# Patient Record
Sex: Female | Born: 1960 | Race: Black or African American | Hispanic: No | Marital: Single | Smoking: Current every day smoker
Health system: Southern US, Community
[De-identification: ages and names within clinical notes are randomized; demographics above are authoritative.]

## PROBLEM LIST (undated history)

## (undated) DIAGNOSIS — E119 Type 2 diabetes mellitus without complications: Secondary | ICD-10-CM

## (undated) DIAGNOSIS — F1410 Cocaine abuse, uncomplicated: Secondary | ICD-10-CM

## (undated) DIAGNOSIS — M1990 Unspecified osteoarthritis, unspecified site: Secondary | ICD-10-CM

## (undated) DIAGNOSIS — F39 Unspecified mood [affective] disorder: Secondary | ICD-10-CM

## (undated) DIAGNOSIS — E039 Hypothyroidism, unspecified: Secondary | ICD-10-CM

## (undated) DIAGNOSIS — F1010 Alcohol abuse, uncomplicated: Secondary | ICD-10-CM

## (undated) DIAGNOSIS — F32A Depression, unspecified: Secondary | ICD-10-CM

## (undated) HISTORY — DX: Hypothyroidism, unspecified: E03.9

## (undated) HISTORY — DX: Unspecified mood (affective) disorder: F39

## (undated) HISTORY — DX: Cocaine abuse, uncomplicated: F14.10

## (undated) HISTORY — PX: OTHER SURGICAL HISTORY: SHX169

## (undated) HISTORY — DX: Alcohol abuse, uncomplicated: F10.10

---

## 1999-07-25 ENCOUNTER — Encounter: Admission: RE | Admit: 1999-07-25 | Discharge: 1999-07-25 | Payer: Self-pay | Admitting: Hematology and Oncology

## 2000-09-26 ENCOUNTER — Encounter: Admission: RE | Admit: 2000-09-26 | Discharge: 2000-09-26 | Payer: Self-pay | Admitting: Internal Medicine

## 2000-12-13 ENCOUNTER — Encounter: Admission: RE | Admit: 2000-12-13 | Discharge: 2000-12-13 | Payer: Self-pay | Admitting: Internal Medicine

## 2000-12-20 ENCOUNTER — Encounter: Admission: RE | Admit: 2000-12-20 | Discharge: 2000-12-20 | Payer: Self-pay | Admitting: Internal Medicine

## 2002-02-24 ENCOUNTER — Encounter: Admission: RE | Admit: 2002-02-24 | Discharge: 2002-02-24 | Payer: Self-pay | Admitting: Internal Medicine

## 2004-01-05 ENCOUNTER — Emergency Department (HOSPITAL_COMMUNITY): Admission: EM | Admit: 2004-01-05 | Discharge: 2004-01-05 | Payer: Self-pay | Admitting: Emergency Medicine

## 2004-06-28 ENCOUNTER — Emergency Department (HOSPITAL_COMMUNITY): Admission: EM | Admit: 2004-06-28 | Discharge: 2004-06-28 | Payer: Self-pay | Admitting: Emergency Medicine

## 2005-03-30 ENCOUNTER — Ambulatory Visit: Payer: Self-pay | Admitting: Internal Medicine

## 2005-07-13 ENCOUNTER — Inpatient Hospital Stay (HOSPITAL_COMMUNITY): Admission: EM | Admit: 2005-07-13 | Discharge: 2005-07-15 | Payer: Self-pay | Admitting: *Deleted

## 2005-08-15 ENCOUNTER — Ambulatory Visit: Payer: Self-pay | Admitting: Internal Medicine

## 2005-11-30 ENCOUNTER — Ambulatory Visit: Payer: Self-pay | Admitting: Internal Medicine

## 2006-03-28 ENCOUNTER — Ambulatory Visit: Payer: Self-pay | Admitting: Internal Medicine

## 2006-04-10 ENCOUNTER — Ambulatory Visit: Payer: Self-pay | Admitting: Internal Medicine

## 2006-04-12 DIAGNOSIS — R7611 Nonspecific reaction to tuberculin skin test without active tuberculosis: Secondary | ICD-10-CM

## 2006-04-12 DIAGNOSIS — F17200 Nicotine dependence, unspecified, uncomplicated: Secondary | ICD-10-CM

## 2006-04-12 DIAGNOSIS — R634 Abnormal weight loss: Secondary | ICD-10-CM

## 2006-04-12 DIAGNOSIS — E890 Postprocedural hypothyroidism: Secondary | ICD-10-CM | POA: Insufficient documentation

## 2006-06-03 ENCOUNTER — Ambulatory Visit: Payer: Self-pay | Admitting: Internal Medicine

## 2006-09-03 ENCOUNTER — Encounter (INDEPENDENT_AMBULATORY_CARE_PROVIDER_SITE_OTHER): Payer: Self-pay | Admitting: Internal Medicine

## 2006-09-03 ENCOUNTER — Ambulatory Visit: Payer: Self-pay | Admitting: Hospitalist

## 2006-09-03 LAB — CONVERTED CEMR LAB: Beta hcg, urine, semiquantitative: NEGATIVE

## 2006-09-04 ENCOUNTER — Telehealth (INDEPENDENT_AMBULATORY_CARE_PROVIDER_SITE_OTHER): Payer: Self-pay | Admitting: *Deleted

## 2006-09-04 LAB — CONVERTED CEMR LAB
ALT: 37 units/L — ABNORMAL HIGH (ref 0–35)
AST: 44 units/L — ABNORMAL HIGH (ref 0–37)
Albumin: 3.9 g/dL (ref 3.5–5.2)
Alkaline Phosphatase: 66 units/L (ref 39–117)
BUN: 8 mg/dL (ref 6–23)
CO2: 30 meq/L (ref 19–32)
Calcium: 9.3 mg/dL (ref 8.4–10.5)
Chloride: 102 meq/L (ref 96–112)
Creatinine, Ser: 0.59 mg/dL (ref 0.40–1.20)
Glucose, Bld: 75 mg/dL (ref 70–99)
HCT: 37.8 % (ref 36.0–46.0)
Hemoglobin: 12.6 g/dL (ref 12.0–15.0)
INR: 1.1 (ref 0.0–1.5)
MCHC: 33.3 g/dL (ref 30.0–36.0)
MCV: 86.6 fL (ref 78.0–100.0)
Platelets: 291 10*3/uL (ref 150–400)
Potassium: 4 meq/L (ref 3.5–5.3)
Prothrombin Time: 13.9 s (ref 11.6–15.2)
RBC: 4.36 M/uL (ref 3.87–5.11)
RDW: 12.3 % (ref 11.5–14.0)
Sodium: 141 meq/L (ref 135–145)
TSH: 0.017 microintl units/mL — ABNORMAL LOW (ref 0.350–5.50)
Total Bilirubin: 0.8 mg/dL (ref 0.3–1.2)
Total Protein: 6.8 g/dL (ref 6.0–8.3)
WBC: 12.2 10*3/uL — ABNORMAL HIGH (ref 4.0–10.5)
aPTT: 36 s (ref 24–37)

## 2006-12-10 ENCOUNTER — Telehealth: Payer: Self-pay | Admitting: *Deleted

## 2007-01-09 ENCOUNTER — Encounter: Payer: Self-pay | Admitting: Internal Medicine

## 2007-01-09 ENCOUNTER — Ambulatory Visit: Payer: Self-pay | Admitting: Internal Medicine

## 2007-01-09 LAB — CONVERTED CEMR LAB: TSH: 0.777 microintl units/mL (ref 0.350–5.50)

## 2007-01-21 ENCOUNTER — Telehealth: Payer: Self-pay | Admitting: *Deleted

## 2007-01-21 ENCOUNTER — Emergency Department (HOSPITAL_COMMUNITY): Admission: EM | Admit: 2007-01-21 | Discharge: 2007-01-21 | Payer: Self-pay | Admitting: Emergency Medicine

## 2007-01-29 ENCOUNTER — Ambulatory Visit: Payer: Self-pay | Admitting: *Deleted

## 2007-01-29 ENCOUNTER — Encounter (INDEPENDENT_AMBULATORY_CARE_PROVIDER_SITE_OTHER): Payer: Self-pay | Admitting: Internal Medicine

## 2007-05-21 ENCOUNTER — Encounter (INDEPENDENT_AMBULATORY_CARE_PROVIDER_SITE_OTHER): Payer: Self-pay | Admitting: Internal Medicine

## 2007-05-21 ENCOUNTER — Ambulatory Visit: Payer: Self-pay | Admitting: *Deleted

## 2007-05-22 LAB — CONVERTED CEMR LAB
ALT: 29 units/L (ref 0–35)
AST: 18 units/L (ref 0–37)
Albumin: 4.7 g/dL (ref 3.5–5.2)
Alkaline Phosphatase: 57 units/L (ref 39–117)
BUN: 13 mg/dL (ref 6–23)
CO2: 26 meq/L (ref 19–32)
Calcium: 9.6 mg/dL (ref 8.4–10.5)
Chloride: 105 meq/L (ref 96–112)
Cholesterol: 155 mg/dL (ref 0–200)
Creatinine, Ser: 0.68 mg/dL (ref 0.40–1.20)
Glucose, Bld: 56 mg/dL — ABNORMAL LOW (ref 70–99)
HDL: 69 mg/dL (ref >39)
LDL Cholesterol: 73 mg/dL (ref 0–99)
Potassium: 4.5 meq/L (ref 3.5–5.3)
Sodium: 144 meq/L (ref 135–145)
TSH: 0.379 microintl units/mL (ref 0.350–5.50)
Total Bilirubin: 0.5 mg/dL (ref 0.3–1.2)
Total CHOL/HDL Ratio: 2.2
Total Protein: 7.4 g/dL (ref 6.0–8.3)
Triglycerides: 63 mg/dL (ref <150)
VLDL: 13 mg/dL (ref 0–40)

## 2007-06-10 ENCOUNTER — Telehealth (INDEPENDENT_AMBULATORY_CARE_PROVIDER_SITE_OTHER): Payer: Self-pay | Admitting: Internal Medicine

## 2007-09-18 ENCOUNTER — Ambulatory Visit: Payer: Self-pay | Admitting: Internal Medicine

## 2007-09-18 ENCOUNTER — Encounter (INDEPENDENT_AMBULATORY_CARE_PROVIDER_SITE_OTHER): Payer: Self-pay | Admitting: Internal Medicine

## 2007-09-18 LAB — CONVERTED CEMR LAB: TSH: 0.724 microintl units/mL (ref 0.350–5.50)

## 2007-12-22 ENCOUNTER — Encounter (INDEPENDENT_AMBULATORY_CARE_PROVIDER_SITE_OTHER): Payer: Self-pay | Admitting: Internal Medicine

## 2008-07-07 ENCOUNTER — Encounter (INDEPENDENT_AMBULATORY_CARE_PROVIDER_SITE_OTHER): Payer: Self-pay | Admitting: Internal Medicine

## 2008-09-24 ENCOUNTER — Encounter (INDEPENDENT_AMBULATORY_CARE_PROVIDER_SITE_OTHER): Payer: Self-pay | Admitting: Internal Medicine

## 2008-09-24 ENCOUNTER — Ambulatory Visit: Payer: Self-pay | Admitting: *Deleted

## 2008-09-27 LAB — CONVERTED CEMR LAB
ALT: 26 units/L (ref 0–35)
AST: 37 units/L (ref 0–37)
Albumin: 4.6 g/dL (ref 3.5–5.2)
Alkaline Phosphatase: 77 units/L (ref 39–117)
BUN: 12 mg/dL (ref 6–23)
CO2: 22 meq/L (ref 19–32)
Calcium: 9.1 mg/dL (ref 8.4–10.5)
Chloride: 109 meq/L (ref 96–112)
Cholesterol: 161 mg/dL (ref 0–200)
Creatinine, Ser: 0.87 mg/dL (ref 0.40–1.20)
GFR calc Af Amer: >60 mL/min (ref >60)
GFR calc non Af Amer: >60 mL/min (ref >60)
Glucose, Bld: 91 mg/dL (ref 70–99)
HDL: 59 mg/dL (ref >39)
LDL Cholesterol: 85 mg/dL (ref 0–99)
Potassium: 4 meq/L (ref 3.5–5.3)
Sodium: 147 meq/L — ABNORMAL HIGH (ref 135–145)
TSH: 1.434 microintl units/mL (ref 0.350–4.500)
Total Bilirubin: 0.4 mg/dL (ref 0.3–1.2)
Total CHOL/HDL Ratio: 2.7
Total Protein: 7.6 g/dL (ref 6.0–8.3)
Triglycerides: 84 mg/dL (ref <150)
VLDL: 17 mg/dL (ref 0–40)

## 2008-11-30 ENCOUNTER — Encounter (INDEPENDENT_AMBULATORY_CARE_PROVIDER_SITE_OTHER): Payer: Self-pay | Admitting: Internal Medicine

## 2008-12-04 ENCOUNTER — Emergency Department (HOSPITAL_COMMUNITY): Admission: EM | Admit: 2008-12-04 | Discharge: 2008-12-04 | Payer: Self-pay | Admitting: Emergency Medicine

## 2008-12-06 ENCOUNTER — Encounter (INDEPENDENT_AMBULATORY_CARE_PROVIDER_SITE_OTHER): Payer: Self-pay | Admitting: Internal Medicine

## 2008-12-16 ENCOUNTER — Emergency Department (HOSPITAL_COMMUNITY): Admission: EM | Admit: 2008-12-16 | Discharge: 2008-12-16 | Payer: Self-pay | Admitting: Emergency Medicine

## 2008-12-16 ENCOUNTER — Encounter: Payer: Self-pay | Admitting: Licensed Clinical Social Worker

## 2008-12-25 ENCOUNTER — Encounter (INDEPENDENT_AMBULATORY_CARE_PROVIDER_SITE_OTHER): Payer: Self-pay | Admitting: Internal Medicine

## 2008-12-25 ENCOUNTER — Ambulatory Visit: Payer: Self-pay | Admitting: *Deleted

## 2008-12-25 ENCOUNTER — Inpatient Hospital Stay (HOSPITAL_COMMUNITY): Admission: EM | Admit: 2008-12-25 | Discharge: 2008-12-27 | Payer: Self-pay | Admitting: Emergency Medicine

## 2008-12-27 ENCOUNTER — Encounter: Payer: Self-pay | Admitting: Internal Medicine

## 2009-02-21 ENCOUNTER — Emergency Department (HOSPITAL_COMMUNITY): Admission: EM | Admit: 2009-02-21 | Discharge: 2009-02-21 | Payer: Self-pay | Admitting: Emergency Medicine

## 2009-10-06 ENCOUNTER — Telehealth: Payer: Self-pay | Admitting: Internal Medicine

## 2010-02-13 ENCOUNTER — Ambulatory Visit: Payer: Self-pay | Admitting: Internal Medicine

## 2010-02-13 DIAGNOSIS — N898 Other specified noninflammatory disorders of vagina: Secondary | ICD-10-CM | POA: Insufficient documentation

## 2010-02-13 DIAGNOSIS — C519 Malignant neoplasm of vulva, unspecified: Secondary | ICD-10-CM | POA: Insufficient documentation

## 2010-02-13 LAB — CONVERTED CEMR LAB
Candida species: NEGATIVE
Gardnerella vaginalis: POSITIVE — AB
Pap Smear: NEGATIVE
TSH: 1.013 microintl units/mL (ref 0.350–4.5)

## 2010-02-15 ENCOUNTER — Telehealth: Payer: Self-pay | Admitting: *Deleted

## 2010-02-15 ENCOUNTER — Encounter: Payer: Self-pay | Admitting: Internal Medicine

## 2010-02-15 ENCOUNTER — Telehealth: Payer: Self-pay | Admitting: Licensed Clinical Social Worker

## 2010-02-15 LAB — CONVERTED CEMR LAB
Chlamydia, DNA Probe: NEGATIVE
GC Probe Amp, Genital: NEGATIVE

## 2010-02-21 ENCOUNTER — Ambulatory Visit: Admission: RE | Admit: 2010-02-21 | Discharge: 2010-02-21 | Payer: Self-pay | Admitting: Gynecologic Oncology

## 2010-03-14 ENCOUNTER — Ambulatory Visit (HOSPITAL_COMMUNITY): Admission: RE | Admit: 2010-03-14 | Discharge: 2010-03-14 | Payer: Self-pay | Admitting: Gynecologic Oncology

## 2010-07-05 ENCOUNTER — Ambulatory Visit
Admission: RE | Admit: 2010-07-05 | Discharge: 2010-07-05 | Payer: Self-pay | Source: Home / Self Care | Attending: Gynecologic Oncology | Admitting: Gynecologic Oncology

## 2010-07-12 ENCOUNTER — Ambulatory Visit (HOSPITAL_COMMUNITY)
Admission: RE | Admit: 2010-07-12 | Discharge: 2010-07-12 | Payer: Self-pay | Source: Home / Self Care | Attending: Gynecologic Oncology | Admitting: Gynecologic Oncology

## 2010-07-17 LAB — GLUCOSE, CAPILLARY: Glucose-Capillary: 83 mg/dL (ref 70–99)

## 2010-08-03 NOTE — Miscellaneous (Signed)
Summary: Orders Update  Clinical Lists Changes  Orders: Added new Referral order of Social Work Referral (Social ) - Signed 

## 2010-08-03 NOTE — Progress Notes (Signed)
Summary: Soc. 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  I gave her the phone number for [facility] to set up an appointment and advised her that I would call next week to make sure she got appmt.

## 2010-08-03 NOTE — Progress Notes (Signed)
Summary: RTC/ hla  Phone Note Call from Patient   Summary of Call: pt left message to call her, i called back and she stated someone had already called her, ask if she had any questions, she said no, very odd conversation??? Initial call taken by: Marin Roberts RN,  February 15, 2010 5:05 PM
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
## 2010-08-03 NOTE — Progress Notes (Signed)
Summary: refill/gg  Phone Note Refill Request  on October 06, 2009 11:23 AM  Refills Requested: Medication #1:  LEVOTHROID 50 MCG TABS take one tab by mouth once daily.   Last Refilled: 08/27/2009  Method Requested: Electronic Initial call taken by: Merrie Roof RN,  October 06, 2009 11:24 AM  Follow-up for Phone Call        Refill approved-nurse to complete    Prescriptions: LEVOTHROID 50 MCG TABS (LEVOTHYROXINE SODIUM) take one tab by mouth once daily  #31 x 6   Entered and Authorized by:   Vassie Loll MD   Signed by:   Vassie Loll MD on 10/06/2009   Method used:   Electronically to        [facility] [phone]* (retail)       [address]       [place], [place]  [address]       Ph: [phone]       Fax: [phone]   RxID:   [id number]

## 2010-08-18 ENCOUNTER — Other Ambulatory Visit: Payer: Self-pay | Admitting: *Deleted

## 2010-08-21 MED ORDER — LEVOTHYROXINE SODIUM 50 MCG PO TABS: 50.0000 ug | ORAL_TABLET | Freq: Every day | ORAL | Status: DC

## 2010-09-14 LAB — DIFFERENTIAL
Basophils Absolute: 0 10*3/uL (ref 0.0–0.1)
Basophils Relative: 0 % (ref 0–1)
Eosinophils Absolute: 0.1 10*3/uL (ref 0.0–0.7)
Eosinophils Relative: 2 % (ref 0–5)
Lymphocytes Relative: 37 % (ref 12–46)
Lymphs Abs: 2.3 10*3/uL (ref 0.7–4.0)
Monocytes Absolute: 0.3 10*3/uL (ref 0.1–1.0)
Monocytes Relative: 4 % (ref 3–12)
Neutro Abs: 3.5 10*3/uL (ref 1.7–7.7)
Neutrophils Relative %: 57 % (ref 43–77)

## 2010-09-14 LAB — RAPID URINE DRUG SCREEN, HOSP PERFORMED
Amphetamines: NOT DETECTED
Barbiturates: NOT DETECTED
Benzodiazepines: NOT DETECTED
Cocaine: NOT DETECTED
Opiates: NOT DETECTED
Tetrahydrocannabinol: NOT DETECTED

## 2010-09-14 LAB — CBC
HCT: 39.1 % (ref 36.0–46.0)
Hemoglobin: 13.2 g/dL (ref 12.0–15.0)
MCH: 29.5 pg (ref 26.0–34.0)
MCHC: 33.8 g/dL (ref 30.0–36.0)
MCV: 87.3 fL (ref 78.0–100.0)
Platelets: 261 10*3/uL (ref 150–400)
RBC: 4.48 MIL/uL (ref 3.87–5.11)
RDW: 12.8 % (ref 11.5–15.5)
WBC: 6.2 10*3/uL (ref 4.0–10.5)

## 2010-09-14 LAB — BASIC METABOLIC PANEL
BUN: 5 mg/dL — ABNORMAL LOW (ref 6–23)
CO2: 29 mEq/L (ref 19–32)
Calcium: 9.3 mg/dL (ref 8.4–10.5)
Chloride: 109 mEq/L (ref 96–112)
Creatinine, Ser: 0.66 mg/dL (ref 0.4–1.2)
GFR calc Af Amer: >60 mL/min (ref >60)
GFR calc non Af Amer: >60 mL/min (ref >60)
Glucose, Bld: 85 mg/dL (ref 70–99)
Potassium: 3.3 mEq/L — ABNORMAL LOW (ref 3.5–5.1)
Sodium: 145 mEq/L (ref 135–145)

## 2010-09-14 LAB — SURGICAL PCR SCREEN
MRSA, PCR: POSITIVE — AB
Staphylococcus aureus: POSITIVE — AB

## 2010-10-09 LAB — CBC
HCT: 31.7 % — ABNORMAL LOW (ref 36.0–46.0)
HCT: 39.3 % (ref 36.0–46.0)
Hemoglobin: 10.5 g/dL — ABNORMAL LOW (ref 12.0–15.0)
Hemoglobin: 10.8 g/dL — ABNORMAL LOW (ref 12.0–15.0)
Hemoglobin: 13.1 g/dL (ref 12.0–15.0)
MCHC: 33.3 g/dL (ref 30.0–36.0)
MCHC: 33.3 g/dL (ref 30.0–36.0)
MCV: 87.8 fL (ref 78.0–100.0)
MCV: 88.2 fL (ref 78.0–100.0)
MCV: 88.8 fL (ref 78.0–100.0)
Platelets: 309 10*3/uL (ref 150–400)
RBC: 3.57 MIL/uL — ABNORMAL LOW (ref 3.87–5.11)
RBC: 3.69 MIL/uL — ABNORMAL LOW (ref 3.87–5.11)
RBC: 4.48 MIL/uL (ref 3.87–5.11)
RDW: 13.3 % (ref 11.5–15.5)
WBC: 10.1 10*3/uL (ref 4.0–10.5)
WBC: 5.4 10*3/uL (ref 4.0–10.5)
WBC: 6.7 10*3/uL (ref 4.0–10.5)

## 2010-10-09 LAB — URINALYSIS, ROUTINE W REFLEX MICROSCOPIC
Bilirubin Urine: NEGATIVE
Nitrite: NEGATIVE
Nitrite: NEGATIVE
Protein, ur: NEGATIVE mg/dL
Specific Gravity, Urine: 1.01 (ref 1.005–1.030)
Urobilinogen, UA: 0.2 mg/dL (ref 0.0–1.0)
Urobilinogen, UA: 0.2 mg/dL (ref 0.0–1.0)
pH: 6.5 (ref 5.0–8.0)

## 2010-10-09 LAB — COMPREHENSIVE METABOLIC PANEL
ALT: 26 U/L (ref 0–35)
AST: 23 U/L (ref 0–37)
CO2: 25 mEq/L (ref 19–32)
Chloride: 104 mEq/L (ref 96–112)
GFR calc Af Amer: >60 mL/min (ref >60)
GFR calc non Af Amer: >60 mL/min (ref >60)
Glucose, Bld: 103 mg/dL — ABNORMAL HIGH (ref 70–99)
Sodium: 136 mEq/L (ref 135–145)
Total Bilirubin: 0.5 mg/dL (ref 0.3–1.2)

## 2010-10-09 LAB — SYNOVIAL CELL COUNT + DIFF, W/ CRYSTALS
Crystals, Fluid: NONE SEEN
WBC, Synovial: UNDETERMINED /mm3 (ref 0–200)

## 2010-10-09 LAB — POCT I-STAT, CHEM 8
BUN: 7 mg/dL (ref 6–23)
Calcium, Ion: 1.18 mmol/L (ref 1.12–1.32)
HCT: 41 % (ref 36.0–46.0)
Hemoglobin: 13.9 g/dL (ref 12.0–15.0)
Sodium: 140 mEq/L (ref 135–145)
TCO2: 26 mmol/L (ref 0–100)

## 2010-10-09 LAB — ETHANOL
Alcohol, Ethyl (B): <5 mg/dL (ref 0–10)
Alcohol, Ethyl (B): 225 mg/dL — ABNORMAL HIGH (ref 0–10)

## 2010-10-09 LAB — DIFFERENTIAL
Basophils Absolute: 0 10*3/uL (ref 0.0–0.1)
Basophils Relative: 0 % (ref 0–1)
Eosinophils Absolute: 0 10*3/uL (ref 0.0–0.7)
Eosinophils Relative: 0 % (ref 0–5)
Lymphocytes Relative: 19 % (ref 12–46)
Lymphs Abs: 1.9 10*3/uL (ref 0.7–4.0)
Monocytes Absolute: 0.5 10*3/uL (ref 0.1–1.0)
Monocytes Relative: 5 % (ref 3–12)
Neutro Abs: 7.6 10*3/uL (ref 1.7–7.7)
Neutrophils Relative %: 75 % (ref 43–77)

## 2010-10-09 LAB — SEDIMENTATION RATE: Sed Rate: 44 mm/hr — ABNORMAL HIGH (ref 0–22)

## 2010-10-09 LAB — BASIC METABOLIC PANEL
BUN: 7 mg/dL (ref 6–23)
Chloride: 108 mEq/L (ref 96–112)
GFR calc non Af Amer: >60 mL/min (ref >60)
Potassium: 2.8 mEq/L — ABNORMAL LOW (ref 3.5–5.1)
Sodium: 144 mEq/L (ref 135–145)

## 2010-10-09 LAB — LIPID PANEL: VLDL: 9 mg/dL (ref 0–40)

## 2010-10-09 LAB — IRON AND TIBC
Iron: 26 ug/dL — ABNORMAL LOW (ref 42–135)
Saturation Ratios: 9 % — ABNORMAL LOW (ref 20–55)
TIBC: 283 ug/dL (ref 250–470)
UIBC: 257 ug/dL

## 2010-10-09 LAB — RAPID URINE DRUG SCREEN, HOSP PERFORMED
Amphetamines: NOT DETECTED
Barbiturates: NOT DETECTED
Benzodiazepines: NOT DETECTED
Cocaine: POSITIVE — AB
Opiates: POSITIVE — AB
Tetrahydrocannabinol: NOT DETECTED
Tetrahydrocannabinol: NOT DETECTED

## 2010-10-09 LAB — WOUND CULTURE

## 2010-10-09 LAB — CULTURE, BLOOD (ROUTINE X 2)

## 2010-10-09 LAB — T4, FREE: Free T4: 1.07 ng/dL (ref 0.80–1.80)

## 2010-10-09 LAB — BODY FLUID CULTURE: Gram Stain: NONE SEEN

## 2010-10-09 LAB — URINE MICROSCOPIC-ADD ON

## 2010-10-09 LAB — TSH: TSH: 0.331 u[IU]/mL — ABNORMAL LOW (ref 0.350–4.500)

## 2010-10-09 LAB — VITAMIN B12: Vitamin B-12: 488 pg/mL (ref 211–911)

## 2010-10-18 ENCOUNTER — Ambulatory Visit: Payer: Self-pay | Admitting: Gynecologic Oncology
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
## 2010-11-21 ENCOUNTER — Encounter: Payer: Self-pay | Admitting: Internal Medicine

## 2010-11-23 ENCOUNTER — Encounter: Payer: Self-pay | Admitting: Internal Medicine

## 2010-11-23 DIAGNOSIS — F1020 Alcohol dependence, uncomplicated: Secondary | ICD-10-CM | POA: Insufficient documentation

## 2010-11-23 DIAGNOSIS — E039 Hypothyroidism, unspecified: Secondary | ICD-10-CM | POA: Insufficient documentation

## 2010-11-23 DIAGNOSIS — F1010 Alcohol abuse, uncomplicated: Secondary | ICD-10-CM | POA: Insufficient documentation

## 2010-12-20 ENCOUNTER — Other Ambulatory Visit: Payer: Self-pay | Admitting: Gynecologic Oncology

## 2010-12-20 ENCOUNTER — Other Ambulatory Visit (HOSPITAL_COMMUNITY)
Admission: RE | Admit: 2010-12-20 | Discharge: 2010-12-20 | Disposition: A | Discharge status: Home / Self Care | Payer: Self-pay | Source: Ambulatory Visit | Attending: Gynecologic Oncology | Admitting: Gynecologic Oncology

## 2010-12-20 ENCOUNTER — Ambulatory Visit: Payer: Self-pay | Attending: Gynecologic Oncology | Admitting: Gynecologic Oncology

## 2010-12-20 DIAGNOSIS — Z8540 Personal history of malignant neoplasm of unspecified female genital organ: Secondary | ICD-10-CM | POA: Insufficient documentation

## 2010-12-20 DIAGNOSIS — C519 Malignant neoplasm of vulva, unspecified: Secondary | ICD-10-CM | POA: Insufficient documentation

## 2010-12-21 NOTE — Consult Note (Signed)
NAMEMEMORY, HEINRICHS              ACCOUNT NO.:  [id number]  MEDICAL RECORD NO.:  [id number]  LOCATION:  GYN                          FACILITY:  [facility]  PHYSICIAN:  Mylen Mangan A. Duard Brady, MD    DATE OF BIRTH:  [birth year]  DATE OF CONSULTATION:  12/20/2010 DATE OF DISCHARGE:                                CONSULTATION   [name] is a 50 year old with a stage IB vulvar carcinoma.  She initially presented to [place] in August 2011 with a 2 x 3 cm fungating mass on the labia minora.  The patient refused biopsy, underwent radical vulvectomy in September 2011.  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  Depth of invasion was 8 mm.  There was no LVSI.  The patient was scheduled to undergo a PET scan and bilateral groin node dissection, however, she was incarcerated.  We did make contact with the correctional facility to see if we could proceed with surgery, but it was not approved.  She subsequently got out of the correctional facility approximately 6 months later to re-establish care.  We got a PET scan at that time in January 2012 that revealed no evidence of metastatic disease and the discussion was to proceed with interval followup as the benefit of bilateral groin dissection 6 months after her initial surgery is somewhat limited.  She comes in today for followup.  She is overall doing quite well.  She continues on disability.  Denies any bleeding or soreness or pain.  She continues to occasionally drink some alcohol.  She has used crack.  She otherwise has no complaints.  REVIEW OF SYSTEMS:  She denies any chest pain, short of breath, nausea, vomiting, fever, chills, headaches, visual changes, unintentional weight loss or weight gain.  MEDICATIONS:  Synthroid.  PHYSICAL EXAMINATION:  VITAL SIGNS:  Weight 141 pounds, blood pressure 110/60, respirations 14, temperature 97.6. GENERAL:  A well-nourished, well-developed female,  in no acute distress. NECK:  Supple.  There is no lymphadenopathy, no thyromegaly. LUNGS:  Clear to auscultation bilaterally. CARDIOVASCULAR:  Regular rate and rhythm. ABDOMEN:  Soft, nontender, nondistended.  There are no palpable masses or hepatosplenomegaly.  Groins are negative for adenopathy. EXTREMITIES:  There is no edema. PELVIC:  External genitalia is notable for surgical excision of the labia minora and clitoris.  The area is otherwise well healed.  There are no visible lesions.  The vagina is somewhat atrophic.  Cervix is visualized, it is nulliparous.  There are no visible lesions.  ThinPrep Pap was submitted without difficulty.  Bimanual examination of the corpus is of normal size, shape, consistency.  There were no adnexal masses.  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  PLAN: 1. We will follow up the results for Pap smear from today. 2. She will return to see me in 4 months and will follow up with her     other physicians as scheduled.     Tykisha Areola A. Duard Brady, MD     PAG/MEDQ  D:  12/20/2010  T:  12/21/2010  Job:  [id number]  cc:  Telford Nab, R.N. [address] [place], [place] [address]  Tilford Pillar, MD  Primary physician [facility]  Electronically Signed by Cleda Mccreedy MD on 12/21/2010 02:53:27 PM

## 2011-04-16 LAB — URINALYSIS, ROUTINE W REFLEX MICROSCOPIC
Hgb urine dipstick: NEGATIVE
Nitrite: NEGATIVE
Protein, ur: NEGATIVE
Specific Gravity, Urine: 1.022
Urobilinogen, UA: 0.2

## 2011-04-16 LAB — POCT PREGNANCY, URINE: Preg Test, Ur: NEGATIVE

## 2011-04-16 LAB — RAPID URINE DRUG SCREEN, HOSP PERFORMED
Barbiturates: NOT DETECTED
Opiates: NOT DETECTED

## 2011-04-18 ENCOUNTER — Ambulatory Visit: Payer: Self-pay | Admitting: Gynecologic Oncology

## 2011-06-13 ENCOUNTER — Ambulatory Visit: Payer: Self-pay | Admitting: Gynecologic Oncology

## 2011-09-03 ENCOUNTER — Other Ambulatory Visit: Payer: Self-pay | Admitting: Internal Medicine

## 2011-09-04 ENCOUNTER — Other Ambulatory Visit: Payer: Self-pay | Admitting: *Deleted

## 2011-09-12 ENCOUNTER — Ambulatory Visit (INDEPENDENT_AMBULATORY_CARE_PROVIDER_SITE_OTHER): Payer: Self-pay | Admitting: Internal Medicine

## 2011-09-12 ENCOUNTER — Encounter: Payer: Self-pay | Admitting: Internal Medicine

## 2011-09-12 VITALS — BP 149/86 | HR 113 | Temp 97.8°F | Ht 62.0 in | Wt 150.0 lb

## 2011-09-12 DIAGNOSIS — E039 Hypothyroidism, unspecified: Secondary | ICD-10-CM

## 2011-09-12 LAB — COMPLETE METABOLIC PANEL WITH GFR
ALT: 39 U/L — ABNORMAL HIGH (ref 0–35)
Albumin: 4.7 g/dL (ref 3.5–5.2)
Alkaline Phosphatase: 80 U/L (ref 39–117)
CO2: 25 mEq/L (ref 19–32)
Calcium: 9.7 mg/dL (ref 8.4–10.5)
Chloride: 106 mEq/L (ref 96–112)
GFR, Est African American: >89 mL/min
GFR, Est Non African American: >89 mL/min
Glucose, Bld: 96 mg/dL (ref 70–99)
Potassium: 4.3 mEq/L (ref 3.5–5.3)
Sodium: 141 mEq/L (ref 135–145)
Total Bilirubin: 0.3 mg/dL (ref 0.3–1.2)
Total Protein: 7.2 g/dL (ref 6.0–8.3)

## 2011-09-12 LAB — CBC
HCT: 39.2 % (ref 36.0–46.0)
MCV: 87.7 fL (ref 78.0–100.0)
Platelets: 331 10*3/uL (ref 150–400)
RBC: 4.47 MIL/uL (ref 3.87–5.11)
RDW: 14.4 % (ref 11.5–15.5)
WBC: 15.7 10*3/uL — ABNORMAL HIGH (ref 4.0–10.5)

## 2011-09-12 LAB — TSH: TSH: 5.664 u[IU]/mL — ABNORMAL HIGH (ref 0.350–4.500)

## 2011-09-12 LAB — LIPID PANEL
HDL: 66 mg/dL (ref >39)
LDL Cholesterol: 112 mg/dL — ABNORMAL HIGH (ref 0–99)
VLDL: 13 mg/dL (ref 0–40)

## 2011-09-12 MED ORDER — LEVOTHYROXINE SODIUM 50 MCG PO TABS: 50.0000 ug | ORAL_TABLET | Freq: Every day | ORAL | Status: DC

## 2011-09-12 NOTE — Assessment & Plan Note (Signed)
Last TSH was 1.01 in August of 2011.  She was tachycardic in 113 on arrival but repeat was 101.   -Will refill Levothyroxine 50 mcg for now until lab results come back -Get TSH level today

## 2011-09-12 NOTE — Patient Instructions (Signed)
Resume taking Levothyroxine 50 mcg one tablet every morning. Get labs today and I will call you with any abnormal lab results and medication adjustment if neeeded Follow up in 6 months

## 2011-09-12 NOTE — Progress Notes (Signed)
HPI: [name] is a 51 yo W with PMH of hypothyroidism presents today for follow up.  She ran out of her meds for about 1 weeks.  Gaining weight from 113 to 150 pounds since 2011. Denies any dry skin, brittle, depression.  + good appetite.  She has been taking Levothyroxine for many years after radiation.  No other complaints. Denies illicits/alcohol.  ROS: as per HPI  PE: General: alert, well-developed, and cooperative to examination.  Neck: supple, full ROM, no thyromegaly, no JVD Lungs: normal respiratory effort, no accessory muscle use, normal breath sounds, no crackles, and no wheezes. Heart: normal rate, regular rhythm, no murmur, no gallop, and no rub.  Abdomen: soft, non-tender, normal bowel sounds, no distention, no guarding, no rebound tenderness Msk: no joint swelling, no joint warmth, and no redness over joints.  Pulses: 2+ DP/PT pulses bilaterally Extremities: No cyanosis, clubbing, edema Neurologic: alert & oriented X3, cranial nerves II-XII intact, strength normal in all extremities, sensation intact to light touch, and gait normal.

## 2012-01-07 ENCOUNTER — Other Ambulatory Visit: Payer: Self-pay | Admitting: *Deleted

## 2012-01-07 NOTE — Telephone Encounter (Signed)
Pt states dosage was increased to .

## 2012-01-08 MED ORDER — LEVOTHYROXINE SODIUM 50 MCG PO TABS: 75.0000 ug | ORAL_TABLET | Freq: Every day | ORAL | Status: DC

## 2012-01-08 NOTE — Telephone Encounter (Signed)
Patient will need to come back so we can repeat her TSH and free T4 before I refill her meds.  Will refill 1 month supply until she can come for office visit/labwork. Last labs were abnormal so we need to make sure that the of Levothyroxine is adequate for her hypothyroidism.

## 2012-01-08 NOTE — Telephone Encounter (Signed)
Patient will need to come in for TSH/free T4

## 2012-01-09 NOTE — Telephone Encounter (Signed)
Appt scheduled 7/15 for labs.

## 2012-01-10 ENCOUNTER — Other Ambulatory Visit: Payer: Self-pay | Admitting: Internal Medicine

## 2012-01-10 DIAGNOSIS — E039 Hypothyroidism, unspecified: Secondary | ICD-10-CM

## 2012-01-21 ENCOUNTER — Other Ambulatory Visit (INDEPENDENT_AMBULATORY_CARE_PROVIDER_SITE_OTHER): Payer: Self-pay

## 2012-01-21 DIAGNOSIS — E039 Hypothyroidism, unspecified: Secondary | ICD-10-CM

## 2012-01-21 LAB — T4, FREE: Free T4: 1.38 ng/dL (ref 0.80–1.80)

## 2012-02-04 ENCOUNTER — Other Ambulatory Visit: Payer: Self-pay | Admitting: *Deleted

## 2012-02-04 MED ORDER — LEVOTHYROXINE SODIUM 50 MCG PO TABS: 75.0000 ug | ORAL_TABLET | Freq: Every day | ORAL | Status: DC

## 2012-03-04 ENCOUNTER — Other Ambulatory Visit: Payer: Self-pay | Admitting: *Deleted

## 2012-03-04 MED ORDER — LEVOTHYROXINE SODIUM 50 MCG PO TABS: 75.0000 ug | ORAL_TABLET | Freq: Every day | ORAL | Status: DC

## 2012-04-08 ENCOUNTER — Other Ambulatory Visit: Payer: Self-pay | Admitting: *Deleted

## 2012-04-08 MED ORDER — LEVOTHYROXINE SODIUM 50 MCG PO TABS: 75.0000 ug | ORAL_TABLET | Freq: Every day | ORAL | Status: DC

## 2012-05-13 ENCOUNTER — Other Ambulatory Visit: Payer: Self-pay | Admitting: *Deleted

## 2012-05-13 MED ORDER — LEVOTHYROXINE SODIUM 50 MCG PO TABS: 75.0000 ug | ORAL_TABLET | Freq: Every day | ORAL | Status: DC

## 2012-09-02 ENCOUNTER — Other Ambulatory Visit: Payer: Self-pay | Admitting: *Deleted

## 2012-09-02 MED ORDER — LEVOTHYROXINE SODIUM 50 MCG PO TABS: 75.0000 ug | ORAL_TABLET | Freq: Every day | ORAL | Status: DC

## 2012-11-03 ENCOUNTER — Other Ambulatory Visit: Payer: Self-pay | Admitting: *Deleted

## 2012-11-04 MED ORDER — LEVOTHYROXINE SODIUM 50 MCG PO TABS: 75.0000 ug | ORAL_TABLET | Freq: Every day | ORAL | Status: DC

## 2012-11-04 NOTE — Telephone Encounter (Signed)
She will need blood work for her TSH soon in the next 1-3 months, her last TSH was in 12/2011.

## 2012-11-06 ENCOUNTER — Ambulatory Visit (INDEPENDENT_AMBULATORY_CARE_PROVIDER_SITE_OTHER): Payer: Self-pay | Admitting: Internal Medicine

## 2012-11-06 ENCOUNTER — Encounter: Payer: Self-pay | Admitting: Internal Medicine

## 2012-11-06 VITALS — BP 106/65 | HR 65 | Temp 97.4°F | Ht 62.25 in | Wt 146.6 lb

## 2012-11-06 DIAGNOSIS — E039 Hypothyroidism, unspecified: Secondary | ICD-10-CM

## 2012-11-06 LAB — TSH: TSH: 5.603 u[IU]/mL — ABNORMAL HIGH (ref 0.350–4.500)

## 2012-11-06 MED ORDER — LEVOTHYROXINE SODIUM 50 MCG PO TABS: 75.0000 ug | ORAL_TABLET | Freq: Every day | ORAL | Status: DC

## 2012-11-06 NOTE — Progress Notes (Signed)
I discussed this case with Dr. Patel soon after the patient visit. I have read the documentation and I agree with the plan of care. Please see the resident note for details of management.  

## 2012-11-06 NOTE — Progress Notes (Signed)
  Subjective:    Patient ID: [name], female    DOB: Aug 31, 1960, 52 y.o.   MRN: [id number]  HPI patient is a pleasant 52 year woman with hypothyroidism, and other problems as problem list who comes to the clinic for medication refill.  She was seen in our clinic last year for hypothyroidism. Her last TSH was 0.72 in July 2013. She was supposed to be on 75 mcg daily of Synthroid per her chart but she is taking 50 mcg daily. She feels perfectly all right. She denies any fever, chills, nausea vomiting abdominal pain, chest pain, diarrhea, headache, palpitations.    Review of Systems    as per history of present illness. Objective:   Physical Exam  General: NAD HEENT: PERRL, EOMI, no scleral icterus Cardiac: S1, S2, RRR, no rubs, murmurs or gallops Pulm: clear to auscultation bilaterally, moving normal volumes of air Abd: soft, nontender, nondistended, BS present Ext: warm and well perfused, no pedal edema Neuro: alert and oriented X3, cranial nerves II-XII grossly intact       Assessment & Plan:

## 2012-11-06 NOTE — Patient Instructions (Signed)
Please make a followup appointment as needed.  Get the lab test for thyroid done today. If anything needs to be changed, I will call you after that result.  Meanwhile start taking Synthroid 50 mcg one and half tablets daily. You will have refill with 45 pills every month.

## 2012-11-06 NOTE — Assessment & Plan Note (Signed)
Check TSH today. Last TSH 0.72 in July 2013.  Unclear if patient is taking prescribed dose of Synthroid.  - Prescription for 75 mcg daily of Synthroid sent to pharmacy.

## 2012-11-27 ENCOUNTER — Ambulatory Visit: Payer: Self-pay | Admitting: Internal Medicine

## 2013-11-30 ENCOUNTER — Other Ambulatory Visit: Payer: Self-pay | Admitting: *Deleted

## 2013-11-30 DIAGNOSIS — E039 Hypothyroidism, unspecified: Secondary | ICD-10-CM

## 2013-11-30 MED ORDER — LEVOTHYROXINE SODIUM 50 MCG PO TABS: 75.0000 ug | ORAL_TABLET | Freq: Every day | ORAL | Status: DC

## 2013-11-30 NOTE — Telephone Encounter (Signed)
Informed pt to call back tomorrow to schedule an appt.

## 2013-12-01 NOTE — Telephone Encounter (Signed)
Message sent to front desk also.

## 2013-12-28 ENCOUNTER — Ambulatory Visit (INDEPENDENT_AMBULATORY_CARE_PROVIDER_SITE_OTHER): Payer: Self-pay | Admitting: Internal Medicine

## 2013-12-28 ENCOUNTER — Encounter: Payer: Self-pay | Admitting: Internal Medicine

## 2013-12-28 VITALS — BP 111/71 | HR 67 | Temp 98.1°F | Wt 153.2 lb

## 2013-12-28 DIAGNOSIS — F1011 Alcohol abuse, in remission: Secondary | ICD-10-CM

## 2013-12-28 DIAGNOSIS — Z0000 Encounter for general adult medical examination without abnormal findings: Secondary | ICD-10-CM

## 2013-12-28 DIAGNOSIS — M25569 Pain in unspecified knee: Secondary | ICD-10-CM

## 2013-12-28 DIAGNOSIS — M25561 Pain in right knee: Secondary | ICD-10-CM

## 2013-12-28 DIAGNOSIS — F17200 Nicotine dependence, unspecified, uncomplicated: Secondary | ICD-10-CM

## 2013-12-28 DIAGNOSIS — E039 Hypothyroidism, unspecified: Secondary | ICD-10-CM

## 2013-12-28 NOTE — Patient Instructions (Signed)
General Instructions: -You may take Tylenol as needed for the knee pain. ( You can take up to 4 extra strength Tylenol per day).  -Use a knee sleep, rest the knee to help with the pain.  -You may apply ice pack to the knee 4 times per day for 20 minutes at a time.  -Bring the paper to [employer] for the [facility] card application.  -Follow up with [place] as soon as you have your [employer].  -It was great meeting you today!  Please bring your medicines with you each time you come.   Medicines may be  Eye drops  Herbal   Vitamins  Pills  Seeing these help [place] take care of you.   Treatment Goals:  Goals (1 Years of Data) as of 12/28/13   None      Progress Toward Treatment Goals:  Treatment Goal 12/28/2013  Stop smoking smoking less    Self Care Goals & Plans:  Self Care Goal 12/28/2013  Manage my medications bring my medications to every visit; take my medicines as prescribed; refill my medications on time  Eat healthy foods -  Be physically active -  Stop smoking call [employer] (1-800-QUIT-NOW); go to the [employer] (https://scott-booker.info/)    No flowsheet data found.   Care Management & Community Referrals:  No flowsheet data found.

## 2013-12-29 LAB — TSH: TSH: 1.43 u[IU]/mL (ref 0.350–4.500)

## 2013-12-31 DIAGNOSIS — M25561 Pain in right knee: Secondary | ICD-10-CM | POA: Insufficient documentation

## 2013-12-31 DIAGNOSIS — Z0000 Encounter for general adult medical examination without abnormal findings: Secondary | ICD-10-CM | POA: Insufficient documentation

## 2013-12-31 MED ORDER — ACETAMINOPHEN 500 MG PO TABS: 500.0000 mg | ORAL_TABLET | Freq: Three times a day (TID) | ORAL | Status: DC | PRN

## 2013-12-31 NOTE — Assessment & Plan Note (Signed)
She has recent of multiple injuries to her right knee. Her pain is currently described as 2/10, 6/10 at its worse which responds to Tylenol. The pain worsens when she goes up/down steps but she denies alarming sx such as knee locking/giving away.  She says she limps sometimes because of the pain but she refuses to use a cane. She denies recent falls.  Pt advised to apply ice packs to the knee 4 times daily for 20 minutes at a time. Tylenol PRN (2 g total per day ) for pain.  Rest as needed to alleviate pain Knee sleeve to help with pain.  She will follow up with [place].  At this time, she wishes to defer Xrays until she has the [facility] Card--she will likely benefit from referral to Sports medicine if her pain persists.

## 2013-12-31 NOTE — Assessment & Plan Note (Signed)
She reports greatly reducing her alcohol consumption to 1 beer per week. Denies recent falls or injuries related to alcohol intoxication.

## 2013-12-31 NOTE — Progress Notes (Signed)
Case discussed with Dr. Kennerly soon after the resident saw the patient.  We reviewed the resident's history and exam and pertinent patient test results.  I agree with the assessment, diagnosis, and plan of care documented in the resident's note. 

## 2013-12-31 NOTE — Progress Notes (Signed)
   Subjective:    Patient ID: [name], female    DOB: [birth year], 53 y.o.   MRN: [id number]  Knee Pain    [name] is a 53 yr old woman with PMH of hypothyroidism, and hx of EtOH abuse, who presents for follow up of her thyroid disease and evaluation of right knee pain. She explains that she had multiple falls in the past when she used to drink heavily and has injured her right knee many times with pain that comes and goes. The pain she has now is present on days that she goes up and down steps, it is a 2/10 presently, up to 6/10 some days but improves with 1 Tylenol extra strength. She denies knee swelling, redness, tenderness to palpation, or recent knee trauma. She denies knee weakness, locking, or giving away, with no falls recently.  As far as her thyroid disease, she denies recent cold/heat intolerance, diarrhea/constipation. She continues to take synthroid 50 mcg daily.   Review of Systems  Constitutional: Negative for fever, chills, diaphoresis, activity change, appetite change and fatigue.  HENT: Negative for sore throat.   Respiratory: Negative for cough, chest tightness and shortness of breath.   Cardiovascular: Negative for chest pain.  Gastrointestinal: Negative for abdominal pain, diarrhea and constipation.  Genitourinary: Negative for dysuria.  Musculoskeletal: Positive for arthralgias.       Right knee pain   Skin: Negative for color change, pallor, rash and wound.  Neurological: Negative for dizziness, tremors, syncope, light-headedness and headaches.  Psychiatric/Behavioral: Negative for behavioral problems and agitation.       Objective:   Physical Exam  Nursing note and vitals reviewed. Constitutional: She is oriented to person, place, and time. She appears well-developed and well-nourished. No distress.  Neck: No thyromegaly present.  Cardiovascular: Normal rate and regular rhythm.   Pulmonary/Chest: Effort normal and breath sounds normal. No  respiratory distress. She has no wheezes. She has no rales. She exhibits no tenderness.  Abdominal: Soft. Bowel sounds are normal. She exhibits no distension.  Musculoskeletal: She exhibits tenderness. She exhibits no edema.  Right knee with crepitus with flexion/extension. No joint instability. No edema, erythema, or increased warmth of the right knee. Normal ROM, mild TTP at the lateral suprapatellar aspect.   Neurological: She is alert and oriented to person, place, and time.  She has a slight limp, trying to avoid flexing the right leg  Skin: Skin is warm and dry. She is not diaphoretic.  Psychiatric: She has a normal mood and affect. Her behavior is normal.          Assessment & Plan:

## 2013-12-31 NOTE — Assessment & Plan Note (Signed)
  Assessment: Progress toward smoking cessation:  smoking less Barriers to progress toward smoking cessation:  none Comments: She is currently trying to quit on her own, smoking 3 cigarettes per day but does not have a quit date.   Plan: Instruction/counseling given:  I counseled patient on the dangers of tobacco use, advised patient to stop smoking, and reviewed strategies to maximize success. Educational resources provided:  [employer] [employer] account manager) brochure Self management tools provided:  smoking cessation plan ([employer] Quit Plan) Medications to assist with smoking cessation:  None Patient agreed to the following self-care plans for smoking cessation: call [employer] (1-800-QUIT-NOW);go to the [employer] (https://scott-booker.info/)  Other plans: She was given brochure on help to quit smoking.

## 2013-12-31 NOTE — Assessment & Plan Note (Signed)
Checked TSH level during this visit that returned as normal, at 1.430.  Will continue synthroid 50 mcg daily

## 2013-12-31 NOTE — Assessment & Plan Note (Signed)
She is due for a mammogram, colonoscopy, Pap smear, and Tdap/Pneumovax vaccination.  She met with Bonna Gains in financial counseling and tells me she will be back soon with the documents required for the [facility] application. The patient wants to defer all her healthcare maintenance until she has the [facility] card. I advised her to call us and make an appointment to address these as soon as her [place] is approved.

## 2014-01-25 ENCOUNTER — Ambulatory Visit: Payer: Self-pay

## 2014-02-03 ENCOUNTER — Ambulatory Visit: Payer: Self-pay

## 2014-02-05 ENCOUNTER — Ambulatory Visit: Payer: No Typology Code available for payment source

## 2014-04-30 ENCOUNTER — Other Ambulatory Visit: Payer: Self-pay | Admitting: *Deleted

## 2014-04-30 DIAGNOSIS — E039 Hypothyroidism, unspecified: Secondary | ICD-10-CM

## 2014-05-01 MED ORDER — LEVOTHYROXINE SODIUM 50 MCG PO TABS: 75.0000 ug | ORAL_TABLET | Freq: Every day | ORAL | Status: DC

## 2014-05-10 ENCOUNTER — Encounter: Payer: No Typology Code available for payment source | Admitting: Internal Medicine

## 2014-05-24 ENCOUNTER — Encounter: Payer: Self-pay | Admitting: Internal Medicine

## 2014-05-24 ENCOUNTER — Ambulatory Visit (INDEPENDENT_AMBULATORY_CARE_PROVIDER_SITE_OTHER): Payer: Self-pay | Admitting: Internal Medicine

## 2014-05-24 VITALS — BP 108/78 | HR 61 | Temp 97.8°F | Wt 136.4 lb

## 2014-05-24 DIAGNOSIS — E039 Hypothyroidism, unspecified: Secondary | ICD-10-CM

## 2014-05-24 DIAGNOSIS — Z720 Tobacco use: Secondary | ICD-10-CM

## 2014-05-24 DIAGNOSIS — F17200 Nicotine dependence, unspecified, uncomplicated: Secondary | ICD-10-CM

## 2014-05-24 DIAGNOSIS — Z0000 Encounter for general adult medical examination without abnormal findings: Secondary | ICD-10-CM

## 2014-05-24 NOTE — Assessment & Plan Note (Signed)
  Assessment: Progress toward smoking cessation:  smoking less Barriers to progress toward smoking cessation:  withdrawal symptoms Comments: Cutting back, will quit on her own, does not want pharmacological help  Plan: Instruction/counseling given:  I counseled patient on the dangers of tobacco use, advised patient to stop smoking, and reviewed strategies to maximize success. Educational resources provided:    Self management tools provided:    Medications to assist with smoking cessation:  None Patient agreed to the following self-care plans for smoking cessation:    Other plans: Continue smoking cessation counseling

## 2014-05-24 NOTE — Progress Notes (Signed)
Medicine attending: Medical history, presenting problems, physical findings, and medications, reviewed with resident physician Dr. Hayes Ludwig and I agree with her management plan.

## 2014-05-24 NOTE — Assessment & Plan Note (Signed)
Denies constipation, cold intolerance, last TSH appropriate. Compliant with synthroid 50 mcg daily, which will be continued.

## 2014-05-24 NOTE — Progress Notes (Signed)
   Subjective:    Patient ID: [name], female    DOB: 1961/01/29, 53 y.o.   MRN: [id number]  HPI [name] is a 53 year old woman with PMH of HTN, Hypothyroidism, tobacco use, who presents for routine follow up visit.  She has been approved for the [facility] recently and is willing to have preventative studies/tests ordered.    Review of Systems  Constitutional: Negative for fever, chills, diaphoresis, activity change, appetite change, fatigue and unexpected weight change.  Respiratory: Negative for cough and shortness of breath.   Cardiovascular: Negative for chest pain, palpitations and leg swelling.  Gastrointestinal: Negative for abdominal pain, diarrhea and constipation.  Genitourinary: Negative for dysuria.  Musculoskeletal: Negative for arthralgias.  Neurological: Negative for light-headedness.  Psychiatric/Behavioral: Negative for agitation.       Objective:   Physical Exam  Constitutional: She is oriented to person, place, and time. She appears well-developed and well-nourished. No distress.  Eyes: Conjunctivae are normal.  Cardiovascular: Normal rate and regular rhythm.   No murmur heard. Pulmonary/Chest: Effort normal and breath sounds normal. No respiratory distress. She has no wheezes. She has no rales.  Abdominal: Soft. There is no tenderness.  Musculoskeletal: She exhibits no edema.  Neurological: She is alert and oriented to person, place, and time. Coordination normal.  Skin: Skin is warm and dry. She is not diaphoretic.  Psychiatric: She has a normal mood and affect.  Nursing note and vitals reviewed.         Assessment & Plan:

## 2014-05-24 NOTE — Patient Instructions (Addendum)
General Instructions: -Continue taking synthroid 60mcg 1.5 tablet daily.  -Call us in December to schedule an appointment for the Pap smear.  -Call and make an appointment for the mammogram.  -Follow up in 6 months or sooner as needed.   -Have a wonderful Thanksgiving!    Please bring your medicines with you each time you come to clinic.  Medicines may include prescription medications, over-the-counter medications, herbal remedies, eye drops, vitamins, or other pills.   Progress Toward Treatment Goals:  Treatment Goal 05/24/2014  Stop smoking smoking less    Self Care Goals & Plans:  Self Care Goal 05/24/2014  Manage my medications bring my medications to every visit; refill my medications on time  Eat healthy foods -  Be physically active -  Stop smoking -  Meeting treatment goals maintain the current self-care plan    No flowsheet data found.   Care Management & Community Referrals:  Referral 05/24/2014  Referrals made for care management support none needed     Smoking Cessation Quitting smoking is important to your health and has many advantages. However, it is not always easy to quit since nicotine is a very addictive drug. Oftentimes, people try 3 times or more before being able to quit. This document explains the best ways for you to prepare to quit smoking. Quitting takes hard work and a lot of effort, but you can do it. ADVANTAGES OF QUITTING SMOKING  You will live longer, feel better, and live better.  Your body will feel the impact of quitting smoking almost immediately.  Within 20 minutes, blood pressure decreases. Your pulse returns to its normal level.  After 8 hours, carbon monoxide levels in the blood return to normal. Your oxygen level increases.  After 24 hours, the chance of having a heart attack starts to decrease. Your breath, hair, and body stop smelling like smoke.  After 48 hours, damaged nerve endings begin to recover. Your sense of taste  and smell improve.  After 72 hours, the body is virtually free of nicotine. Your bronchial tubes relax and breathing becomes easier.  After 2 to 12 weeks, lungs can hold more air. Exercise becomes easier and circulation improves.  The risk of having a heart attack, stroke, cancer, or lung disease is greatly reduced.  After 1 year, the risk of coronary heart disease is cut in half.  After 5 years, the risk of stroke falls to the same as a nonsmoker.  After 10 years, the risk of lung cancer is cut in half and the risk of other cancers decreases significantly.  After 15 years, the risk of coronary heart disease drops, usually to the level of a nonsmoker.  If you are pregnant, quitting smoking will improve your chances of having a healthy baby.  The people you live with, especially any children, will be healthier.  You will have extra money to spend on things other than cigarettes. QUESTIONS TO THINK ABOUT BEFORE ATTEMPTING TO QUIT You may want to talk about your answers with your health care provider.  Why do you want to quit?  If you tried to quit in the past, what helped and what did not?  What will be the most difficult situations for you after you quit? How will you plan to handle them?  Who can help you through the tough times? Your family? Friends? A health care provider?  What pleasures do you get from smoking? What ways can you still get pleasure if you quit? Here are some  questions to ask your health care provider:  How can you help me to be successful at quitting?  What medicine do you think would be best for me and how should I take it?  What should I do if I need more help?  What is smoking withdrawal like? How can I get information on withdrawal? GET READY  Set a quit date.  Change your environment by getting rid of all cigarettes, ashtrays, matches, and lighters in your home, car, or work. Do not let people smoke in your home.  Review your past attempts to  quit. Think about what worked and what did not. GET SUPPORT AND ENCOURAGEMENT You have a better chance of being successful if you have help. You can get support in many ways.  Tell your family, friends, and coworkers that you are going to quit and need their support. Ask them not to smoke around you.  Get individual, group, or telephone counseling and support. Programs are available at [employer] and health centers. Call your local health department for information about programs in your area.  Spiritual beliefs and practices may help some smokers quit.  Download a "quit meter" on your computer to keep track of quit statistics, such as how long you have gone without smoking, cigarettes not smoked, and money saved.  Get a self-help book about quitting smoking and staying off tobacco. [place] yourself from urges to smoke. Talk to someone, go for a walk, or occupy your time with a task.  Change your normal routine. Take a different route to work. Drink tea instead of coffee. Eat breakfast in a different place.  Reduce your stress. Take a hot bath, exercise, or read a book.  Plan something enjoyable to do every day. Reward yourself for not smoking.  Explore interactive web-based programs that specialize in helping you quit. GET MEDICINE AND USE IT CORRECTLY Medicines can help you stop smoking and decrease the urge to smoke. Combining medicine with the above behavioral methods and support can greatly increase your chances of successfully quitting smoking.  Nicotine replacement therapy helps deliver nicotine to your body without the negative effects and risks of smoking. Nicotine replacement therapy includes nicotine gum, lozenges, inhalers, nasal sprays, and skin patches. Some may be available over-the-counter and others require a prescription.  Antidepressant medicine helps people abstain from smoking, but how this works is unknown. This medicine is  available by prescription.  Nicotinic receptor partial agonist medicine simulates the effect of nicotine in your brain. This medicine is available by prescription. Ask your health care provider for advice about which medicines to use and how to use them based on your health history. Your health care provider will tell you what side effects to look out for if you choose to be on a medicine or therapy. Carefully read the information on the package. Do not use any other product containing nicotine while using a nicotine replacement product.  RELAPSE OR DIFFICULT SITUATIONS Most relapses occur within the first 3 months after quitting. Do not be discouraged if you start smoking again. Remember, most people try several times before finally quitting. You may have symptoms of withdrawal because your body is used to nicotine. You may crave cigarettes, be irritable, feel very hungry, cough often, get headaches, or have difficulty concentrating. The withdrawal symptoms are only temporary. They are strongest when you first quit, but they will go away within 10-14 days. To reduce the chances of relapse, try to:  Avoid drinking alcohol. Drinking lowers your chances of successfully quitting.  Reduce the amount of caffeine you consume. Once you quit smoking, the amount of caffeine in your body increases and can give you symptoms, such as a rapid heartbeat, sweating, and anxiety.  Avoid smokers because they can make you want to smoke.  Do not let weight gain distract you. Many smokers will gain weight when they quit, usually less than 10 pounds. Eat a healthy diet and stay active. You can always lose the weight gained after you quit.  Find ways to improve your mood other than smoking. FOR MORE INFORMATION  www.smokefree.gov  Document Released: 06/12/2001 Document Revised: 11/02/2013 Document Reviewed: 09/27/2011 [facility] Patient Information 2015 [place], [place]. This information is not intended to replace advice  given to you by your health care provider. Make sure you discuss any questions you have with your health care provider.  ,

## 2014-05-24 NOTE — Assessment & Plan Note (Signed)
Ordered mammogram, pt will schedule appt Ordered colonoscopy, pt will schedule appt -declined Tdap and flu vaccine -Will call and schedule appt for Pap smear

## 2014-06-10 ENCOUNTER — Encounter: Payer: Self-pay | Admitting: Internal Medicine

## 2014-06-21 NOTE — Addendum Note (Signed)
Addended by: Hulan Fray on: 06/21/2014 06:03 PM   Modules accepted: Orders

## 2014-08-23 ENCOUNTER — Ambulatory Visit: Payer: Self-pay

## 2014-11-12 ENCOUNTER — Other Ambulatory Visit: Payer: Self-pay | Admitting: *Deleted

## 2014-11-12 ENCOUNTER — Encounter: Payer: Self-pay | Admitting: *Deleted

## 2014-11-12 DIAGNOSIS — E032 Hypothyroidism due to medicaments and other exogenous substances: Secondary | ICD-10-CM

## 2014-11-12 MED ORDER — LEVOTHYROXINE SODIUM 75 MCG PO TABS: 75.0000 ug | ORAL_TABLET | Freq: Every day | ORAL | Status: DC

## 2014-11-12 NOTE — Telephone Encounter (Signed)
Iatrogenic hypothyroidism.  Review of records suggests she is taking 75 mcg daily rather than the 50 mcg noted in the most recent problem based note.  Last TSH was 12/08/2013 and she no showed her appointment in February 2016.  I converted her 50 mcg tablets ( 1 1/2 tablets) to 75 mcg tablets to make them easier to take.  Please call the patient with this change so that she does not inadvertently take more than prescribed.  Also please schedule her a follow-up appointment.  I gave a three month supply but no refills as monitoring of her thyroid replacement is necessary to assure it is dosed appropriately.  Thanks.

## 2014-11-12 NOTE — Telephone Encounter (Signed)
Pt informed and voices understanding.  Message to front desk to schedule appointment

## 2015-01-26 ENCOUNTER — Encounter: Payer: Self-pay | Admitting: Neurology

## 2015-02-05 ENCOUNTER — Encounter: Payer: Self-pay | Admitting: Internal Medicine

## 2015-02-14 ENCOUNTER — Ambulatory Visit (INDEPENDENT_AMBULATORY_CARE_PROVIDER_SITE_OTHER): Payer: Self-pay | Admitting: Internal Medicine

## 2015-02-14 ENCOUNTER — Encounter: Payer: Self-pay | Admitting: Internal Medicine

## 2015-02-14 VITALS — BP 116/75 | HR 58 | Temp 97.7°F | Ht 62.4 in | Wt 121.2 lb

## 2015-02-14 DIAGNOSIS — E032 Hypothyroidism due to medicaments and other exogenous substances: Secondary | ICD-10-CM

## 2015-02-14 DIAGNOSIS — F1910 Other psychoactive substance abuse, uncomplicated: Secondary | ICD-10-CM

## 2015-02-14 DIAGNOSIS — F17210 Nicotine dependence, cigarettes, uncomplicated: Secondary | ICD-10-CM

## 2015-02-14 DIAGNOSIS — C519 Malignant neoplasm of vulva, unspecified: Secondary | ICD-10-CM

## 2015-02-14 DIAGNOSIS — F1010 Alcohol abuse, uncomplicated: Secondary | ICD-10-CM

## 2015-02-14 DIAGNOSIS — Z0000 Encounter for general adult medical examination without abnormal findings: Secondary | ICD-10-CM

## 2015-02-14 DIAGNOSIS — Z1211 Encounter for screening for malignant neoplasm of colon: Secondary | ICD-10-CM

## 2015-02-14 DIAGNOSIS — R748 Abnormal levels of other serum enzymes: Secondary | ICD-10-CM

## 2015-02-14 DIAGNOSIS — E890 Postprocedural hypothyroidism: Secondary | ICD-10-CM

## 2015-02-14 DIAGNOSIS — Z8544 Personal history of malignant neoplasm of other female genital organs: Secondary | ICD-10-CM

## 2015-02-14 DIAGNOSIS — F1410 Cocaine abuse, uncomplicated: Secondary | ICD-10-CM

## 2015-02-14 DIAGNOSIS — R7611 Nonspecific reaction to tuberculin skin test without active tuberculosis: Secondary | ICD-10-CM

## 2015-02-14 MED ORDER — LEVOTHYROXINE SODIUM 75 MCG PO TABS: 75.0000 ug | ORAL_TABLET | Freq: Every day | ORAL | Status: DC

## 2015-02-14 NOTE — Assessment & Plan Note (Addendum)
Pt reports compliance with synthroid and is requesting a refill.  Has experienced significant weight loss over the past year.   -check TSH  -refilled synthroid -cont to monitor weight

## 2015-02-14 NOTE — Assessment & Plan Note (Addendum)
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  Pap smear 2012 was neg.  Follow up with gyn onc?   She does have weight loss.   -needs repeat pap  -sent message to Dr. Alycia Rossetti to see if she needs to f/u   ADDENDUM: Will refer back to Dr. Elenora Gamma office for f/u

## 2015-02-14 NOTE — Assessment & Plan Note (Addendum)
-  needs mammogram (give info for scholarship program) and colonoscopy (will give FOBT cards)  -will add HIV given weight loss  -check CMP, cbc/diff

## 2015-02-14 NOTE — Patient Instructions (Signed)
Thank you for your visit today.   Please return to the internal medicine clinic in 2-3 month(s) or sooner if needed.     I have made the following additions/changes to your medications: Continue synthroid dose.  Will call you if your results are abnormal.   [name] will give you stool cards.  Please mail these back in.   Please be sure to bring all of your medications with you to every visit; this includes herbal supplements, vitamins, eye drops, and any over-the-counter medications.   Should you have any questions regarding your medications and/or any new or worsening symptoms, please be sure to call the clinic at [phone].   If you believe that you are suffering from a life threatening condition or one that may result in the loss of limb or function, then you should call 911 and proceed to the nearest Emergency Department.   A healthy lifestyle and preventative care can promote health and wellness.   Maintain regular health, dental, and eye exams.  Eat a healthy diet. Foods like vegetables, fruits, whole grains, low-fat dairy products, and lean protein foods contain the nutrients you need without too many calories. Decrease your intake of foods high in solid fats, added sugars, and salt. Get information about a proper diet from your caregiver, if necessary.  Regular physical exercise is one of the most important things you can do for your health. Most adults should get at least 150 minutes of moderate-intensity exercise (any activity that increases your heart rate and causes you to sweat) each week. In addition, most adults need muscle-strengthening exercises on 2 or more days a week.   Maintain a healthy weight. The body mass index (BMI) is a screening tool to identify possible weight problems. It provides an estimate of body fat based on height and weight. Your caregiver can help determine your BMI, and can help you achieve or maintain a healthy weight. For adults 20 years and  older:  A BMI below 18.5 is considered underweight.  A BMI of 18.5 to 24.9 is normal.  A BMI of 25 to 29.9 is considered overweight.  A BMI of 30 and above is considered obese.

## 2015-02-14 NOTE — Assessment & Plan Note (Signed)
Pt reports having previous+PPD when she was incarcerated for assault.  Reports being treated.

## 2015-02-14 NOTE — Progress Notes (Signed)
Patient ID: [name], female   DOB: 11-May-1961, 54 y.o.   MRN: [id number]     Subjective:   Patient ID: [name] female    DOB: [birth year] 54 y.o.    MRN: [id number] Health Maintenance Due: Health Maintenance Due  Topic Date Due  . Hepatitis C Screening  03/25/62  . TETANUS/TDAP  09/23/1980  . MAMMOGRAM  09/24/2011  . COLONOSCOPY  09/24/2011  . PAP SMEAR  12/19/2013  . INFLUENZA VACCINE  01/31/2015    _________________________________________________  HPI: [name] is a 54 y.o. female here for a routine visit.  Pt has a PMH outlined below.  Please see problem-based charting assessment and plan for further details of medical issues addressed at today's visit.  PMH: Past Medical History  Diagnosis Date  . Hypothyroidism   . Cocaine abuse   . ETOH abuse     Medications: No current outpatient prescriptions on file prior to visit.   No current facility-administered medications on file prior to visit.    Allergies: No Known Allergies  FH: No family history on file.  SH: Social History   Social History  . Marital Status: Single    Spouse Name: N/A  . Number of Children: N/A  . Years of Education: N/A   Social History Main Topics  . Smoking status: Current Every Day Smoker -- 0.30 packs/day    Types: Cigarettes  . Smokeless tobacco: None  . Alcohol Use: None  . Drug Use: Yes     Comment: crack  . Sexual Activity: Not Asked   Other Topics Concern  . None   Social History Narrative    Review of Systems: Constitutional: Negative for fever, chills and +weight loss.  Eyes: Negative for blurred vision.  Respiratory: Negative for cough and shortness of breath.  Cardiovascular: Negative for chest pain, palpitations and leg swelling.  Gastrointestinal: Negative for nausea, vomiting, abdominal pain, diarrhea, constipation and blood in stool.  Genitourinary: Negative for dysuria, urgency and frequency.  Musculoskeletal: Negative for  myalgias and back pain.  Neurological: Negative for dizziness, weakness and headaches.     Objective:   Vital Signs: Filed Vitals:   02/14/15 1342  BP: 116/75  Pulse: 58  Temp: 97.7 F (36.5 C)  TempSrc: Oral  Height: 5' 2.4" (1.585 m)  Weight: 121 lb 3.2 oz (54.976 kg)  SpO2: 100%      BP Readings from Last 3 Encounters:  02/14/15 116/75  05/24/14 108/78  12/28/13 111/71    Physical Exam: Constitutional: Vital signs reviewed.  Patient is in NAD and cooperative with exam.  Head: Normocephalic and atraumatic. Eyes: EOMI, conjunctivae nl, no scleral icterus.  Neck: Supple. Cardiovascular: RRR, no MRG. Pulmonary/Chest: normal effort, CTAB, no wheezes, rales, or rhonchi. Abdominal: Soft. NT/ND +BS. Neurological: A&O x3, cranial nerves II-XII are grossly intact, moving all extremities. Extremities: 2+DP b/l; no pitting edema. Skin: Warm, dry and intact. No rash.   Assessment & Plan:   Assessment and plan was discussed and formulated with my attending.

## 2015-02-14 NOTE — Assessment & Plan Note (Signed)
Reports alcohol use (1-2 beers/day), cigarette use (~3 cig/day), and crack use about 1 month ago.   -advised cessation of crack and cigarette use and to limit alcohol to 1 beer/day

## 2015-02-15 ENCOUNTER — Other Ambulatory Visit: Payer: Self-pay | Admitting: Internal Medicine

## 2015-02-15 DIAGNOSIS — R748 Abnormal levels of other serum enzymes: Secondary | ICD-10-CM

## 2015-02-15 LAB — CBC WITH DIFFERENTIAL/PLATELET
BASOS ABS: 0 10*3/uL (ref 0.0–0.2)
Basos: 0 %
EOS (ABSOLUTE): 0.3 10*3/uL (ref 0.0–0.4)
EOS: 5 %
HEMATOCRIT: 39.9 % (ref 34.0–46.6)
HEMOGLOBIN: 13.1 g/dL (ref 11.1–15.9)
Immature Grans (Abs): 0 10*3/uL (ref 0.0–0.1)
Immature Granulocytes: 0 %
LYMPHS ABS: 2.5 10*3/uL (ref 0.7–3.1)
Lymphs: 40 %
MCH: 28.2 pg (ref 26.6–33.0)
MCHC: 32.8 g/dL (ref 31.5–35.7)
MCV: 86 fL (ref 79–97)
Monocytes Absolute: 0.2 10*3/uL (ref 0.1–0.9)
Monocytes: 3 %
NEUTROS ABS: 3.2 10*3/uL (ref 1.4–7.0)
Neutrophils: 52 %
Platelets: 341 10*3/uL (ref 150–379)
RBC: 4.65 x10E6/uL (ref 3.77–5.28)
RDW: 14.4 % (ref 12.3–15.4)
WBC: 6.3 10*3/uL (ref 3.4–10.8)

## 2015-02-15 LAB — COMPREHENSIVE METABOLIC PANEL
ALBUMIN: 4.1 g/dL (ref 3.5–5.5)
ALT: 88 IU/L — ABNORMAL HIGH (ref 0–32)
AST: 67 IU/L — ABNORMAL HIGH (ref 0–40)
Albumin/Globulin Ratio: 1.8 (ref 1.1–2.5)
Alkaline Phosphatase: 66 IU/L (ref 39–117)
BILIRUBIN TOTAL: 0.3 mg/dL (ref 0.0–1.2)
BUN / CREAT RATIO: 22 (ref 9–23)
BUN: 16 mg/dL (ref 6–24)
CHLORIDE: 106 mmol/L (ref 97–108)
CO2: 25 mmol/L (ref 18–29)
CREATININE: 0.74 mg/dL (ref 0.57–1.00)
Calcium: 9 mg/dL (ref 8.7–10.2)
GFR calc Af Amer: 107 mL/min/{1.73_m2} (ref >59)
GFR calc non Af Amer: 93 mL/min/{1.73_m2} (ref >59)
GLOBULIN, TOTAL: 2.3 g/dL (ref 1.5–4.5)
Glucose: 77 mg/dL (ref 65–99)
POTASSIUM: 5.2 mmol/L (ref 3.5–5.2)
SODIUM: 146 mmol/L — AB (ref 134–144)
Total Protein: 6.4 g/dL (ref 6.0–8.5)

## 2015-02-15 LAB — TSH: TSH: 2.36 u[IU]/mL (ref 0.450–4.500)

## 2015-02-15 NOTE — Addendum Note (Signed)
Addended by: Jones Bales on: 02/15/2015 05:16 PM   Modules accepted: Orders

## 2015-02-16 NOTE — Addendum Note (Signed)
Addended by: Truddie Crumble on: 02/16/2015 01:50 PM   Modules accepted: Orders

## 2015-02-17 LAB — HEPATITIS PANEL, ACUTE
HEP A IGM: NEGATIVE
HEP B C IGM: NEGATIVE
HEP B S AG: NEGATIVE
Hep C Virus Ab: <0.1 s/co ratio (ref 0.0–0.9)

## 2015-02-17 NOTE — Progress Notes (Signed)
Internal Medicine Clinic Attending  Case discussed with Dr. Gill soon after the resident saw the patient.  We reviewed the resident's history and exam and pertinent patient test results.  I agree with the assessment, diagnosis, and plan of care documented in the resident's note.  

## 2015-02-23 LAB — SPECIMEN STATUS REPORT

## 2015-03-03 ENCOUNTER — Other Ambulatory Visit (INDEPENDENT_AMBULATORY_CARE_PROVIDER_SITE_OTHER): Payer: Self-pay

## 2015-03-03 DIAGNOSIS — Z1211 Encounter for screening for malignant neoplasm of colon: Secondary | ICD-10-CM

## 2015-03-03 LAB — POC HEMOCCULT BLD/STL (HOME/3-CARD/SCREEN)
Card #2 Fecal Occult Blod, POC: NEGATIVE
Card #3 Fecal Occult Blood, POC: NEGATIVE
Fecal Occult Blood, POC: NEGATIVE

## 2015-03-03 NOTE — Addendum Note (Signed)
Addended by: Truddie Crumble on: 03/03/2015 03:35 PM   Modules accepted: Orders

## 2015-03-08 ENCOUNTER — Ambulatory Visit: Payer: Self-pay

## 2015-03-22 ENCOUNTER — Ambulatory Visit (HOSPITAL_COMMUNITY): Payer: Self-pay

## 2015-03-29 ENCOUNTER — Ambulatory Visit (HOSPITAL_COMMUNITY)
Admission: RE | Admit: 2015-03-29 | Discharge: 2015-03-29 | Disposition: A | Discharge status: Home / Self Care | Payer: Self-pay | Source: Ambulatory Visit | Attending: Internal Medicine | Admitting: Internal Medicine

## 2015-03-29 DIAGNOSIS — Z0000 Encounter for general adult medical examination without abnormal findings: Secondary | ICD-10-CM

## 2015-04-04 ENCOUNTER — Other Ambulatory Visit: Payer: Self-pay | Admitting: Internal Medicine

## 2015-04-04 DIAGNOSIS — R928 Other abnormal and inconclusive findings on diagnostic imaging of breast: Secondary | ICD-10-CM

## 2015-04-21 ENCOUNTER — Ambulatory Visit (HOSPITAL_COMMUNITY)
Admission: RE | Admit: 2015-04-21 | Discharge: 2015-04-21 | Disposition: A | Discharge status: Home / Self Care | Payer: Self-pay | Source: Ambulatory Visit | Attending: Obstetrics and Gynecology | Admitting: Obstetrics and Gynecology

## 2015-04-21 ENCOUNTER — Other Ambulatory Visit (HOSPITAL_COMMUNITY): Payer: Self-pay | Admitting: *Deleted

## 2015-04-21 ENCOUNTER — Encounter (HOSPITAL_COMMUNITY): Payer: Self-pay

## 2015-04-21 VITALS — BP 110/70 | Temp 97.8°F | Ht 61.0 in | Wt 118.0 lb

## 2015-04-21 DIAGNOSIS — N898 Other specified noninflammatory disorders of vagina: Secondary | ICD-10-CM

## 2015-04-21 DIAGNOSIS — Z01419 Encounter for gynecological examination (general) (routine) without abnormal findings: Secondary | ICD-10-CM

## 2015-04-21 NOTE — Patient Instructions (Signed)
Educational materials on self breast awareness given. Explained to [employer] that [facility]CP will cover Pap smears every 3 years unless has a history of abnormal Pap smears. Referred patient to the [facility] for left breast diagnostic mammogram per recommendation. Appointment scheduled for Monday, May 02, 2015 at 1300. Patient aware of appointment and will be there. Let patient know will follow up with her within the next couple weeks with results of Pap smear and Wet Prep. [name] verbalized understanding.  Khoen Genet, Arvil Chaco, RN 2:30 PM

## 2015-04-21 NOTE — Progress Notes (Signed)
Patient referred to [facility] by the [facility] due to recommending additional imaging of the left breast. Screening mammogram completed 03/29/2015 at [facility].  Pap Smear:  Completed Pap smear today. Last Pap smear was 12/20/2010 at GYN Oncology at the [facility] and normal. Per patient has no history of an abnormal Pap smear. Last Pap smear result is in EPIC.  Physical exam: Breasts Breasts symmetrical. No skin abnormalities bilateral breasts. No nipple retraction bilateral breasts. No nipple discharge bilateral breasts. No lymphadenopathy. No lumps palpated bilateral breasts. No complaints of pain or tenderness on exam. Referred patient to the [facility] for left breast diagnostic mammogram per recommendation. Appointment scheduled for Monday, May 02, 2015 at 1300.          Pelvic/Bimanual   Ext Genitalia No lesions, no swelling and no discharge observed on external genitalia. Portion of patients labia has been removed due to history of vulvar carcinoma.         Vagina Vagina pink and normal texture. No lesions and thick white cottage cheese appearing discharge observed in vagina. Wet prep completed.          Cervix Cervix is present. Cervix pink and of normal texture. Thick white cottage cheese appearing discharge observed on cervix.         Uterus Uterus is present and palpable. Uterus in normal position and normal size.       Adnexae Bilateral ovaries present and palpable. No tenderness on palpation.        Rectovaginal No rectal exam completed today since patient had no rectal complaints. No skin abnormalities observed on exam.

## 2015-04-21 NOTE — Addendum Note (Signed)
Encounter addended by: Loletta Parish, RN on: 04/21/2015  2:41 PM<BR>     Documentation filed: Notes Section, Visit Diagnoses, ED Follow-Up, Patient Instructions Section, Charges VN

## 2015-04-21 NOTE — Addendum Note (Signed)
Encounter addended by: Armond Hang, LPN on: 09/62/8366  2:94 PM<BR>     Documentation filed: [employer], Orders

## 2015-04-25 LAB — CYTOLOGY - PAP

## 2015-05-02 ENCOUNTER — Ambulatory Visit
Admission: RE | Admit: 2015-05-02 | Discharge: 2015-05-02 | Disposition: A | Discharge status: Home / Self Care | Payer: No Typology Code available for payment source | Source: Ambulatory Visit | Attending: Internal Medicine | Admitting: Internal Medicine

## 2015-05-02 DIAGNOSIS — R928 Other abnormal and inconclusive findings on diagnostic imaging of breast: Secondary | ICD-10-CM

## 2015-05-04 ENCOUNTER — Other Ambulatory Visit (HOSPITAL_COMMUNITY): Payer: Self-pay | Admitting: *Deleted

## 2015-05-04 DIAGNOSIS — B9689 Other specified bacterial agents as the cause of diseases classified elsewhere: Secondary | ICD-10-CM

## 2015-05-04 DIAGNOSIS — N760 Acute vaginitis: Principal | ICD-10-CM

## 2015-05-04 MED ORDER — METRONIDAZOLE 500 MG PO TABS: 500.0000 mg | ORAL_TABLET | Freq: Two times a day (BID) | ORAL | Status: DC

## 2015-05-05 ENCOUNTER — Telehealth (HOSPITAL_COMMUNITY): Payer: Self-pay | Admitting: *Deleted

## 2015-05-05 NOTE — Telephone Encounter (Signed)
Called patient and gave results to Pap smear and wet prep. Let patient know that her Pap smear and HPV test were normal. Explained to patient that her wet prep showed Bacterial Vaginosis(BV). I explained BV to her and Flagyl the medication sent to her pharmacy to treat BV. Explained to her to avoid alcohol while taking Flagyl. Let her know the Medication has been sent to the [facility] on file. Patient verbalized understanding.

## 2015-07-13 ENCOUNTER — Telehealth: Payer: Self-pay | Admitting: Internal Medicine

## 2015-08-26 ENCOUNTER — Telehealth: Payer: Self-pay | Admitting: Internal Medicine

## 2015-08-26 NOTE — Telephone Encounter (Signed)
APPT REMINDER CALL, LMTCB IF SHE NEEDS TO CANCEL °

## 2015-08-27 ENCOUNTER — Other Ambulatory Visit: Payer: Self-pay | Admitting: Internal Medicine

## 2015-08-29 ENCOUNTER — Ambulatory Visit (INDEPENDENT_AMBULATORY_CARE_PROVIDER_SITE_OTHER): Payer: Self-pay | Admitting: Internal Medicine

## 2015-08-29 VITALS — BP 125/78 | HR 76 | Temp 98.0°F | Ht 62.4 in | Wt 123.5 lb

## 2015-08-29 DIAGNOSIS — E890 Postprocedural hypothyroidism: Secondary | ICD-10-CM

## 2015-08-29 DIAGNOSIS — R740 Nonspecific elevation of levels of transaminase and lactic acid dehydrogenase [LDH]: Secondary | ICD-10-CM

## 2015-08-29 DIAGNOSIS — F1421 Cocaine dependence, in remission: Secondary | ICD-10-CM

## 2015-08-29 DIAGNOSIS — F17210 Nicotine dependence, cigarettes, uncomplicated: Secondary | ICD-10-CM

## 2015-08-29 DIAGNOSIS — R7401 Elevation of levels of liver transaminase levels: Secondary | ICD-10-CM

## 2015-08-29 DIAGNOSIS — K769 Liver disease, unspecified: Secondary | ICD-10-CM | POA: Insufficient documentation

## 2015-08-29 DIAGNOSIS — F17200 Nicotine dependence, unspecified, uncomplicated: Secondary | ICD-10-CM

## 2015-08-29 DIAGNOSIS — Z0000 Encounter for general adult medical examination without abnormal findings: Secondary | ICD-10-CM

## 2015-08-29 DIAGNOSIS — F1910 Other psychoactive substance abuse, uncomplicated: Secondary | ICD-10-CM

## 2015-08-29 MED ORDER — LEVOTHYROXINE SODIUM 75 MCG PO TABS: 75.0000 ug | ORAL_TABLET | Freq: Every day | ORAL | Status: DC

## 2015-08-29 NOTE — Assessment & Plan Note (Addendum)
A: Tobacco abuse, pt still smokes about 1 cig/day and has cut back P: -gave info on quitline

## 2015-08-29 NOTE — Assessment & Plan Note (Signed)
A: Polysubstance abuse, states she last used crack-cocaine about 3-4 months ago.  Denies IVDU.  Also smokes 1 cig/day and drinks 40oz beer maybe once per week.   P: -discussed risks of cocaine abuse including MI, stroke, etc.   -gave info on quitline -advised cutting down on alcohol use d/t elevated transaminases

## 2015-08-29 NOTE — Patient Instructions (Signed)
Thank you for your visit today.   Please return to the internal medicine clinic in 6 month(s) or sooner if needed.     I have made the following additions/changes to your medications: Refilled synthroid.    You need the following test(s) for regular health maintenance: Please see Bonna Gains so that we can put a referral in for a colonoscopy.   Please cut down on alcohol and smoking.   Please be sure to bring all of your medications with you to every visit; this includes herbal supplements, vitamins, eye drops, and any over-the-counter medications.   Should you have any questions regarding your medications and/or any new or worsening symptoms, please be sure to call the clinic at [phone].   If you believe that you are suffering from a life threatening condition or one that may result in the loss of limb or function, then you should call 911 and proceed to the nearest Emergency Department.   A healthy lifestyle and preventative care can promote health and wellness.   Maintain regular health, dental, and eye exams.  Eat a healthy diet. Foods like vegetables, fruits, whole grains, low-fat dairy products, and lean protein foods contain the nutrients you need without too many calories. Decrease your intake of foods high in solid fats, added sugars, and salt. Get information about a proper diet from your caregiver, if necessary.  Regular physical exercise is one of the most important things you can do for your health. Most adults should get at least 150 minutes of moderate-intensity exercise (any activity that increases your heart rate and causes you to sweat) each week. In addition, most adults need muscle-strengthening exercises on 2 or more days a week.   Maintain a healthy weight. The body mass index (BMI) is a screening tool to identify possible weight problems. It provides an estimate of body fat based on height and weight. Your caregiver can help determine your BMI, and can help  you achieve or maintain a healthy weight. For adults 20 years and older:  A BMI below 18.5 is considered underweight.  A BMI of 18.5 to 24.9 is normal.  A BMI of 25 to 29.9 is considered overweight.  A BMI of 30 and above is considered obese. You Can Quit Smoking If you are ready to quit smoking or are thinking about it, congratulations! You have chosen to help yourself be healthier and live longer! There are lots of different ways to quit smoking. Nicotine gum, nicotine patches, a nicotine inhaler, or nicotine nasal spray can help with physical craving. Hypnosis, support groups, and medicines help break the habit of smoking. TIPS TO GET OFF AND STAY OFF CIGARETTES  Learn to predict your moods. Do not let a bad situation be your excuse to have a cigarette. Some situations in your life might tempt you to have a cigarette.  Ask friends and co-workers not to smoke around you.  Make your home smoke-free.  Never have "just one" cigarette. It leads to wanting another and another. Remind yourself of your decision to quit.  On a card, make a list of your reasons for not smoking. Read it at least the same number of times a day as you have a cigarette. Tell yourself everyday, "I do not want to smoke. I choose not to smoke."  Ask someone at home or work to help you with your plan to quit smoking.  Have something planned after you eat or have a cup of coffee. Take a walk or get  other exercise to perk you up. This will help to keep you from overeating.  Try a relaxation exercise to calm you down and decrease your stress. Remember, you may be tense and nervous the first two weeks after you quit. This will pass.  Find new activities to keep your hands busy. Play with a pen, coin, or rubber band. Doodle or draw things on paper.  Brush your teeth right after eating. This will help cut down the craving for the taste of tobacco after meals. You can try mouthwash too.  Try gum, breath mints, or diet  candy to keep something in your mouth. IF YOU SMOKE AND WANT TO QUIT:  Do not stock up on cigarettes. Never buy a carton. Wait until one pack is finished before you buy another.  Never carry cigarettes with you at work or at home.  Keep cigarettes as far away from you as possible. Leave them with someone else.  Never carry matches or a lighter with you.  Ask yourself, "Do I need this cigarette or is this just a reflex?"  Bet with someone that you can quit. Put cigarette money in a piggy bank every morning. If you smoke, you give up the money. If you do not smoke, by the end of the week, you keep the money.  Keep trying. It takes 21 days to change a habit!  Talk to your doctor about using medicines to help you quit. These include nicotine replacement gum, lozenges, or skin patches.   This information is not intended to replace advice given to you by your health care provider. Make sure you discuss any questions you have with your health care provider.   Document Released: 04/14/2009 Document Revised: 09/10/2011 Document Reviewed: 04/14/2009 Elsevier Interactive Patient Education [employer].

## 2015-08-29 NOTE — Progress Notes (Signed)
Patient ID: [name], female   DOB: [birth year], 55 y.o.   MRN: [id number]     Subjective:   Patient ID: [name] female    DOB: [birth year] 55 y.o.    MRN: [id number] Health Maintenance Due: There are no preventive care reminders to display for this patient.  _________________________________________________  HPI: [name] is a 55 y.o. female here for a routine visit.  Pt has a PMH outlined below.  Please see problem-based charting assessment and plan for further status of patient's chronic medical problems addressed at today's visit.  PMH: Past Medical History  Diagnosis Date  . Hypothyroidism   . Cocaine abuse   . ETOH abuse     Medications: No current outpatient prescriptions on file prior to visit.   No current facility-administered medications on file prior to visit.    Allergies: No Known Allergies  FH: No family history on file.  SH: Social History   Social History  . Marital Status: Single    Spouse Name: N/A  . Number of Children: N/A  . Years of Education: N/A   Social History Main Topics  . Smoking status: Current Every Day Smoker -- 0.30 packs/day    Types: Cigarettes  . Smokeless tobacco: Not on file  . Alcohol Use: No  . Drug Use: No     Comment: crack  . Sexual Activity: Yes    Birth Control/ Protection: None   Other Topics Concern  . Not on file   Social History Narrative    Review of Systems: Constitutional: Negative for fever, chills and weight loss.  Eyes: Negative for blurred vision.  Respiratory: Negative for cough and shortness of breath.  Cardiovascular: Negative for chest pain, palpitations and leg swelling.  Gastrointestinal: Negative for nausea, vomiting, abdominal pain, diarrhea, constipation and blood in stool.  Genitourinary: Negative for dysuria, urgency and frequency.  Musculoskeletal: Negative for myalgias and back pain.  Neurological: Negative for dizziness, weakness and headaches.       Objective:   Vital Signs: Filed Vitals:   08/29/15 1343  BP: 125/78  Pulse: 76  Temp: 98 F (36.7 C)  TempSrc: Oral  Height: 5' 2.4" (1.585 m)  Weight: 123 lb 8 oz (56.019 kg)  SpO2: 98%      BP Readings from Last 3 Encounters:  08/29/15 125/78  04/21/15 110/70  02/14/15 116/75    Physical Exam: Constitutional: Vital signs reviewed.  Patient is in NAD and cooperative with exam.  Head: Normocephalic and atraumatic. Eyes: EOMI, conjunctivae nl, no scleral icterus.  Neck: Supple. Cardiovascular: RRR, no MRG. Pulmonary/Chest: normal effort, CTAB, no wheezes, rales, or rhonchi. Abdominal: Soft. NT/ND +BS. Neurological: A&O x3, cranial nerves II-XII are grossly intact, moving all extremities. Extremities: 2+DP b/l; no LE edema. Skin: Warm, dry and intact. No rash.   Assessment & Plan:   Assessment and plan was discussed and formulated with my attending.

## 2015-08-29 NOTE — Assessment & Plan Note (Signed)
A: Hypothyroidism P: -cont synthroid 38mcg qd

## 2015-08-29 NOTE — Assessment & Plan Note (Addendum)
A: Elevated transaminases, unclear etiology.  Hepatitis panel OK.  No tylenol use.  Does use cocaine occasionally.  Last use was several months ago.  Reports drinking about 40oz beer/wk.   P: -repeat liver profile  -may need RUQ [place]

## 2015-08-30 ENCOUNTER — Encounter: Payer: Self-pay | Admitting: Internal Medicine

## 2015-08-30 LAB — HEPATIC FUNCTION PANEL
ALT: 60 IU/L — AB (ref 0–32)
AST: 56 IU/L — AB (ref 0–40)
Albumin: 4.7 g/dL (ref 3.5–5.5)
Alkaline Phosphatase: 80 IU/L (ref 39–117)
BILIRUBIN TOTAL: 0.4 mg/dL (ref 0.0–1.2)
BILIRUBIN, DIRECT: 0.11 mg/dL (ref 0.00–0.40)
Total Protein: 7.7 g/dL (ref 6.0–8.5)

## 2015-08-30 NOTE — Assessment & Plan Note (Signed)
-  needs colonoscopy but has no insurance -will need to get the orange card

## 2015-09-05 ENCOUNTER — Ambulatory Visit: Payer: Self-pay

## 2015-09-08 NOTE — Progress Notes (Signed)
Internal Medicine Clinic Attending  Case discussed with Dr. Gordy Levan soon after the resident saw the patient.  We reviewed the resident's history and exam and pertinent patient test results.  I agree with the assessment, diagnosis, and plan of care documented in the resident's note.  Patient with persistently elevated transaminases. Would consider RUQ sono

## 2015-12-02 ENCOUNTER — Telehealth: Payer: Self-pay | Admitting: Internal Medicine

## 2015-12-02 NOTE — Telephone Encounter (Signed)
CALLED PT, LMTCB, SHE NEEDS TO DO APP FOR NEW [facility] CARD FOR REFERRAL TO GYNECOLOGIC ONCOLOGY

## 2015-12-12 ENCOUNTER — Encounter: Payer: Self-pay | Admitting: *Deleted

## 2016-03-20 ENCOUNTER — Telehealth: Payer: Self-pay | Admitting: Internal Medicine

## 2016-03-20 NOTE — Telephone Encounter (Signed)
PT. REMINDER CALL, NO ANSWER, NO VOICEMAIL

## 2016-03-21 ENCOUNTER — Ambulatory Visit: Payer: Self-pay

## 2016-06-21 ENCOUNTER — Telehealth: Payer: Self-pay | Admitting: Internal Medicine

## 2016-06-21 NOTE — Telephone Encounter (Signed)
APT. REMINDER CALL, NO ANSWER, NO VOICEMAIL °

## 2016-06-22 ENCOUNTER — Encounter (INDEPENDENT_AMBULATORY_CARE_PROVIDER_SITE_OTHER): Payer: Self-pay

## 2016-06-22 ENCOUNTER — Encounter: Payer: Self-pay | Admitting: Internal Medicine

## 2016-06-22 ENCOUNTER — Ambulatory Visit (INDEPENDENT_AMBULATORY_CARE_PROVIDER_SITE_OTHER): Payer: Self-pay | Admitting: Internal Medicine

## 2016-06-22 VITALS — BP 140/59 | HR 102 | Temp 98.0°F | Ht 62.4 in | Wt 142.7 lb

## 2016-06-22 DIAGNOSIS — E785 Hyperlipidemia, unspecified: Secondary | ICD-10-CM

## 2016-06-22 DIAGNOSIS — F1011 Alcohol abuse, in remission: Secondary | ICD-10-CM

## 2016-06-22 DIAGNOSIS — F17200 Nicotine dependence, unspecified, uncomplicated: Secondary | ICD-10-CM

## 2016-06-22 DIAGNOSIS — Z0000 Encounter for general adult medical examination without abnormal findings: Secondary | ICD-10-CM

## 2016-06-22 DIAGNOSIS — Z79899 Other long term (current) drug therapy: Secondary | ICD-10-CM

## 2016-06-22 DIAGNOSIS — F1411 Cocaine abuse, in remission: Secondary | ICD-10-CM

## 2016-06-22 DIAGNOSIS — K0889 Other specified disorders of teeth and supporting structures: Secondary | ICD-10-CM

## 2016-06-22 DIAGNOSIS — K089 Disorder of teeth and supporting structures, unspecified: Secondary | ICD-10-CM

## 2016-06-22 DIAGNOSIS — F17210 Nicotine dependence, cigarettes, uncomplicated: Secondary | ICD-10-CM

## 2016-06-22 DIAGNOSIS — I10 Essential (primary) hypertension: Secondary | ICD-10-CM

## 2016-06-22 DIAGNOSIS — F1910 Other psychoactive substance abuse, uncomplicated: Secondary | ICD-10-CM

## 2016-06-22 DIAGNOSIS — E890 Postprocedural hypothyroidism: Secondary | ICD-10-CM

## 2016-06-22 MED ORDER — LEVOTHYROXINE SODIUM 75 MCG PO TABS: 75.0000 ug | ORAL_TABLET | Freq: Every day | ORAL | 1 refills | Status: DC

## 2016-06-22 NOTE — Progress Notes (Signed)
   CC: Medication refill, hypothyroidism  HPI:  [name] is a 55 y.o. female with PMHx detailed below presenting for medication refills, specifically her levothyroxine for her hypothyroidism. She reports that she has made some positive life changes and is now living in a shelter in [place]. She has not drank alcohol or used crack cocaine in 2 months since making this move. She reports that she is seeing providers there who are managing her "bipolar disorder" and she has been taking Citalopram.   See problem based assessment and plan below for additional details.  Past Medical History:  Diagnosis Date  . Cocaine abuse   . ETOH abuse   . Hypothyroidism   . Unspecified mood (affective) disorder (HCC)     Review of Systems: Review of Systems  Constitutional: Negative for chills, fever, malaise/fatigue and weight loss.  HENT:       Tooth pain  Respiratory: Negative for cough, sputum production and shortness of breath.   Cardiovascular: Negative for chest pain and palpitations.  Gastrointestinal: Negative for abdominal pain, constipation, diarrhea, nausea and vomiting.  Psychiatric/Behavioral: The patient is nervous/anxious.      Physical Exam: Vitals:   06/22/16 1436 06/22/16 1454  BP: (!) 171/94 (!) 140/59  Pulse: (!) 115 (!) 102  Temp: 98 F (36.7 C)   TempSrc: Oral   SpO2: 100%   Weight: 142 lb 11.2 oz (64.7 kg)   Height: 5' 2.4" (1.585 m)    Body mass index is 25.77 kg/m. GENERAL- Disheveled woman sitting comfortably in exam room chair, alert, in no distress, pleasant and conversational HEENT- Atraumatic, PERRL, moist mucous membranes, poor dentition, no carotid bruit CARDIAC- Regular rate and rhythm, no murmurs, rubs or gallops. RESP- Clear to ascultation bilaterally, no wheezing or crackles, normal work of breathing ABDOMEN- Soft, nontender, nondistended EXTREMITIES- Normal bulk and range of motion, no edema, 2+ peripheral pulses SKIN- Warm, dry,  intact, without visible rash PSYCH- Broad affect, clear speech, thoughts linear and goal-directed  Assessment & Plan:   See encounters tab for problem based medical decision making.  Patient seen with Dr. Daryll Drown

## 2016-06-22 NOTE — Patient Instructions (Addendum)
Please continue to stay active and avoid eating greasy and salty food.  Try to maintain your weight, getting overweight can put you at risk for diabetes and other medical problems.  We have provided your prescription for Levothyroxine.  Keep up the good work, avoid drinking and drugs.  Keep trying to cut down on smoking and quit.  Take care and happy holidays!

## 2016-06-26 ENCOUNTER — Encounter: Payer: Self-pay | Admitting: Internal Medicine

## 2016-06-26 DIAGNOSIS — K089 Disorder of teeth and supporting structures, unspecified: Secondary | ICD-10-CM | POA: Insufficient documentation

## 2016-06-26 DIAGNOSIS — E785 Hyperlipidemia, unspecified: Secondary | ICD-10-CM | POA: Insufficient documentation

## 2016-06-26 DIAGNOSIS — I10 Essential (primary) hypertension: Secondary | ICD-10-CM | POA: Insufficient documentation

## 2016-06-26 NOTE — Assessment & Plan Note (Signed)
Refused flu vaccine. Needs lipid panel at some point, she declined lab draw today but may be amenable to this at future visit. Also without insurance or orange card so minimizing cost is necessary.  Plan: - Check lipid panel at future visit - TDap at future visit

## 2016-06-26 NOTE — Assessment & Plan Note (Addendum)
Apparent on exam, she reports some difficulties with her teeth in the past but has been unable to see a dentist.  Plan: - Provide information on affordable option - [employer] [facility]

## 2016-06-26 NOTE — Assessment & Plan Note (Signed)
Provided printed refill for Levothyroxine 75 mcg daily. She reports that she plans to bring it to [facility] in [place] that will fill this prescription for her at no cost. She refused lab draw today - last TSH 2.376 in 01/2015. No overt symptoms of thyroid dysfunction.  Plan: - Continue levothyroxine 75 mcg QD - Obtain TSH at next visit if possible

## 2016-06-26 NOTE — Assessment & Plan Note (Signed)
Elevated LDL to 112 in 2013, total cholesterol 191. With hypertension and current everyday smoker. Declining lab draw today. ASCVD risk 6.6% based on previous results.  Plan: - Encouraged dietary modification, exercise - Lipid panel at future visit if possible

## 2016-06-26 NOTE — Assessment & Plan Note (Signed)
Reports has not used crack cocaine or drank alcohol since she moved to [place] two months ago - although she did drink one beer when she arrived in [place] earlier today Encouraged her to be very careful about relapse and to be on her guard when she returns to [place] - bad environment, bad influences and old acquaintances here for her.

## 2016-06-26 NOTE — Assessment & Plan Note (Signed)
Reports that she continues to smoke 1-2 cigarettes daily. Discussed and encouraged cessation.

## 2016-06-26 NOTE — Assessment & Plan Note (Addendum)
Initial BP 171/94, on recheck had improved to 140/59. No urgent need for antihypertensive medication at this time.

## 2016-06-27 NOTE — Progress Notes (Signed)
Internal Medicine Clinic Attending  I saw and evaluated the patient.  I personally confirmed the key portions of the history and exam documented by Dr. Johnson and I reviewed pertinent patient test results.  The assessment, diagnosis, and plan were formulated together and I agree with the documentation in the resident's note.  

## 2016-10-22 ENCOUNTER — Other Ambulatory Visit: Payer: Self-pay | Admitting: Internal Medicine

## 2016-10-22 ENCOUNTER — Other Ambulatory Visit: Payer: Self-pay | Admitting: *Deleted

## 2016-10-22 DIAGNOSIS — E890 Postprocedural hypothyroidism: Secondary | ICD-10-CM

## 2016-10-22 NOTE — Telephone Encounter (Signed)
levothyroxine (SYNTHROID, LEVOTHROID) 75 MCG tablet  Pt lost med needs refill Crisis [facility] in [place] [phone]

## 2016-10-23 MED ORDER — LEVOTHYROXINE SODIUM 75 MCG PO TABS: 75.0000 ug | ORAL_TABLET | Freq: Every day | ORAL | 1 refills | Status: DC

## 2016-10-24 ENCOUNTER — Encounter (HOSPITAL_COMMUNITY): Payer: Self-pay | Admitting: *Deleted

## 2016-10-24 ENCOUNTER — Encounter (HOSPITAL_COMMUNITY): Payer: Self-pay | Admitting: Emergency Medicine

## 2016-10-24 ENCOUNTER — Emergency Department (HOSPITAL_COMMUNITY)
Admission: EM | Admit: 2016-10-24 | Discharge: 2016-10-24 | Disposition: A | Discharge status: Home / Self Care | Payer: Medicaid Other | Attending: Emergency Medicine | Admitting: Emergency Medicine

## 2016-10-24 ENCOUNTER — Inpatient Hospital Stay (HOSPITAL_COMMUNITY)
Admission: RE | Admit: 2016-10-24 | Discharge: 2016-11-07 | DRG: 897 | Disposition: A | Discharge status: Other Acute Inpatient Hospital | Payer: No Typology Code available for payment source | Attending: Psychiatry | Admitting: Psychiatry

## 2016-10-24 DIAGNOSIS — F1910 Other psychoactive substance abuse, uncomplicated: Secondary | ICD-10-CM

## 2016-10-24 DIAGNOSIS — Z79899 Other long term (current) drug therapy: Secondary | ICD-10-CM | POA: Diagnosis not present

## 2016-10-24 DIAGNOSIS — F14120 Cocaine abuse with intoxication, uncomplicated: Secondary | ICD-10-CM | POA: Insufficient documentation

## 2016-10-24 DIAGNOSIS — F10920 Alcohol use, unspecified with intoxication, uncomplicated: Secondary | ICD-10-CM

## 2016-10-24 DIAGNOSIS — F329 Major depressive disorder, single episode, unspecified: Secondary | ICD-10-CM | POA: Diagnosis present

## 2016-10-24 DIAGNOSIS — Z046 Encounter for general psychiatric examination, requested by authority: Secondary | ICD-10-CM | POA: Diagnosis present

## 2016-10-24 DIAGNOSIS — R45851 Suicidal ideations: Secondary | ICD-10-CM | POA: Diagnosis present

## 2016-10-24 DIAGNOSIS — Z560 Unemployment, unspecified: Secondary | ICD-10-CM

## 2016-10-24 DIAGNOSIS — F32A Depression, unspecified: Secondary | ICD-10-CM | POA: Diagnosis present

## 2016-10-24 DIAGNOSIS — F1024 Alcohol dependence with alcohol-induced mood disorder: Principal | ICD-10-CM | POA: Diagnosis present

## 2016-10-24 DIAGNOSIS — E039 Hypothyroidism, unspecified: Secondary | ICD-10-CM | POA: Diagnosis not present

## 2016-10-24 DIAGNOSIS — F17210 Nicotine dependence, cigarettes, uncomplicated: Secondary | ICD-10-CM | POA: Diagnosis present

## 2016-10-24 DIAGNOSIS — F332 Major depressive disorder, recurrent severe without psychotic features: Secondary | ICD-10-CM | POA: Diagnosis present

## 2016-10-24 DIAGNOSIS — E890 Postprocedural hypothyroidism: Secondary | ICD-10-CM | POA: Diagnosis present

## 2016-10-24 DIAGNOSIS — Z8679 Personal history of other diseases of the circulatory system: Secondary | ICD-10-CM

## 2016-10-24 DIAGNOSIS — I10 Essential (primary) hypertension: Secondary | ICD-10-CM | POA: Insufficient documentation

## 2016-10-24 DIAGNOSIS — F10120 Alcohol abuse with intoxication, uncomplicated: Secondary | ICD-10-CM | POA: Insufficient documentation

## 2016-10-24 DIAGNOSIS — F1490 Cocaine use, unspecified, uncomplicated: Secondary | ICD-10-CM | POA: Diagnosis present

## 2016-10-24 DIAGNOSIS — F411 Generalized anxiety disorder: Secondary | ICD-10-CM | POA: Diagnosis present

## 2016-10-24 DIAGNOSIS — Z8659 Personal history of other mental and behavioral disorders: Secondary | ICD-10-CM

## 2016-10-24 DIAGNOSIS — Z590 Homelessness: Secondary | ICD-10-CM

## 2016-10-24 DIAGNOSIS — Y908 Blood alcohol level of 240 mg/100 ml or more: Secondary | ICD-10-CM | POA: Diagnosis present

## 2016-10-24 DIAGNOSIS — G4700 Insomnia, unspecified: Secondary | ICD-10-CM | POA: Diagnosis present

## 2016-10-24 DIAGNOSIS — F1094 Alcohol use, unspecified with alcohol-induced mood disorder: Secondary | ICD-10-CM | POA: Diagnosis present

## 2016-10-24 LAB — COMPREHENSIVE METABOLIC PANEL
ALK PHOS: 102 U/L (ref 38–126)
ALT: 68 U/L — ABNORMAL HIGH (ref 14–54)
ANION GAP: 13 (ref 5–15)
AST: 60 U/L — ABNORMAL HIGH (ref 15–41)
Albumin: 4.6 g/dL (ref 3.5–5.0)
BILIRUBIN TOTAL: 0.3 mg/dL (ref 0.3–1.2)
BUN: 7 mg/dL (ref 6–20)
CALCIUM: 9.4 mg/dL (ref 8.9–10.3)
CO2: 24 mmol/L (ref 22–32)
Chloride: 104 mmol/L (ref 101–111)
Creatinine, Ser: 0.72 mg/dL (ref 0.44–1.00)
GFR calc Af Amer: >60 mL/min (ref >60)
GFR calc non Af Amer: >60 mL/min (ref >60)
Glucose, Bld: 118 mg/dL — ABNORMAL HIGH (ref 65–99)
POTASSIUM: 3.5 mmol/L (ref 3.5–5.1)
Sodium: 141 mmol/L (ref 135–145)
TOTAL PROTEIN: 8.1 g/dL (ref 6.5–8.1)

## 2016-10-24 LAB — CBC
HCT: 41.5 % (ref 36.0–46.0)
Hemoglobin: 13.8 g/dL (ref 12.0–15.0)
MCH: 28 pg (ref 26.0–34.0)
MCHC: 33.3 g/dL (ref 30.0–36.0)
MCV: 84.2 fL (ref 78.0–100.0)
PLATELETS: 389 10*3/uL (ref 150–400)
RBC: 4.93 MIL/uL (ref 3.87–5.11)
RDW: 13.5 % (ref 11.5–15.5)
WBC: 8.5 10*3/uL (ref 4.0–10.5)

## 2016-10-24 LAB — RAPID URINE DRUG SCREEN, HOSP PERFORMED
Amphetamines: NOT DETECTED
BARBITURATES: NOT DETECTED
Benzodiazepines: NOT DETECTED
COCAINE: NOT DETECTED
OPIATES: NOT DETECTED
TETRAHYDROCANNABINOL: NOT DETECTED

## 2016-10-24 LAB — ETHANOL: Alcohol, Ethyl (B): 208 mg/dL — ABNORMAL HIGH (ref <5)

## 2016-10-24 MED ORDER — ACETAMINOPHEN 325 MG PO TABS: 650.0000 mg | ORAL_TABLET | Freq: Four times a day (QID) | ORAL | Status: DC | PRN

## 2016-10-24 MED ORDER — LORAZEPAM 1 MG PO TABS
1.0000 mg | ORAL_TABLET | Freq: Three times a day (TID) | ORAL | Status: AC
  Administered 2016-10-26 – 2016-10-27 (×3): 1 mg via ORAL
  Filled 2016-10-24 (×3): qty 1

## 2016-10-24 MED ORDER — ONDANSETRON 4 MG PO TBDP: 4.0000 mg | ORAL_TABLET | Freq: Four times a day (QID) | ORAL | Status: AC | PRN

## 2016-10-24 MED ORDER — LEVOTHYROXINE SODIUM 75 MCG PO TABS
75.0000 ug | ORAL_TABLET | Freq: Every day | ORAL | Status: DC
  Administered 2016-10-25 – 2016-10-31 (×7): 75 ug via ORAL
  Filled 2016-10-24: qty 3
  Filled 2016-10-24: qty 1
  Filled 2016-10-24: qty 3
  Filled 2016-10-24 (×5): qty 1

## 2016-10-24 MED ORDER — LOPERAMIDE HCL 2 MG PO CAPS: 2.0000 mg | ORAL_CAPSULE | ORAL | Status: AC | PRN

## 2016-10-24 MED ORDER — HYDROXYZINE HCL 25 MG PO TABS: 25.0000 mg | ORAL_TABLET | Freq: Four times a day (QID) | ORAL | Status: DC | PRN

## 2016-10-24 MED ORDER — TRAZODONE HCL 50 MG PO TABS
50.0000 mg | ORAL_TABLET | Freq: Every evening | ORAL | Status: DC | PRN
  Administered 2016-10-24 – 2016-10-26 (×3): 50 mg via ORAL
  Filled 2016-10-24 (×7): qty 1

## 2016-10-24 MED ORDER — ALUM & MAG HYDROXIDE-SIMETH 200-200-20 MG/5ML PO SUSP: 30.0000 mL | ORAL | Status: DC | PRN

## 2016-10-24 MED ORDER — LORAZEPAM 1 MG PO TABS
1.0000 mg | ORAL_TABLET | Freq: Four times a day (QID) | ORAL | Status: AC
  Administered 2016-10-24 – 2016-10-26 (×6): 1 mg via ORAL
  Filled 2016-10-24 (×6): qty 1

## 2016-10-24 MED ORDER — LORAZEPAM 1 MG PO TABS: 1.0000 mg | ORAL_TABLET | Freq: Four times a day (QID) | ORAL | Status: AC | PRN

## 2016-10-24 MED ORDER — THIAMINE HCL 100 MG/ML IJ SOLN
100.0000 mg | Freq: Once | INTRAMUSCULAR | Status: AC
  Administered 2016-10-24: 100 mg via INTRAMUSCULAR
  Filled 2016-10-24: qty 2

## 2016-10-24 MED ORDER — ADULT MULTIVITAMIN W/MINERALS CH
1.0000 | ORAL_TABLET | Freq: Every day | ORAL | Status: DC
  Administered 2016-10-25 – 2016-11-07 (×14): 1 via ORAL
  Filled 2016-10-24 (×16): qty 1

## 2016-10-24 MED ORDER — VITAMIN B-1 100 MG PO TABS
100.0000 mg | ORAL_TABLET | Freq: Every day | ORAL | Status: DC
  Administered 2016-10-25 – 2016-11-07 (×14): 100 mg via ORAL
  Filled 2016-10-24 (×16): qty 1

## 2016-10-24 MED ORDER — LORAZEPAM 1 MG PO TABS
1.0000 mg | ORAL_TABLET | Freq: Two times a day (BID) | ORAL | Status: AC
  Administered 2016-10-27 – 2016-10-28 (×2): 1 mg via ORAL
  Filled 2016-10-24 (×2): qty 1

## 2016-10-24 MED ORDER — MAGNESIUM HYDROXIDE 400 MG/5ML PO SUSP: 30.0000 mL | Freq: Every day | ORAL | Status: DC | PRN

## 2016-10-24 MED ORDER — LORAZEPAM 1 MG PO TABS
1.0000 mg | ORAL_TABLET | Freq: Every day | ORAL | Status: AC
  Administered 2016-10-29: 1 mg via ORAL
  Filled 2016-10-24: qty 1

## 2016-10-24 NOTE — ED Notes (Signed)
Pt refuses to leave . Given DC instructions and list of  resources

## 2016-10-24 NOTE — ED Triage Notes (Signed)
Pt sts was brought by [employer] . [employer] left before pt been transferred to [place]. Pt reports was at [facility] , sent to Ed for alcohol intoxication and cocaine detox. obvious ETOH on board. Ambulatory to TCU . Cooperative, loud and euphoric.

## 2016-10-24 NOTE — Telephone Encounter (Signed)
Dr. Wynetta Emery does not have any more openings before he leaves in June.  Does the patient need to be seen in [facility] next month or wait for her new pcp assignment?

## 2016-10-24 NOTE — Discharge Instructions (Signed)
Substance Abuse Treatment Programs ° °Intensive Outpatient Programs °[facility]     °[address]      °[place], [place]                   °[phone]      ° °[facility] °[address] °[place], [place] °[phone] ° °[facility]     °(Inpatient and outpatient)     °[address]           °[phone]   ° °[facility] °[phone] (Suboxone and Methadone) ° °[address]      °[place], [place] [address]      °[phone]      ° °[address] °[place], [place] °[phone] ° °[facility] (Outpatient/Inpatient, [facility])    °(insurance only) [phone]      °       °[facility] (Groups & Residential) °[place], [place] °[phone] ° °   °[facility]     °[address]     °[place], [place]      °[phone]      ° °[facility] (for caregivers and family) °[address] °[place], [place] °[phone] ° ° ° ° ° °Residential Treatment Programs °[facility]      °[address], [place], [place] [address]  °([phone]      ° °[facility] °[address] [place], [place] [address] °[phone] ° °[facility]        °[phone]      ° °[facility] °[phone] ° °[facility]             °[address]                                         °[place], [place]                                                °[phone] or [phone]                              ° °[facility] [place] °[address] °[place], [address] °[phone] ° °[facility]    °[address]      °[place], [place]     °[phone]      ° °[facility] °[address] °[place], [place] °[phone] ° °[facility]   °[address]     °[place], [place] [address]     °[phone]      °Admissions: 8am-3pm M-F ° °[facility] °[address] °[place],  [place] °[phone] ° °[facility]: Residential Program (90 Days)   °[place], [place]      °336-725-8389 or [phone]    ° °[facility] °[place], [place] °(Walk in Hours over the weekend or by referral) ° °[facility] °[address], [place], [place] [address] °([phone] ° °Crisis Mobile: [facility]:  1-877-626-1772 (for crisis response 24 hours a day) °[facility] Hotline:      1-800-256-2452 °Outpatient Psychiatry and Counseling ° °[facility]: Mobile Crisis   Management 24 hours:  1-877-626-1772 ° °[facility] sliding scale fee and walk in schedule: M-F 8am-12pm/1pm-3pm °[address]  °[place], [place] [address] °[phone] ° °[facility] °[address] °[place], [place] [address] °[phone] ° °[facility] ([facility])- new patient walk-in appointments available Monday - Friday 8am -3pm.          °[address] °[place], [place] [address] °[phone] or crisis line- 336-676-6905 ° °[facility] Intensive Outpatient Therapy Program °[address] °[place], [place] [address] °336-832-9804 ° °[facility]                  °Crisis Services      °[phone]      °[address]     °[place], [place] [address]                ° °[facility]   °[facility] °[phone] °[address] °[place], [place] [address] ° ° °[facility]          °[address]  °[place], [place] [address]       °[phone] ° °[facility] °[address] °[place], [place] [address] °336-292-1510 ° °[facility]    °[address]    °[place], [place] [address]     °[phone]      ° °Parish McKinney, MD     °[address]     °[place] [address]     °[phone]     °  °[facility] °[address] °[place] [address] ° °[facility]     °[address]     °[place], [place]      °[phone]      ° °[facility] °Shamsher Ahluwalia, MD °[address] °[place], [place] [address] °[phone] ° °[facility]     °[address]     °[place], [place] [address]     °336-274-1237      ° °[facility] °[address] °[place], [place] [address] °336-854-4450 °Resources for Temporary Residential Assistance/Crisis Centers ° °DAY CENTERS °[facility] °M-F 8am-3pm   °[address] [place], [place] [address]   336-332-0824 °Services include: laundry, barbering, support groups, case management, phone  & computer access, showers, AA/NA mtgs, mental health/substance abuse nurse, job skills class, disability information, VA assistance, spiritual classes, etc.  ° °HOMELESS SHELTERS ° °[facility]     °[facility]   °[address], [place]     °[phone]       °       °[facility] (women and children)       °[address] °[place], [place] [address] °336-275-0820 °[email or web address] for application and process °Application Required ° °[facility]   °[address]    °[place] [address]     °[phone]       °             °[facility] °[address] °[place],  [address] °[phone] °336-235-0363(schedule application appt.) °Application Required ° °[facility] (women only)    °[address]     °[place],  [address]     °336-884-1039      °  Intake starts 6pm daily °Need valid ID, SSC, & Police report °[facility] [place] °[address] °[place], [place] °336-881-5420 °Application Required ° °[facility] (men only)     °[address]      °[place], [place]     °[phone]      ° °[facility] °(Pregnant women only) °[address] °[place], [place] °336-275-0206 ° °[facility]      °[address]      °[place], [place] [address]     °[phone]      °       °[facility] °[address] °[place], [place] °336-723-1848 °90 day commitment/SA/Application process ° °[facility](men only)     °[address]     °[place], [place]     °[phone]       °Check-in at 7pm     °       °[facility] °[address] °[place], [place] [address] °[phone] °Men/Women/Women and Children must be there by 7 pm ° °[facility] °[place], [place] °336-722-8721                ° °

## 2016-10-24 NOTE — ED Notes (Signed)
Bed: WA30 Expected date:  Expected time:  Means of arrival:  Comments: 
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
## 2016-10-24 NOTE — Progress Notes (Signed)
Admission Note:  D: Patient is a 56 year old female present as a walk-in to [facility]. Patient states she is here for the treatment of alcohol and "crack" abuse. Patient states she has a drinking problem and needs help to stop. Patient appear somehow agitated and animated. Presents with body odor and slurred speech (three front teeth missing). Patient denies pain, SI/HI, [facility] at this time. Patient reports being homeless as her biggest stressor. Patient denies hx of physical, sexual and emotional abuse and reports no support system.  A: Skin/body search done. No contraband found. Tattoos noted at left breast and right back shoulder. No wound/bruises noted. POC and unit policies explained and understanding verbalized. Consents obtained. Refused food and fluids offered. Stated all she wants is to sleep.  R: Patient had no additional questions or concerns.

## 2016-10-24 NOTE — H&P (Signed)
Behavioral Health Medical Screening Exam  [name] is an 56 y.o. female, presents unaccompanied after a medical evaluation at the [facility] ED, requesting alcohol detox and vague non descript depression with passive SI. She is denying any HI, AVH, paranoia, delusional thoughts. Sha cannot recall when her last drink was, but consumes 4 40 oz drinks daily. She denies any illicit drug use. She has a hx of Schizophrenia and bipolar depression.  Total Time spent with patient: 20 minutes  Psychiatric Specialty Exam: Physical Exam  Constitutional: She is oriented to person, place, and time. She appears well-developed and well-nourished. No distress.  HENT:  Head: Normocephalic and atraumatic.  Eyes: Pupils are equal, round, and reactive to light.  Respiratory: Effort normal and breath sounds normal. No respiratory distress.  Neurological: She is alert and oriented to person, place, and time. No cranial nerve deficit.  Skin: Skin is warm and dry. She is not diaphoretic.  Psychiatric: Her speech is slurred. She is agitated. Cognition and memory are impaired. She expresses impulsivity. She exhibits a depressed mood. She expresses suicidal ideation.    Review of Systems  Constitutional: Negative.   Endo/Heme/Allergies:       Positive hypothyroidism  Psychiatric/Behavioral: Positive for depression, substance abuse and suicidal ideas.    There were no vitals taken for this visit.There is no height or weight on file to calculate BMI.  General Appearance: Disheveled  Eye Contact:  Poor  Speech:  Garbled  Volume:  Decreased  Mood:  Depressed and Hopeless  Affect:  Congruent  Thought Process:  Goal Directed  Orientation:  Full (Time, Place, and Person)  Thought Content:  Negative  Suicidal Thoughts:  Yes.  without intent/plan  Homicidal Thoughts:  No  Memory:  Immediate;   Poor  Judgement:  Poor  Insight:  Lacking  Psychomotor Activity:  Negative  Concentration: Concentration: Poor  Recall:   Poor  Fund of Knowledge:Poor  Language: Poor  Akathisia:  Negative  Handed:  Right  AIMS (if indicated):     Assets:  Desire for Improvement  Sleep:       Musculoskeletal: Strength & Muscle Tone: within normal limits Gait & Station: normal Patient leans: N/A  There were no vitals taken for this visit.  Recommendations:  Based on my evaluation the patient does not appear to have an emergency medical condition.  Laverle Hobby, PA-C 10/24/2016, 10:23 PM

## 2016-10-25 DIAGNOSIS — F1094 Alcohol use, unspecified with alcohol-induced mood disorder: Secondary | ICD-10-CM

## 2016-10-25 MED ORDER — CITALOPRAM HYDROBROMIDE 20 MG PO TABS
20.0000 mg | ORAL_TABLET | Freq: Every day | ORAL | Status: DC
  Administered 2016-10-25 – 2016-10-26 (×2): 20 mg via ORAL
  Filled 2016-10-25 (×4): qty 1

## 2016-10-25 NOTE — ED Provider Notes (Signed)
[facility] DEPT Provider Note   CSN: [id number] Arrival date & time: 10/24/16  1329     History   Chief Complaint Chief Complaint  Patient presents with  . Medical Clearance    HPI [name] is a 56 y.o. female.  HPI Pt comes in via [employer] and left at triage. [employer] didn't provide any history prior to leaving the patient per triage team. Pt allegedly was at [facility] and sent here due to her intoxication. Pt reports that she was at the party last night and did the usual drinking and drug use (cocaine). She has no desire to kill herself and has no hallucinations. She does want some info on rehab,but she is not sure if she wants to quit drinking. Pt denies nausea, emesis, fevers, chills, chest pains, shortness of breath, headaches, abdominal pain, uti like symptoms.   Past Medical History:  Diagnosis Date  . Cocaine abuse   . ETOH abuse   . Hypothyroidism   . Unspecified mood (affective) disorder [facility])     Patient Active Problem List   Diagnosis Date Noted  . 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 Poor dentition 06/26/2016  . Essential hypertension 06/26/2016  . Hyperlipidemia 06/26/2016  . Elevated transaminase level 08/29/2015  . Health care maintenance 12/31/2013  . Polysubstance abuse (h/o cocaine, ETOH, and tobacco abuse)    . Primary vulvar squamous cell carcinoma s/p resection 2011 02/13/2010  . 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 TOBACCO ABUSE 04/12/2006    Past Surgical History:  Procedure Laterality Date  . radioactive iodine ablation     graves disease s/p    OB History    Gravida Para Term Preterm AB Living   1 1 1     1    SAB TAB Ectopic Multiple Live Births                   Home Medications    Prior to Admission medications   Medication Sig Start Date End Date Taking? Authorizing Provider  escitalopram (LEXAPRO) 20 MG tablet Take 20 mg by mouth daily.    Historical Provider, MD  hydrOXYzine (VISTARIL) 50 MG capsule  Take 50 mg by mouth 3 (three) times daily as needed for anxiety.    Historical Provider, MD  levothyroxine (SYNTHROID, LEVOTHROID) 75 MCG tablet Take 1 tablet (75 mcg total) by mouth daily. 10/23/16   Asencion Partridge, MD    Family History History reviewed. No pertinent family history.  Social History Social History  Substance Use Topics  . Smoking status: Current Every Day Smoker    Packs/day: 0.30    Types: Cigarettes  . Smokeless tobacco: Never Used  . Alcohol use No     Allergies   Patient has no known allergies.   Review of Systems Review of Systems  All other systems reviewed and are negative.    Physical Exam Updated Vital Signs BP (!) 145/81   Pulse 91   Temp 98.1 F (36.7 C)   Resp 16   Ht 5\' 1"  (1.549 m)   Wt 137 lb (62.1 kg)   SpO2 99%   BMI 25.89 kg/m   Physical Exam  Constitutional: She is oriented to person, place, and time. She appears well-developed and well-nourished.  HENT:  Head: Normocephalic and atraumatic.  Eyes: EOM are normal. Pupils are equal, round, and reactive to light.  Neck: Neck supple.  Cardiovascular: Normal rate, regular rhythm and normal heart sounds.   No murmur heard. Pulmonary/Chest: Effort normal. No  respiratory distress.  Abdominal: Soft. She exhibits no distension. There is no tenderness. There is no rebound and no guarding.  Neurological: She is alert and oriented to person, place, and time.  Skin: Skin is warm and dry.  Psychiatric: She has a normal mood and affect. Her behavior is normal.  Nursing note and vitals reviewed.    ED Treatments / Results  Labs (all labs ordered are listed, but only abnormal results are displayed) Labs Reviewed  COMPREHENSIVE METABOLIC PANEL - Abnormal; Notable for the following:       Result Value   Glucose, Bld 118 (*)    AST 60 (*)    ALT 68 (*)    All other components within normal limits  ETHANOL - Abnormal; Notable for the following:    Alcohol, Ethyl (B) 208 (*)    All other  components within normal limits  CBC  RAPID URINE DRUG SCREEN, HOSP PERFORMED    EKG  EKG Interpretation None       Radiology No results found.  Procedures Procedures (including critical care time)  Medications Ordered in ED Medications - No data to display   Initial Impression / Assessment and Plan / ED Course  I have reviewed the triage vital signs and the nursing notes.  Pertinent labs & imaging results that were available during my care of the patient were reviewed by me and considered in my medical decision making (see chart for details).     Pt is intoxicated. WE will d/c when clinically sober. She has no SI/Hi/psychoses. No medical complains either.  Final Clinical Impressions(s) / ED Diagnoses   Final diagnoses:  Polysubstance abuse  Alcoholic intoxication without complication [facility])    New Prescriptions Discharge Medication List as of 10/24/2016  5:47 PM       Varney Biles, MD 10/25/16 1819

## 2016-10-25 NOTE — Progress Notes (Addendum)
D: Pt presents with flat affect and depressed mood. Pt rates depression 9/10. Anxiety 9/10. Pt denies active suicidal thoughts. Pt last endorsed suicidal thoughts yesterday. Pt reports that she's trying to stop using drugs and would like to received detox tx. Pt given Ativan per detox protocol. Pt requesting to be started back on "mental health meds, Celexa 50 mg/daily"  Will notify Margarita Grizzle, NP. A: Medications reviewed with pt. Medications administered as ordered per MD. Verbal support provided. Continuous monitoring for safety. R: Pt requesting detox on the adult unit.

## 2016-10-25 NOTE — Progress Notes (Signed)
A representative from [facility] stated they had a female detox bed available. Pt referral was sent to [facility] for review.

## 2016-10-25 NOTE — H&P (Signed)
[facility] Provider Admission PAA/H&P  Patient Identification: [name] MRN:  [id number] Date of Evaluation:  10/25/2016 Chief Complaint:  mdd  alcohol use disorder Principal Diagnosis: Alcohol-induced mood disorder ([place])  Diagnosis:   Patient Active Problem List   Diagnosis Date Noted  . Alcohol-induced mood disorder ([place]) [F10.94] 10/24/2016  . Poor dentition [K08.9] 06/26/2016  . Essential hypertension [I10] 06/26/2016  . Hyperlipidemia [E78.5] 06/26/2016  . Elevated transaminase level [R74.0] 08/29/2015  . Health care maintenance [Z00.00] 12/31/2013  . Polysubstance abuse (h/o cocaine, ETOH, and tobacco abuse)  [F19.10]   . Primary vulvar squamous cell carcinoma s/p resection 2011 [C51.9] 02/13/2010  . 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 TOBACCO ABUSE [F17.200] 04/12/2006   History of Present Illness: [name] is an 56 y.o. female, presents unaccompanied after a medical evaluation at the [facility] ED, requesting alcohol detox and vague non descript depression with passive SI. She is denying any HI, AVH, paranoia, delusional thoughts. Sha cannot recall when her last drink was, but consumes 4 40 oz drinks daily. She denies any illicit drug use. She has a hx of Schizophrenia and bipolar depression.  Associated Signs/Symptoms: Depression Symptoms:  depressed mood, fatigue, feelings of worthlessness/guilt, hopelessness, loss of energy/fatigue, (Hypo) Manic Symptoms:  Impulsivity, Anxiety Symptoms:  Excessive Worry, Psychotic Symptoms:  none present PTSD Symptoms: NA Total Time spent with patient: 20 minutes  Past Psychiatric History: Alcohol induced mood disorder, Polysubstance abuse  Is the patient at risk to self? No.  Has the patient been a risk to self in the past 6 months? No.  Has the patient been a risk to self within the distant past? No.  Is the patient a risk to others? No.  Has the patient been a risk to others in the past 6 months?  No.  Has the patient been a risk to others within the distant past? No.   Prior Inpatient Therapy: Prior Inpatient Therapy: No Prior Therapy Dates:  (None reported) Prior Therapy Facilty/Provider(s): None reported Reason for Treatment: None reported Prior Outpatient Therapy: Prior Outpatient Therapy: Yes Prior Therapy Dates: Once a month Prior Therapy Facilty/Provider(s): [facility] Reason for Treatment: Depression Does patient have an ACCT team?: No Does patient have Intensive In-House Services?  : No Does patient have [facility] services? : Yes Does patient have P4[facility] services?: No  Alcohol Screening:   Substance Abuse History in the last 12 months:  Yes.   Consequences of Substance Abuse: Medical Consequences:  Por[facility] health and dentition Legal Consequences:  Upcoming court date on 5/3 for littering and open container on city property Family Consequences:  homelessness Previous Psychotropic Medications: No Psychological Evaluations: No Past Medical History:  Past Medical History:  Diagnosis Date  . Cocaine abuse   . ETOH abuse   . Hypothyroidism   . Unspecified mood (affective) disorder [facility])     Past Surgical History:  Procedure Laterality Date  . radioactive iodine ablation     graves disease s/p   Family History: History reviewed. No pertinent family history. Family Psychiatric History: Unknown Tobacco Screening:   Social History:  History  Alcohol Use No     History  Drug Use No    Comment: crack    Additional Social History: Marital status: Single    Pain Medications: Pt denies Prescriptions: Hydroxyine per spelling on the intake sheet, Pamoate, Olanzapine, and Levothyroxine Over the Counter: Pt denies History of alcohol / drug use?: Yes Longest period of sobriety (when/how long): Pt sts she has never  been without either Negative Consequences of Use: Financial Name of Substance 1: Alcohol 1 - Age of First Use: 17 1 - Amount (size/oz): 3-4 40 oz per day 1 -  Frequency: daily 1 - Duration: 39 years 1 - Last Use / Amount: 10/23/16 Name of Substance 2: Crack 2 - Age of First Use: 41 pt could not say for sure 2 - Amount (size/oz): $40 per day 2 - Frequency: daily 2 - Duration: 10 + years 2 - Last Use / Amount: 10/27/16                Allergies:  No Known Allergies Lab Results:  Results for orders placed or performed during the hospital encounter of 10/24/16 (from the past 48 hour(s))  Comprehensive metabolic panel     Status: Abnormal   Collection Time: 10/24/16  2:22 PM  Result Value Ref Range   Sodium 141 135 - 145 mmol/L   Potassium 3.5 3.5 - 5.1 mmol/L   Chloride 104 101 - 111 mmol/L   CO2 24 22 - 32 mmol/L   Glucose, Bld 118 (H) 65 - 99 mg/dL   BUN 7 6 - 20 mg/dL   Creatinine, Ser 0.72 0.44 - 1.00 mg/dL   Calcium 9.4 8.9 - 10.3 mg/dL   Total Protein 8.1 6.5 - 8.1 g/dL   Albumin 4.6 3.5 - 5.0 g/dL   AST 60 (H) 15 - 41 U/L   ALT 68 (H) 14 - 54 U/L   Alkaline Phosphatase 102 38 - 126 U/L   Total Bilirubin 0.3 0.3 - 1.2 mg/dL   GFR calc non Af Amer >60 >60 mL/min   GFR calc Af Amer >60 >60 mL/min    Comment: (NOTE) The eGFR has been calculated using the CKD EPI equation. This calculation has not been validated in all clinical situations. eGFR's persistently <60 mL/min signify possible Chronic Kidney Disease.    Anion gap 13 5 - 15  Ethanol     Status: Abnormal   Collection Time: 10/24/16  2:22 PM  Result Value Ref Range   Alcohol, Ethyl (B) 208 (H) <5 mg/dL    Comment:        LOWEST DETECTABLE LIMIT FOR SERUM ALCOHOL IS 5 mg/dL FOR MEDICAL PURPOSES ONLY   cbc     Status: None   Collection Time: 10/24/16  2:22 PM  Result Value Ref Range   WBC 8.5 4.0 - 10.5 K/uL   RBC 4.93 3.87 - 5.11 MIL/uL   Hemoglobin 13.8 12.0 - 15.0 g/dL   HCT 41.5 36.0 - 46.0 %   MCV 84.2 78.0 - 100.0 fL   MCH 28.0 26.0 - 34.0 pg   MCHC 33.3 30.0 - 36.0 g/dL   RDW 13.5 11.5 - 15.5 %   Platelets 389 150 - 400 K/uL  Rapid urine drug screen  (hospital performed)     Status: None   Collection Time: 10/24/16  2:22 PM  Result Value Ref Range   Opiates NONE DETECTED NONE DETECTED   Cocaine NONE DETECTED NONE DETECTED   Benzodiazepines NONE DETECTED NONE DETECTED   Amphetamines NONE DETECTED NONE DETECTED   Tetrahydrocannabinol NONE DETECTED NONE DETECTED   Barbiturates NONE DETECTED NONE DETECTED    Comment:        DRUG SCREEN FOR MEDICAL PURPOSES ONLY.  IF CONFIRMATION IS NEEDED FOR ANY PURPOSE, NOTIFY LAB WITHIN 5 DAYS.        LOWEST DETECTABLE LIMITS FOR URINE DRUG SCREEN Drug Class  Cutoff (ng/mL) Amphetamine      1000 Barbiturate      200 Benzodiazepine   378 Tricyclics       588 Opiates          300 Cocaine          300 THC              50     Blood Alcohol level:  Lab Results  Component Value Date   ETH 208 (H) 10/24/2016   ETH  12/25/2008    <5        LOWEST DETECTABLE LIMIT FOR SERUM ALCOHOL IS 5 mg/dL FOR MEDICAL PURPOSES ONLY    Metabolic Disorder Labs:  No results found for: HGBA1C, MPG No results found for: PROLACTIN Lab Results  Component Value Date   CHOL 191 09/12/2011   TRIG 64 09/12/2011   HDL 66 09/12/2011   CHOLHDL 2.9 09/12/2011   VLDL 13 09/12/2011   LDLCALC 112 (H) 09/12/2011   LDLCALC  12/26/2008    71        Total Cholesterol/HDL:CHD Risk Coronary Heart Disease Risk Table                     Men   Women  1/2 Average Risk   3.4   3.3  Average Risk       5.0   4.4  2 X Average Risk   9.6   7.1  3 X Average Risk  23.4   11.0        Use the calculated Patient Ratio above and the CHD Risk Table to determine the patient's CHD Risk.        ATP III CLASSIFICATION (LDL):  <100     mg/dL   Optimal  100-129  mg/dL   Near or Above                    Optimal  130-159  mg/dL   Borderline  160-189  mg/dL   High  >190     mg/dL   Very High    Current Medications: Current Facility-Administered Medications  Medication Dose Route Frequency Provider Last Rate Last Dose  .  acetaminophen (TYLENOL) tablet 650 mg  650 mg Oral Q6H PRN Laverle Hobby, PA-C      . alum & mag hydroxide-simeth (MAALOX/MYLANTA) 200-200-20 MG/5ML suspension 30 mL  30 mL Oral Q4H PRN Laverle Hobby, PA-C      . citalopram (CELEXA) tablet 20 mg  20 mg Oral Daily Ethelene Hal, NP   20 mg at 10/25/16 1224  . hydrOXYzine (ATARAX/VISTARIL) tablet 25 mg  25 mg Oral Q6H PRN Laverle Hobby, PA-C      . levothyroxine (SYNTHROID, LEVOTHROID) tablet 75 mcg  75 mcg Oral Daily Laverle Hobby, PA-C   75 mcg at 10/25/16 5027  . loperamide (IMODIUM) capsule 2-4 mg  2-4 mg Oral PRN Laverle Hobby, PA-C      . LORazepam (ATIVAN) tablet 1 mg  1 mg Oral Q6H PRN Laverle Hobby, PA-C      . LORazepam (ATIVAN) tablet 1 mg  1 mg Oral QID Laverle Hobby, PA-C   1 mg at 10/25/16 1143   Followed by  . [START ON 10/26/2016] LORazepam (ATIVAN) tablet 1 mg  1 mg Oral TID Laverle Hobby, PA-C       Followed by  . [START ON 10/27/2016] LORazepam (ATIVAN) tablet 1 mg  1 mg Oral BID Laverle Hobby, PA-C       Followed by  . [START ON 10/29/2016] LORazepam (ATIVAN) tablet 1 mg  1 mg Oral Daily Spencer E Simon, PA-C      . magnesium hydroxide (MILK OF MAGNESIA) suspension 30 mL  30 mL Oral Daily PRN Laverle Hobby, PA-C      . multivitamin with minerals tablet 1 tablet  1 tablet Oral Daily Laverle Hobby, PA-C   1 tablet at 10/25/16 [phone]  . ondansetron (ZOFRAN-ODT) disintegrating tablet 4 mg  4 mg Oral Q6H PRN Laverle Hobby, PA-C      . thiamine (VITAMIN B-1) tablet 100 mg  100 mg Oral Daily Laverle Hobby, PA-C   100 mg at 10/25/16 0932  . traZODone (DESYREL) tablet 50 mg  50 mg Oral QHS,MR X 1 Laverle Hobby, PA-C   50 mg at 10/24/16 2233   PTA Medications: Prescriptions Prior to Admission  Medication Sig Dispense Refill Last Dose  . escitalopram (LEXAPRO) 20 MG tablet Take 20 mg by mouth daily.   10/22/2016  . hydrOXYzine (VISTARIL) 50 MG capsule Take 50 mg by mouth 3 (three) times daily as needed for  anxiety.   10/22/2016  . levothyroxine (SYNTHROID, LEVOTHROID) 75 MCG tablet Take 1 tablet (75 mcg total) by mouth daily. 90 tablet 1 10/22/2016    Musculoskeletal: Strength & Muscle Tone: within normal limits Gait & Station: normal Patient leans: N/A  Psychiatric Specialty Exam: Physical Exam  Constitutional: She is oriented to person, place, and time. She appears well-developed and well-nourished.  Neurological: She is alert and oriented to person, place, and time.    Review of Systems  Psychiatric/Behavioral: Positive for depression and substance abuse. Negative for hallucinations, memory loss and suicidal ideas. The patient is not nervous/anxious and does not have insomnia.   All other systems reviewed and are negative.   Blood pressure (!) 111/49, pulse 76, temperature 98 F (36.7 C), temperature source Oral, resp. rate 16, height 5' 1" (1.549 m), weight 66.2 kg (146 lb), SpO2 97 %.Body mass index is 27.59 kg/m.  General Appearance: Disheveled  Eye Contact:  Fair  Speech:  Blocked and Normal Rate  Volume:  Decreased  Mood:  Depressed  Affect:  Congruent and Depressed  Thought Process:  Coherent and Linear  Orientation:  Full (Time, Place, and Person)  Thought Content:  Logical  Suicidal Thoughts:  No  Homicidal Thoughts:  No  Memory:  Immediate;   Good Recent;   Fair Remote;   Fair  Judgement:  Fair  Insight:  Present  Psychomotor Activity:  Normal  Concentration:  Concentration: Good and Attention Span: Good  Recall:  [employer] of Knowledge:  Fair  Language:  Good  Akathisia:  No  Handed:  Right  AIMS (if indicated):     Assets:  Communication Skills Desire for Improvement Resilience  ADL's:  Intact  Cognition:  WNL  Sleep:   Fair      Treatment Plan Summary: Daily contact with patient to assess and evaluate symptoms and progress in treatment and Medication management  Pt will be referred out to area detox centers for help with substance abuse.    Observation Level/Precautions:  Continuous Observation Laboratory:  CBC Chemistry Profile UDS UA Medications:  See [facility] Discharge Concerns:  Access to rehab for detox, financial concerns, homelessness Estimated LOS:     Ethelene Hal, NP 4/26/20181:45 PM

## 2016-10-25 NOTE — Progress Notes (Signed)
D: Patient calm and cooperative on unit. Reports mild depression. Denies pain, SI/HI, AH/VH at this time. Requesting transfer to Adult unit for detox. In bed sleeping at this time.  A: Staff offered support and encouragement as needed. Due med given as ordered. Constant observation maintained for safety except when patient is in the bathroom. Will continue to monitor patient.  R: Patient remains safe on unit.

## 2016-10-26 DIAGNOSIS — G4700 Insomnia, unspecified: Secondary | ICD-10-CM | POA: Diagnosis present

## 2016-10-26 DIAGNOSIS — Z79899 Other long term (current) drug therapy: Secondary | ICD-10-CM | POA: Diagnosis not present

## 2016-10-26 DIAGNOSIS — F411 Generalized anxiety disorder: Secondary | ICD-10-CM | POA: Diagnosis present

## 2016-10-26 DIAGNOSIS — F332 Major depressive disorder, recurrent severe without psychotic features: Secondary | ICD-10-CM | POA: Diagnosis present

## 2016-10-26 DIAGNOSIS — Z8679 Personal history of other diseases of the circulatory system: Secondary | ICD-10-CM | POA: Diagnosis not present

## 2016-10-26 DIAGNOSIS — E890 Postprocedural hypothyroidism: Secondary | ICD-10-CM | POA: Diagnosis present

## 2016-10-26 DIAGNOSIS — R45851 Suicidal ideations: Secondary | ICD-10-CM | POA: Diagnosis present

## 2016-10-26 DIAGNOSIS — Z8659 Personal history of other mental and behavioral disorders: Secondary | ICD-10-CM | POA: Diagnosis not present

## 2016-10-26 DIAGNOSIS — F1094 Alcohol use, unspecified with alcohol-induced mood disorder: Secondary | ICD-10-CM | POA: Diagnosis present

## 2016-10-26 DIAGNOSIS — F17210 Nicotine dependence, cigarettes, uncomplicated: Secondary | ICD-10-CM | POA: Diagnosis present

## 2016-10-26 DIAGNOSIS — F329 Major depressive disorder, single episode, unspecified: Secondary | ICD-10-CM | POA: Diagnosis present

## 2016-10-26 DIAGNOSIS — Z560 Unemployment, unspecified: Secondary | ICD-10-CM | POA: Diagnosis not present

## 2016-10-26 DIAGNOSIS — F1024 Alcohol dependence with alcohol-induced mood disorder: Secondary | ICD-10-CM | POA: Diagnosis present

## 2016-10-26 DIAGNOSIS — Y908 Blood alcohol level of 240 mg/100 ml or more: Secondary | ICD-10-CM | POA: Diagnosis present

## 2016-10-26 DIAGNOSIS — F1490 Cocaine use, unspecified, uncomplicated: Secondary | ICD-10-CM | POA: Diagnosis present

## 2016-10-26 DIAGNOSIS — Z590 Homelessness: Secondary | ICD-10-CM | POA: Diagnosis not present

## 2016-10-26 DIAGNOSIS — F32A Depression, unspecified: Secondary | ICD-10-CM | POA: Diagnosis present

## 2016-10-26 MED ORDER — HYDROXYZINE HCL 25 MG PO TABS
25.0000 mg | ORAL_TABLET | Freq: Four times a day (QID) | ORAL | Status: DC | PRN
  Administered 2016-10-30: 25 mg via ORAL
  Filled 2016-10-26: qty 30
  Filled 2016-10-26: qty 1

## 2016-10-26 MED ORDER — MAGNESIUM HYDROXIDE 400 MG/5ML PO SUSP: 30.0000 mL | Freq: Every day | ORAL | Status: DC | PRN

## 2016-10-26 MED ORDER — CITALOPRAM HYDROBROMIDE 20 MG PO TABS
20.0000 mg | ORAL_TABLET | Freq: Every day | ORAL | Status: DC
  Administered 2016-10-27 – 2016-11-07 (×12): 20 mg via ORAL
  Filled 2016-10-26 (×5): qty 1
  Filled 2016-10-26: qty 21
  Filled 2016-10-26 (×8): qty 1

## 2016-10-26 MED ORDER — TRAZODONE HCL 50 MG PO TABS
50.0000 mg | ORAL_TABLET | Freq: Every evening | ORAL | Status: DC | PRN
Start: 2016-10-26 — End: 2016-10-27
  Administered 2016-10-26 – 2016-10-27 (×2): 50 mg via ORAL

## 2016-10-26 MED ORDER — ALUM & MAG HYDROXIDE-SIMETH 200-200-20 MG/5ML PO SUSP: 30.0000 mL | ORAL | Status: DC | PRN

## 2016-10-26 MED ORDER — ACETAMINOPHEN 325 MG PO TABS
650.0000 mg | ORAL_TABLET | Freq: Four times a day (QID) | ORAL | Status: DC | PRN
Start: 2016-10-26 — End: 2016-11-07

## 2016-10-26 NOTE — Tx Team (Signed)
Initial Treatment Plan 10/26/2016 1:17 PM Mitzie L Lanagan HAF:[id number]    PATIENT STRESSORS: Financial difficulties Medication change or noncompliance Substance abuse   PATIENT STRENGTHS: Capable of independent living Motivation for treatment/growth Supportive family/friends   PATIENT IDENTIFIED PROBLEMS: Depression  Anxiety  Substance abuse  "Get clean"  "find housing"             DISCHARGE CRITERIA:  Improved stabilization in mood, thinking, and/or behavior Motivation to continue treatment in a less acute level of care Verbal commitment to aftercare and medication compliance Withdrawal symptoms are absent or subacute and managed without 24-hour nursing intervention  PRELIMINARY DISCHARGE PLAN: Outpatient therapy Medication management  PATIENT/FAMILY INVOLVEMENT: This treatment plan has been presented to and reviewed with the patient, Kalle L Baksh.  The patient and family have been given the opportunity to ask questions and make suggestions.  Windell Moment, RN 10/26/2016, 1:17 PM

## 2016-10-26 NOTE — Progress Notes (Addendum)
Nursing Progress Note 1900-0100  D) Patient presents with mild anxiety but is calm and cooperative in the milieu. Patient states "I need to go to a long term treatment center, I need help". Patient denies SI/HI/AVH or pain.   A) Emotional support given. 1:1 interaction and active listening provided. Patient medicated with PM orders as prescribed. Medications reviewed with patient. Patient verbalized understanding of medications without further questions.  Snacks and fluids provided. Opportunities for questions or concerns presented to patient. Patient encouraged to continue to work on treatment goals. Labs, vital signs and patient behavior monitored throughout shift. Patient safety maintained with q15 min safety checks.  R) Patient receptive to interaction with nurse. Patient remains safe on the unit at this time. Patient denies any adverse medication reactions at this time. Patient is resting in bed without complaints. Will continue to monitor.

## 2016-10-26 NOTE — Progress Notes (Signed)
Pt discharged to Adult unit. Pt is calm and cooperative.  Patient presents with flat affect and depressed mood.  Medication given as prescribed.  Denies suicidal ideation, auditory and visual hallucinations.  Support and encouragements offered as needed.  Constant observation and safety maintained.  Verbalizes understanding for continued treatment as inpatient.  Patient  given opportunity to express concerns and ask questions.  Verbal reports given to accepting nurse on the unit.

## 2016-10-26 NOTE — Progress Notes (Signed)
Introduced self to pt in dayroom. Pt was soft-spoken and denied immediate needs. She denied current SI, HI, or AVH. Urged her to bring concerns, questions, or needs to staff. Pt did not go outside for recreation, but has instead been resting in her room with eyes closed and regular/even respirations. Fifteen minute checks in place. Will continue to monitor for needs/safety.

## 2016-10-26 NOTE — Progress Notes (Signed)
Patient did not attend group.

## 2016-10-26 NOTE — Progress Notes (Addendum)
Patient vol admitted from [facility] observation unit. Patient here seeking detox from alcohol (BAL 208) and reports using crack and THC (though UDS neg). Patient homeless and states one of her goals while here is to find housing. States she has run out of some of her meds but otherwise has been compliant.   Oriented to unit, belongings searched and allowed items given to her. Patient made high fall risk as she has a hx of falls. Education provided and interventions in place. Emotional support offered.  Patient verbalizes understanding of POC and fall prevention plan. Denies SI/HI/AVH and remains safe on level III obs.  Add: pt refused to complete ETOH Use Disorders Test.

## 2016-10-26 NOTE — Progress Notes (Signed)
Naziyah Spera is a 56 year old female who was admitted to the [facility] OBS unit for alcohol induced mood disorder  and  Depression. Pt was interested in a bed at [facility] but they refused saying because of her cocaine use she would need treatment and they only offer detox beds. Pt has been in the OBS unit without incident and has followed the rules of the unit. Pt appears anxious and depressed and is unable to contract for safety.  Pt would benefit from crisis stabilization and treatment for her depression.   Psychiatric Specialty Exam: Physical Exam  Constitutional: She is oriented to person, place, and time. She appears well-developed and well-nourished.  Neurological: She is alert and oriented to person, place, and time.    Review of Systems  Psychiatric/Behavioral: Positive for depression and substance abuse. Negative for hallucinations, memory loss and suicidal ideas. The patient is not nervous/anxious and does not have insomnia.   All other systems reviewed and are negative.   Blood pressure (!) 111/49, pulse 76, temperature 98 F (36.7 C), temperature source Oral, resp. rate 16, height 5\' 1"  (1.549 m), weight 66.2 kg (146 lb), SpO2 97 %.Body mass index is 27.59 kg/m.  General Appearance: Disheveled  Eye Contact:  Fair  Speech:  Blocked and Normal Rate  Volume:  Decreased  Mood:  Depressed  Affect:  Congruent and Depressed  Thought Process:  Coherent and Linear  Orientation:  Full (Time, Place, and Person)  Thought Content:  Logical  Suicidal Thoughts:  Yes when sleeping and they wake her up, unable to contract for safety  Homicidal Thoughts:  No  Memory:  Immediate;   Good Recent;   Fair Remote;   Fair  Judgement:  Fair  Insight:  Present  Psychomotor Activity:  Normal  Concentration:  Concentration: Good and Attention Span: Good  Recall:  [employer] of Knowledge:  Fair  Language:  Good  Akathisia:  No  Handed:  Right  AIMS (if indicated):     Assets:  Communication  Skills Desire for Improvement Resilience  ADL's:  Intact  Cognition:  WNL  Sleep:   Ashley Mariner, [place] 10-26-2016   [phone]

## 2016-10-27 DIAGNOSIS — R45851 Suicidal ideations: Secondary | ICD-10-CM

## 2016-10-27 DIAGNOSIS — F1290 Cannabis use, unspecified, uncomplicated: Secondary | ICD-10-CM

## 2016-10-27 DIAGNOSIS — F17210 Nicotine dependence, cigarettes, uncomplicated: Secondary | ICD-10-CM

## 2016-10-27 DIAGNOSIS — F329 Major depressive disorder, single episode, unspecified: Secondary | ICD-10-CM

## 2016-10-27 MED ORDER — TRAZODONE HCL 50 MG PO TABS
50.0000 mg | ORAL_TABLET | Freq: Every evening | ORAL | Status: DC | PRN
  Administered 2016-10-27 – 2016-10-28 (×3): 50 mg via ORAL
  Filled 2016-10-27 (×6): qty 1

## 2016-10-27 MED ORDER — RISPERIDONE 0.5 MG PO TABS
0.5000 mg | ORAL_TABLET | Freq: Every day | ORAL | Status: DC
  Administered 2016-10-27 – 2016-10-28 (×2): 0.5 mg via ORAL
  Filled 2016-10-27 (×4): qty 1

## 2016-10-27 NOTE — BHH Group Notes (Signed)
[place] Group Notes:  (Nursing/MHT/Case Management/Adjunct)  Date:  10/27/2016  Time:  0900 am  Type of Therapy:  Nurse Education  Participation Level:  Minimal  Participation Quality:  Attentive  Affect:  Anxious  Cognitive:  Lacking  Insight:  Lacking  Engagement in Group:  Poor  Modes of Intervention:  Support  Summary of Progress/Problems: Patient listened attentively, however, did not interact with staff or peers.  She appears anxious and depressed.  Zipporah Plants 10/27/2016, 10:01 AM

## 2016-10-27 NOTE — BHH Suicide Risk Assessment (Signed)
[facility] Admission Suicide Risk Assessment   Nursing information obtained from:    Demographic factors:    Current Mental Status:    Loss Factors:    Historical Factors:    Risk Reduction Factors:     Total Time spent with patient: 45 minutes Principal Problem: Substance Induced Mood Disorder.  Diagnosis:   Patient Active Problem List   Diagnosis Date Noted  . Depression [F32.9] 10/26/2016  . Alcohol-induced mood disorder with depressive symptoms ([place]) [F10.94] 10/26/2016  . Alcohol-induced mood disorder ([place]) [F10.94] 10/24/2016  . Poor dentition [K08.9] 06/26/2016  . Essential hypertension [I10] 06/26/2016  . Hyperlipidemia [E78.5] 06/26/2016  . Elevated transaminase level [R74.0] 08/29/2015  . Health care maintenance [Z00.00] 12/31/2013  . Polysubstance abuse (h/o cocaine, ETOH, and tobacco abuse)  [F19.10]   . Primary vulvar squamous cell carcinoma s/p resection 2011 [C51.9] 02/13/2010  . 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 TOBACCO ABUSE [F17.200] 04/12/2006   Subjective Data:  56 yo AAF, single, homeless, unemployed, no income. Background history of SUD and mood disorder. Presented to the ER in company of the local police. Patient was intoxicated with alcohol at presentation. BAL was 308 mg/dl. Patient reports feeling worthless and helpless. No support from her family. Has a grown son who dose not keep in touch with her. Says she drinks and uses drugs on a daily basis. Transient auditory hallucinations. Has suicidal thoughts off and on. No current suicidal thought. Wants to get some help. Risky life style. Past suicidal behavior. No access to weapons. Consented to use of low dose Risperidone after we explored the benefits and risks.   Continued Clinical Symptoms:  Alcohol Use Disorder Identification Test Final Score (AUDIT): 33 The "Alcohol Use Disorders Identification Test", Guidelines for Use in Primary Care, Second Edition.  World Pharmacologist [facility]). Score  between 0-7:  no or low risk or alcohol related problems. Score between 8-15:  moderate risk of alcohol related problems. Score between 16-19:  high risk of alcohol related problems. Score 20 or above:  warrants further diagnostic evaluation for alcohol dependence and treatment.   CLINICAL FACTORS:  Substance Use Disorder Substance induced mood disorder   Musculoskeletal: Strength & Muscle Tone: within normal limits Gait & Station: normal Patient leans: N/A  Psychiatric Specialty Exam: Physical Exam  Constitutional: She is oriented to person, place, and time. She appears well-developed and well-nourished.  HENT:  Head: Normocephalic and atraumatic.  Eyes: Conjunctivae are normal. Pupils are equal, round, and reactive to light.  Neck: Normal range of motion. Neck supple.  Cardiovascular: Normal rate and regular rhythm.   Respiratory: Effort normal and breath sounds normal.  GI: Soft. Bowel sounds are normal.  Musculoskeletal: Normal range of motion.  Neurological: She is alert and oriented to person, place, and time.  Skin: Skin is warm and dry.  Psychiatric:  As above    ROS  Blood pressure 105/63, pulse 80, temperature 98.7 F (37.1 C), temperature source Oral, resp. rate 18, height 5\' 1"  (1.549 m), weight 68.7 kg (151 lb 8 oz), SpO2 97 %.Body mass index is 28.63 kg/m.  General Appearance: Disheveled, watching TV prior to interview. Good rapport. Polite.   Eye Contact:  Good  Speech:  Clear and Coherent and Normal Rate  Volume:  Normal  Mood:  Depressed and Irritable  Affect:  Restricted  Thought Process:  Linear  Orientation:  Full (Time, Place, and Person)  Thought Content:  Rumination  Suicidal Thoughts:  Yes.  without intent/plan  Homicidal Thoughts:  No  Memory:  Immediate;   Fair Recent;   Fair Remote;   Fair  Judgement:  Poor  Insight:  Shallow  Psychomotor Activity:  Normal  Concentration:  Concentration: Fair and Attention Span: Fair  Recall:  [employer] of Knowledge:  Fair  Language:  Good  Akathisia:  No  Handed:    AIMS (if indicated):     Assets:  Communication Skills Desire for Improvement  ADL's:  Impaired  Cognition:  WNL  Sleep:  Number of Hours: 6.75      COGNITIVE FEATURES THAT CONTRIBUTE TO RISK:  None    SUICIDE RISK:   Moderate:  Frequent suicidal ideation with limited intensity, and duration, some specificity in terms of plans, no associated intent, good self-control, limited dysphoria/symptomatology, some risk factors present, and identifiable protective factors, including available and accessible social support.  PLAN OF CARE:   1. Recommence Citalopram at home dose 2. Risperidone 0.5 mg HS 3. Suicide precautions.   I certify that inpatient services furnished can reasonably be expected to improve the patient's condition.   [place], MD 10/27/2016, 3:27 PM

## 2016-10-27 NOTE — Plan of Care (Signed)
Problem: Safety: Goal: Periods of time without injury will increase Outcome: Progressing Patient contracts for safety on the unit and is on Level 3 Precautions: q 15 minute safety checks.   

## 2016-10-27 NOTE — [facility] Suicide Risk Assessment (Signed)
[place] INPATIENT:  Family/Significant Other Suicide Prevention Education   There was no suicidal ideation at the time of admission, so no need for Suicide Prevention Education.  Berlin Hun Grossman-Orr 10/27/2016, 4:52 PM
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
## 2016-10-27 NOTE — BHH Group Notes (Signed)
[facility] Group Notes: (Clinical Social Work)   10/27/2016      Type of Therapy:  Group Therapy   Participation Level:  Did Not Attend despite MHT prompting   Selmer Dominion, LCSW 10/27/2016, 12:02 PM
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
## 2016-10-27 NOTE — Progress Notes (Signed)
[name] is quiet, has spent much of her day resting quietly in her bed but is pleasant and cooperatve at dinnertime when she comes for her meds. A She completed her daily assessment today and she rated her depression, hopelessness and anxiety " 0/0/  ", respectively. She says the ativan she is receiving is " helping a" and she deneis experiencing withdrawal complications. R Safety in place.

## 2016-10-28 MED ORDER — NICOTINE 21 MG/24HR TD PT24
MEDICATED_PATCH | TRANSDERMAL | Status: AC
  Administered 2016-10-28: 08:00:00
  Filled 2016-10-28: qty 1

## 2016-10-28 MED ORDER — NICOTINE 21 MG/24HR TD PT24
21.0000 mg | MEDICATED_PATCH | Freq: Every day | TRANSDERMAL | Status: DC
  Administered 2016-10-28 – 2016-11-07 (×11): 21 mg via TRANSDERMAL
  Filled 2016-10-28 (×10): qty 1
  Filled 2016-10-28: qty 21
  Filled 2016-10-28: qty 1

## 2016-10-28 NOTE — [facility] Group Notes (Signed)
[place] LCSW Group Therapy Note     10/28/2016 at 10 AM   Type of Therapy and Topic: Group Therapy: Feelings Around Returning Home & Establishing a Supportive Framework and Activity to Identify signs of Improvement or Decompensation   Participation Level:  Did Not Attend; invited to participate yet did not despite overhead announcement and encouragement by staff   Sheilah Pigeon, LCSW

## 2016-10-28 NOTE — Progress Notes (Signed)
D. Fabiha had been up and visible in milieu this evening, did attend and participate in evening group activity. Tenita spoke of her day and spoke about feeling ok, endorsed some difficulties with sleep and did not endorse any withdrawal symptoms. Camira did receive various bedtime medications without incident. A. Support and encouragement provided. R. Safety maintained, will continue to monitor.

## 2016-10-28 NOTE — Progress Notes (Signed)
[name] is seen out in the milieu today...she is quiet. Says" give me my medicine" every time this writer sees her and is liited in her insight and awareness of her disease processes. A She completed her daily assesment and on it she wrote she has experienced SI and she rated her depression, hopelessness and anxiety " 8/9/2", respectively. She wants what she wants and expects medicines to " fix me" . R Safety in place and poc cont.

## 2016-10-28 NOTE — Progress Notes (Signed)
[facility] MD Progress Note  10/28/2016 11:01 AM [name]  MRN:  [id number] Subjective: patient reports " I am feeling okay, I am always depressed."  Objective: [name] is awake, alert and oriented *3. Patient seen standing in the hall, interacting with peers. Denies suicidal or homicidal ideation during this assessment. Reports auditory hallucinations. Denies that voice are command in nature. denies visual hallucination and does not appear to be responding to internal stimuli.  Patient reports she is medication compliant without mediation side effects.  Reports he depression 7/10. Reports good appetite and resting well. Support, encouragement and reassurance was provided.   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 Depression [F32.9] 10/26/2016  . 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 Alcohol-induced mood disorder ([place]) [F10.94] 10/24/2016  . Poor dentition [K08.9] 06/26/2016  . Essential hypertension [I10] 06/26/2016  . Hyperlipidemia [E78.5] 06/26/2016  . Elevated transaminase level [R74.0] 08/29/2015  . Health care maintenance [Z00.00] 12/31/2013  . Polysubstance abuse (h/o cocaine, ETOH, and tobacco abuse)  [F19.10]   . Primary vulvar squamous cell carcinoma s/p resection 2011 [C51.9] 02/13/2010  . 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 TOBACCO ABUSE [F17.200] 04/12/2006   Total Time spent with patient: 45 minutes  Past Psychiatric History:   Past Medical History:  Past Medical History:  Diagnosis Date  . Cocaine abuse   . ETOH abuse   . Hypothyroidism   . Unspecified mood (affective) disorder [facility])     Past Surgical History:  Procedure Laterality Date  . radioactive iodine ablation     graves disease s/p   Family History: History reviewed. No pertinent family history. Family Psychiatric  History:   Social History:  History  Alcohol Use No     History  Drug Use No    Comment: crack    Social History   Social History  . Marital status: Single    Spouse name: N/A  . Number of children: N/A  . Years of education: N/A   Social History Main Topics  . Smoking status: Current Every Day Smoker    Packs/day: 0.30    Types: Cigarettes  . Smokeless tobacco: Never Used  . Alcohol use No  . Drug use: No     Comment: crack  . Sexual activity: Yes    Partners: Male    Birth control/ protection: None   Other Topics Concern  . None   Social History Narrative  . None   Additional Social History:    Pain Medications: Pt denies Prescriptions: Hydroxyine per spelling on the intake sheet, Pamoate, Olanzapine, and Levothyroxine Over the Counter: Pt denies History of alcohol / drug use?: Yes Longest period of sobriety (when/how long): Pt sts she has never been without either Negative Consequences of Use: Financial Name of Substance 1: Alcohol 1 - Age of First Use: 17 1 - Amount (size/oz): 3-4 40 oz per day 1 - Frequency: daily 1 - Duration: 39 years 1 - Last Use / Amount: 10/23/16 Name of Substance 2: Crack 2 - Age of First Use: 41 pt could not say for sure 2 - Amount (size/oz): $40 per day 2 - Frequency: daily 2 - Duration: 10 + years 2 - Last Use / Amount: 10/27/16                Sleep: Fair  Appetite:  Fair  Current Medications: Current Facility-Administered Medications  Medication Dose Route Frequency Provider Last Rate Last Dose  . nicotine (NICODERM CQ - dosed in mg/24 hours) 21 mg/24hr patch           . acetaminophen (TYLENOL) tablet 650 mg  650 mg Oral Q6H PRN Ethelene Hal, NP      . alum & mag hydroxide-simeth (MAALOX/MYLANTA) 200-200-20 MG/5ML suspension 30 mL  30 mL Oral Q4H PRN Ethelene Hal, NP      . citalopram (CELEXA) tablet 20 mg  20 mg Oral Daily Ethelene Hal, NP   20 mg at 10/28/16 0734  . hydrOXYzine (ATARAX/VISTARIL)  tablet 25 mg  25 mg Oral Q6H PRN Ethelene Hal, NP      . levothyroxine (SYNTHROID, LEVOTHROID) tablet 75 mcg  75 mcg Oral Daily Laverle Hobby, PA-C   75 mcg at 10/28/16 0734  . [START ON 10/29/2016] LORazepam (ATIVAN) tablet 1 mg  1 mg Oral Daily Spencer E Simon, PA-C      . magnesium hydroxide (MILK OF MAGNESIA) suspension 30 mL  30 mL Oral Daily PRN Ethelene Hal, NP      . multivitamin with minerals tablet 1 tablet  1 tablet Oral Daily Laverle Hobby, PA-C   1 tablet at 10/28/16 0734  . nicotine (NICODERM CQ - dosed in mg/24 hours) patch 21 mg  21 mg Transdermal Daily Jenne Campus, MD   21 mg at 10/28/16 0733  . risperiDONE (RISPERDAL) tablet 0.5 mg  0.5 mg Oral QHS Artist Beach, MD   0.5 mg at 10/27/16 2103  . thiamine (VITAMIN B-1) tablet 100 mg  100 mg Oral Daily Laverle Hobby, PA-C   100 mg at 10/28/16 0734  . traZODone (DESYREL) tablet 50 mg  50 mg Oral QHS,MR X 1 Rozetta Nunnery, NP   50 mg at 10/27/16 2321    Lab Results: No results found for this or any previous visit (from the past 48 hour(s)).  Blood Alcohol level:  Lab Results  Component Value Date   ETH 208 (H) 10/24/2016   ETH  12/25/2008    <5        LOWEST DETECTABLE LIMIT FOR SERUM ALCOHOL IS 5 mg/dL FOR MEDICAL PURPOSES ONLY    Metabolic Disorder Labs: No results found for: HGBA1C, MPG No results found for: PROLACTIN Lab Results  Component Value Date   CHOL 191 09/12/2011   TRIG 64 09/12/2011   HDL 66 09/12/2011   CHOLHDL 2.9 09/12/2011   VLDL 13 09/12/2011   LDLCALC 112 (H) 09/12/2011   LDLCALC  12/26/2008    71        Total Cholesterol/HDL:CHD Risk Coronary Heart Disease Risk Table                     Men   Women  1/2 Average Risk   3.4   3.3  Average Risk       5.0   4.4  2 X Average Risk   9.6   7.1  3 X Average Risk  23.4   11.0        Use the calculated Patient Ratio above and the CHD Risk Table to determine the patient's CHD Risk.        ATP III CLASSIFICATION  (LDL):  <100     mg/dL   Optimal  100-129  mg/dL   Near or Above  Optimal  130-159  mg/dL   Borderline  160-189  mg/dL   High  >190     mg/dL   Very High    Physical Findings: AIMS: Facial and Oral Movements Muscles of Facial Expression: None, normal Lips and Perioral Area: None, normal Jaw: None, normal Tongue: None, normal,Extremity Movements Upper (arms, wrists, hands, fingers): None, normal Lower (legs, knees, ankles, toes): None, normal, Trunk Movements Neck, shoulders, hips: None, normal, Overall Severity Severity of abnormal movements (highest score from questions above): None, normal Incapacitation due to abnormal movements: None, normal Patient's awareness of abnormal movements (rate only patient's report): No Awareness, Dental Status Current problems with teeth and/or dentures?: No Does patient usually wear dentures?: No  CIWA:  CIWA-Ar Total: 0 COWS:     Musculoskeletal: Strength & Muscle Tone: within normal limits Gait & Station: unsteady (hip pain) Patient leans: N/A  Psychiatric Specialty Exam: Physical Exam  Vitals reviewed. Constitutional: She is oriented to person, place, and time.  Cardiovascular: Normal rate.   Neurological: She is alert and oriented to person, place, and time.  Psychiatric: She has a normal mood and affect. Her behavior is normal.    Review of Systems  Psychiatric/Behavioral: Positive for depression and hallucinations.    Blood pressure 125/81, pulse 84, temperature 97.3 F (36.3 C), temperature source Oral, resp. rate 16, height 5\' 1"  (1.549 m), weight 68.7 kg (151 lb 8 oz), SpO2 97 %.Body mass index is 28.63 kg/m.  General Appearance: Casual and Guarded  Eye Contact:  Good  Speech:  Clear and Coherent  Volume:  Normal  Mood:  Anxious and Depressed  Affect:  Appropriate  Thought Process:  Coherent  Orientation:  Full (Time, Place, and Person)  Thought Content:  Hallucinations: Auditory  Suicidal Thoughts:   No  Homicidal Thoughts:  No  Memory:  Immediate;   Fair Recent;   Fair Remote;   Fair  Judgement:  Fair  Insight:  Present  Psychomotor Activity:  Normal  Concentration:  Concentration: Fair  Recall:  [employer] of Knowledge:  Fair  Language:  Good  Akathisia:  No  Handed:  Right  AIMS (if indicated):     Assets:  Desire for Improvement Resilience Social Support  ADL's:  Intact  Cognition:  WNL  Sleep:  Number of Hours: 6.25     I agree with current treatment plan on 10/28/2016, Patient seen face-to-face for psychiatric evaluation follow-up, chart reviewed. Reviewed the information documented and agree with the treatment plan.  Treatment Plan Summary: Daily contact with patient to assess and evaluate symptoms and progress in treatment and Medication management   Continue treatment plan except where noted on 10/28/2016  Continue with Risperidone 0.5mg  for mood stabilization. continue Celexa 20 mg for mood stabilization  Started on CWIA/ Ativan  Protocol Will continue to monitor vitals ,medication compliance and treatment side effects while patient is here.  Reviewed labs BAL - 208,. CSW will start working on disposition.  Patient to participate in therapeutic milieu  Derrill Center, NP 10/28/2016, 11:01 AM

## 2016-10-28 NOTE — BHH Group Notes (Addendum)
Life SKills   Date:  10/27/2016  Time:  1300  Type of Therapy:  Nurse Education /  Idnentifying Needs: The group focuses on teaching patients how to identify their needs as well as develop the skills needed to get them met.   Participation Level:  Patient did not attend.  Participation Quality:    Affect:    Cognitive:    Insight:    Engagement in Group:    Modes of Intervention:    Summary of Progress/Problems:  [name] 10/27/2016

## 2016-10-28 NOTE — BHH Group Notes (Signed)
Healthy Support Systems  Date:  10/28/2016  Time:  1300  Type of Therapy:  Nurse Education / Healthy Support Systems:  The group focuses on teaching patients how to identify their unhealthy behaviors and how to establish and [employer].  Participation Level:  Active  Participation Quality:  Appropriate  Affect:  Appropriate  Cognitive:  Alert  Insight:  Limited  Engagement in Group:  Limited  Modes of Intervention:  Education  Summary of Progress/Problems:  [name] 10/28/2016, 3:15 PM

## 2016-10-29 DIAGNOSIS — F1420 Cocaine dependence, uncomplicated: Secondary | ICD-10-CM

## 2016-10-29 MED ORDER — TRAZODONE HCL 50 MG PO TABS
50.0000 mg | ORAL_TABLET | Freq: Every evening | ORAL | Status: DC | PRN
  Administered 2016-10-29: 50 mg via ORAL
  Filled 2016-10-29 (×7): qty 1

## 2016-10-29 MED ORDER — TRAZODONE HCL 50 MG PO TABS
50.0000 mg | ORAL_TABLET | Freq: Every evening | ORAL | Status: DC | PRN
  Administered 2016-10-29: 50 mg via ORAL

## 2016-10-29 MED ORDER — RISPERIDONE 1 MG PO TABS
1.0000 mg | ORAL_TABLET | Freq: Every day | ORAL | Status: DC
  Administered 2016-10-29: 1 mg via ORAL
  Filled 2016-10-29 (×3): qty 1

## 2016-10-29 NOTE — BHH Group Notes (Signed)
[place] LCSW Group Therapy  10/29/2016 1:15pm  Type of Therapy:  Group Therapy vercoming Obstacles  Participation Level:  Minimal  Participation Quality:  Attentive  Affect:  Flat  Cognitive:  Appropriate and Oriented  Insight:  Unable to assess  Engagement in Therapy:  Limited  Modes of Intervention:  Discussion, Exploration, Problem-solving and Support  Description of Group:   In this group patients will be encouraged to explore what they see as obstacles to their own wellness and recovery. They will be guided to discuss their thoughts, feelings, and behaviors related to these obstacles. The group will process together ways to cope with barriers, with attention given to specific choices patients can make. Each patient will be challenged to identify changes they are motivated to make in order to overcome their obstacles. This group will be process-oriented, with patients participating in exploration of their own experiences as well as giving and receiving support and challenge from other group members.  Summary of Patient Progress: Pt participated minimally in group discussion but reports that she can identify with another peer's frustration related to applying for disability.    Therapeutic Modalities:   Cognitive Behavioral Therapy Solution Focused Therapy Motivational Interviewing Relapse Prevention Therapy   Adriana Reams, LCSW 10/29/2016 2:47 PM

## 2016-10-29 NOTE — Progress Notes (Addendum)
[facility] MD Progress Note  10/29/2016 11:47 AM [name]  MRN:  [id number] Subjective:  Patient reports she is feeling better today. At this time does not endorse severe symptoms of withdrawal. Denies medication side effects.   Objective:  I have discussed case with treatment team and have met with patient. She is a 56 year old female, currently homeless, who presented requesting detoxification. She reported alcohol and cocaine dependencies. She states she has been drinking up to 4 40 ounce beers per day, and also using crack cocaine regularly . She reports she realized it was time to get help , as found herself homeless with minimal support.  At this time reports some ongoing depression, but denies any suicidal ideations and is future oriented, expressing interest in going to a residential rehab after discharge. Currently denies medication side effects. She denies any significant symptoms of alcohol WDL. Minimal/subtle distal tremors, no diaphoresis, no acute distress or discomfort. Vitals are stable. No disruptive or agitated behaviors on unit.   Principal Problem: Alcohol-induced mood disorder with depressive symptoms ([place]) Diagnosis:   Patient Active Problem List   Diagnosis Date Noted  . Depression [F32.9] 10/26/2016  . Alcohol-induced mood disorder with depressive symptoms ([place]) [F10.94] 10/26/2016  . Alcohol-induced mood disorder ([place]) [F10.94] 10/24/2016  . Poor dentition [K08.9] 06/26/2016  . Essential hypertension [I10] 06/26/2016  . Hyperlipidemia [E78.5] 06/26/2016  . Elevated transaminase level [R74.0] 08/29/2015  . Health care maintenance [Z00.00] 12/31/2013  . Polysubstance abuse (h/o cocaine, ETOH, and tobacco abuse)  [F19.10]   . Primary vulvar squamous cell carcinoma s/p resection 2011 [C51.9] 02/13/2010  . 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 TOBACCO ABUSE [F17.200] 04/12/2006   Total Time spent with patient: 20 minutes  Past Psychiatric History:    Past Medical History:  Past Medical History:  Diagnosis Date  . Cocaine abuse   . ETOH abuse   . Hypothyroidism   . Unspecified mood (affective) disorder [facility])     Past Surgical History:  Procedure Laterality Date  . radioactive iodine ablation     graves disease s/p   Family History: History reviewed. No pertinent family history. Family Psychiatric  History:  Social History:  History  Alcohol Use No     History  Drug Use No    Comment: crack    Social History   Social History  . Marital status: Single    Spouse name: N/A  . Number of children: N/A  . Years of education: N/A   Social History Main Topics  . Smoking status: Current Every Day Smoker    Packs/day: 0.30    Types: Cigarettes  . Smokeless tobacco: Never Used  . Alcohol use No  . Drug use: No     Comment: crack  . Sexual activity: Yes    Partners: Male    Birth control/ protection: None   Other Topics Concern  . None   Social History Narrative  . None   Additional Social History:    Pain Medications: Pt denies Prescriptions: Hydroxyine per spelling on the intake sheet, Pamoate, Olanzapine, and Levothyroxine Over the Counter: Pt denies History of alcohol / drug use?: Yes Longest period of sobriety (when/how long): Pt sts she has never been without either Negative Consequences of Use: Financial Name of Substance 1: Alcohol 1 - Age of First Use: 17 1 - Amount (size/oz): 3-4 40 oz per day 1 - Frequency: daily 1 - Duration: 39 years 1 - Last Use / Amount: 10/23/16 Name of Substance  2: Crack 2 - Age of First Use: 41 pt could not say for sure 2 - Amount (size/oz): $40 per day 2 - Frequency: daily 2 - Duration: 10 + years 2 - Last Use / Amount: 10/27/16  Sleep: improving   Appetite:  improving   Current Medications: Current Facility-Administered Medications  Medication Dose Route Frequency Provider Last Rate Last Dose  . acetaminophen (TYLENOL) tablet 650 mg  650 mg Oral Q6H PRN Ethelene Hal, NP      . alum & mag hydroxide-simeth (MAALOX/MYLANTA) 200-200-20 MG/5ML suspension 30 mL  30 mL Oral Q4H PRN Ethelene Hal, NP      . citalopram (CELEXA) tablet 20 mg  20 mg Oral Daily Ethelene Hal, NP   20 mg at 10/29/16 0759  . hydrOXYzine (ATARAX/VISTARIL) tablet 25 mg  25 mg Oral Q6H PRN Ethelene Hal, NP      . levothyroxine (SYNTHROID, LEVOTHROID) tablet 75 mcg  75 mcg Oral Daily Laverle Hobby, PA-C   75 mcg at 10/29/16 7893  . magnesium hydroxide (MILK OF MAGNESIA) suspension 30 mL  30 mL Oral Daily PRN Ethelene Hal, NP      . multivitamin with minerals tablet 1 tablet  1 tablet Oral Daily Laverle Hobby, PA-C   1 tablet at 10/29/16 0759  . nicotine (NICODERM CQ - dosed in mg/24 hours) patch 21 mg  21 mg Transdermal Daily Myer Peer Tichina Koebel, MD   21 mg at 10/29/16 0800  . risperiDONE (RISPERDAL) tablet 0.5 mg  0.5 mg Oral QHS Artist Beach, MD   0.5 mg at 10/28/16 2027  . thiamine (VITAMIN B-1) tablet 100 mg  100 mg Oral Daily Laverle Hobby, PA-C   100 mg at 10/29/16 0759  . traZODone (DESYREL) tablet 50 mg  50 mg Oral QHS,MR X 1 Rozetta Nunnery, NP   50 mg at 10/28/16 2207    Lab Results: No results found for this or any previous visit (from the past 48 hour(s)).  Blood Alcohol level:  Lab Results  Component Value Date   ETH 208 (H) 10/24/2016   ETH  12/25/2008    <5        LOWEST DETECTABLE LIMIT FOR SERUM ALCOHOL IS 5 mg/dL FOR MEDICAL PURPOSES ONLY    Metabolic Disorder Labs: No results found for: HGBA1C, MPG No results found for: PROLACTIN Lab Results  Component Value Date   CHOL 191 09/12/2011   TRIG 64 09/12/2011   HDL 66 09/12/2011   CHOLHDL 2.9 09/12/2011   VLDL 13 09/12/2011   LDLCALC 112 (H) 09/12/2011   LDLCALC  12/26/2008    71        Total Cholesterol/HDL:CHD Risk Coronary Heart Disease Risk Table                     Men   Women  1/2 Average Risk   3.4   3.3  Average Risk       5.0   4.4  2 X Average  Risk   9.6   7.1  3 X Average Risk  23.4   11.0        Use the calculated Patient Ratio above and the CHD Risk Table to determine the patient's CHD Risk.        ATP III CLASSIFICATION (LDL):  <100     mg/dL   Optimal  100-129  mg/dL   Near or Above  Optimal  130-159  mg/dL   Borderline  160-189  mg/dL   High  >190     mg/dL   Very High    Physical Findings: AIMS: Facial and Oral Movements Muscles of Facial Expression: None, normal Lips and Perioral Area: None, normal Jaw: None, normal Tongue: None, normal,Extremity Movements Upper (arms, wrists, hands, fingers): None, normal Lower (legs, knees, ankles, toes): None, normal, Trunk Movements Neck, shoulders, hips: None, normal, Overall Severity Severity of abnormal movements (highest score from questions above): None, normal Incapacitation due to abnormal movements: None, normal Patient's awareness of abnormal movements (rate only patient's report): No Awareness, Dental Status Current problems with teeth and/or dentures?: No Does patient usually wear dentures?: No  CIWA:  CIWA-Ar Total: 0 COWS:     Musculoskeletal: Strength & Muscle Tone: within normal limits Gait & Station: normal (hip pain) Patient leans: N/A  Psychiatric Specialty Exam: Physical Exam  Vitals reviewed. Constitutional: She is oriented to person, place, and time.  Cardiovascular: Normal rate.   Neurological: She is alert and oriented to person, place, and time.  Psychiatric: She has a normal mood and affect. Her behavior is normal.    Review of Systems  Psychiatric/Behavioral: Positive for depression and hallucinations.  denies chest pain, no shortness of breath, no diaphoresis, no fever  Blood pressure 130/72, pulse 79, temperature 97.3 F (36.3 C), temperature source Oral, resp. rate 16, height _0  (1.549 m), weight 68.7 kg (151 lb 8 oz), SpO2 97 %.Body mass index is 28.63 kg/m.  General Appearance: Fairly Groomed  Eye Contact:   Good  Speech:  Normal Rate  Volume:  Decreased  Mood:  reports feeling better, less depressed  Affect:  mildly constricted but reactive   Thought Process:  Linear and Descriptions of Associations: Intact  Orientation:  Full (Time, Place, and Person)  Thought Content:  reports some auditory hallucinations, telling her to " get out" ,  but at this time not internally preoccupied, no delusions expressed   Suicidal Thoughts:  No currently denies any suicidal plan or intention and contracts for safety on the unit   Homicidal Thoughts:  No  Memory: recent and remote grossly intact   Judgement:  Fair- improving   Insight:  Fair- improving   Psychomotor Activity:  Normal- no restlessness or agitation  Concentration:  Concentration: Fair and Attention Span: Fair  Recall:  Good  Fund of Knowledge:  Good  Language:  Good  Akathisia:  Negative  Handed:  Right  AIMS (if indicated):     Assets:  Desire for Improvement Resilience  ADL's:  Intact  Cognition:  WNL  Sleep:  Number of Hours: 7    Assessment - 56 year old female, homeless at this time, history of alcohol and cocaine dependencies. At this time reports improving mood, although still depressed . No current severe symptoms of alcohol WDL, and vitals are stable . She is denying any SI at this time and is future oriented, wanting to go to a Rehab at discharge   Treatment Plan Summary: Daily contact with patient to assess and evaluate symptoms and progress in treatment and Medication management   Continue treatment plan except where noted on 10/29/16 Encourage group and milieu participation to work on coping skills and symptom reduction Continue to encourage efforts to work on sobriety and relapse prevention  Treatment team working on disposition planning  Increase Risperidone to 1 mg QHS for psychotic symptoms Continue Celexa 20 mg QDAY  for depression Continue CIWA/ Ativan  Protocol to minimize risk of alcohol WDL symptoms Recheck  Hepatic Function Tests to follow up on elevated transaminases    Jenne Campus, MD 10/29/2016, 11:47 AM   Patient ID: Harmon Dun, female   DOB: [birth year], 56 y.o.   MRN: [id number]

## 2016-10-29 NOTE — Progress Notes (Signed)
Patient attended group and participated

## 2016-10-29 NOTE — Progress Notes (Signed)
Recreation Therapy Notes  Date: 10/29/16 Time: 0930 Location: [address] Dayroom  Group Topic: Stress Management  Goal Area(s) Addresses:  Patient will verbalize importance of using healthy stress management.  Patient will identify positive emotions associated with healthy stress management.   Behavioral Response: Engaged  Intervention: Stress Management  Activity :  Progressive Muscle Relaxation.  LRT read a script to guide patients through the stress management technique of progressive muscle relaxation.  Patients were to follow along as the script was read to engage in the technique.  Education:  Stress Management, Discharge Planning.   Education Outcome: Acknowledges edcuation/In group clarification offered/Needs additional education  Clinical Observations/Feedback:  Pt attended group.   Victorino Sparrow, LRT/CTRS         Victorino Sparrow A 10/29/2016 12:25 PM

## 2016-10-29 NOTE — BHH Group Notes (Signed)
Adult Psychoeducational Group Note  Date:  10/29/2016 Time:  10:55 PM  Group Topic/Focus:  Wrap-Up Group:   The focus of this group is to help patients review their daily goal of treatment and discuss progress on daily workbooks.  Participation Level:  None  Participation Quality:  Pt refused to participate  Affect:  Pt refused to participate  Cognitive:  Lacking  Insight: None  Engagement in Group:  Pt refused to participate  Modes of Intervention:  Education  Additional Comments:  Pt refused to participate in group.   Zettie Pho 10/29/2016, 10:55 PM

## 2016-10-29 NOTE — BHH Group Notes (Signed)
[facility] LCSW Aftercare Discharge Planning Group Note   10/29/2016  8:45 AM  Participation Quality: Pt invited. Did not attend.  Georga Kaufmann, MSW, LCSWA 10/29/2016 5:03 PM

## 2016-10-29 NOTE — Progress Notes (Signed)
[name] had been up and visible this evening, did attend and participate in evening group activity. She spoke of her day and reported having a good day but does still endorse depression and anxiety but does not display any overt signs or symptoms of withdrawal. Gudrun did receive all bedtime medications without incident and did not verbalize any complaints of pain. A. Support and encouragement provided. R. Safety maintained, will continue to monitor.

## 2016-10-29 NOTE — Plan of Care (Signed)
Problem: Activity: Goal: Sleeping patterns will improve Outcome: Progressing Patient reports she is sleeping well.

## 2016-10-29 NOTE — Progress Notes (Addendum)
Patient ID: [name], female   DOB: Mar 10, 1961, 56 y.o.   MRN: [id number]  DAR: Pt. Denies SI/HI and A/V Hallucinations this morning during shift assessment. However, on patient's daily inventory sheet she does report SI thoughts at times. She reports sleep is fair, appetite is fair, energy level is normal, and concentration is poor. She rates depression 2/10, hopelessness 5/10, and anxiety 3/10. Patient does not report any pain at this time. Support and encouragement provided to the patient to come to staff with any questions or concerns. Patient is minimal with Probation officer at this time but she is cooperative. Scheduled medication administered to patient per physician's orders. Patient is seen in the milieu intermittently an is interacting with peers. Q15 minute checks are maintained for safety.

## 2016-10-29 NOTE — Progress Notes (Signed)
Adult Psychoeducational Group Note  Date:  10/29/2016 Time:  11:30 AM  Group Topic/Focus:  Wellness Toolbox:   The focus of this group is to discuss various aspects of wellness, balancing those aspects and exploring ways to increase the ability to experience wellness.  Patients will create a wellness toolbox for use upon discharge.  Participation Level:  Active  Participation Quality:  Appropriate  Affect:  Appropriate  Cognitive:  Alert  Insight: Appropriate  Engagement in Group:  Engaged  Modes of Intervention:  Discussion  Additional Comments:  Patient did participate in group this morning.  Daija Routson R Xareni Kelch 10/29/2016, 11:30 AM

## 2016-10-30 LAB — HEPATIC FUNCTION PANEL
ALK PHOS: 70 U/L (ref 38–126)
ALT: 38 U/L (ref 14–54)
AST: 25 U/L (ref 15–41)
Albumin: 3.9 g/dL (ref 3.5–5.0)
Total Bilirubin: 0.4 mg/dL (ref 0.3–1.2)
Total Protein: 7.1 g/dL (ref 6.5–8.1)

## 2016-10-30 MED ORDER — TRAZODONE HCL 50 MG PO TABS
50.0000 mg | ORAL_TABLET | Freq: Every evening | ORAL | Status: DC | PRN
  Administered 2016-10-30: 50 mg via ORAL
  Filled 2016-10-30: qty 1

## 2016-10-30 MED ORDER — RISPERIDONE 2 MG PO TABS
2.0000 mg | ORAL_TABLET | Freq: Every day | ORAL | Status: DC
  Administered 2016-10-30 – 2016-11-06 (×8): 2 mg via ORAL
  Filled 2016-10-30 (×9): qty 1
  Filled 2016-10-30: qty 21

## 2016-10-30 NOTE — Progress Notes (Signed)
Per pt request, CSW sent a referral to [facility] for residential substance use treatment.  Georga Kaufmann, MSW, [place]

## 2016-10-30 NOTE — Progress Notes (Signed)
Recreation Therapy Notes   Animal-Assisted Activity (AAA) Program Checklist/Progress Notes Patient Eligibility Criteria Checklist & Daily Group note for Rec TxIntervention  Date: 05.01.2018 Time: 2:45pm Location: [address]   AAA/T Program Assumption of Risk Form signed by Patient/ or Parent Legal Guardian Yes  Patient is free of allergies or sever asthma Yes  Patient reports no fear of animals Yes  Patient reports no history of cruelty to animals Yes  Patient understands his/her participation is voluntary Yes  Patient washes hands before animal contact Yes  Patient washes hands after animal contact Yes  Behavioral Response: Did no attend.   Laureen Ochs Lacye Mccarn, LRT/CTRS      [name] L 10/30/2016 3:00 PM

## 2016-10-30 NOTE — Progress Notes (Signed)
Pt has been in the dayroom all evening watching TV.  She has had some interaction with peers and has been pleasant and cooperative with staff.  Pt makes her needs known to staff.  She denies having any withdrawal symptoms.  She denies SI/HI/AVH.  She is anxious about discharge and where she will be going.  She says that the CSW is working to find her a rehab for her substance addictions.  She makes her needs known to staff.  Support and encouragement offered.  Discharge plans are in process.  Safety maintained with q15 minute checks.

## 2016-10-30 NOTE — Tx Team (Signed)
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 acetaminophen (TYLENOL) tablet 650 mg  650 mg Oral Q6H PRN Ethelene Hal, NP      . alum & mag hydroxide-simeth (MAALOX/MYLANTA) 200-200-20 MG/5ML suspension 30 mL  30 mL Oral Q4H PRN Ethelene Hal, NP      . citalopram (CELEXA) tablet 20 mg  20 mg Oral Daily Ethelene Hal, NP   20 mg at 10/30/16 0813  . hydrOXYzine (ATARAX/VISTARIL) tablet 25 mg  25 mg Oral Q6H PRN Ethelene Hal, NP      . levothyroxine (SYNTHROID, LEVOTHROID) tablet 75 mcg  75 mcg Oral Daily Laverle Hobby, PA-C   75 mcg at 10/30/16 0867  . magnesium hydroxide (MILK OF MAGNESIA) suspension 30 mL  30 mL Oral Daily PRN Ethelene Hal, NP      . multivitamin with minerals tablet 1 tablet  1 tablet Oral Daily Laverle Hobby, PA-C   1 tablet at 10/30/16 0813  . nicotine (NICODERM CQ - dosed in mg/24 hours) patch 21 mg  21 mg Transdermal Daily Jenne Campus, MD   21 mg at 10/30/16 0814  . risperiDONE (RISPERDAL) tablet 1 mg  1 mg Oral QHS Jenne Campus, MD   1 mg at 10/29/16 2029  . thiamine (VITAMIN B-1) tablet 100 mg  100 mg Oral Daily Laverle Hobby, PA-C   100 mg at 10/30/16 0813  . traZODone (DESYREL) tablet 50 mg  50 mg Oral QHS,MR X 1 Laverle Hobby, PA-C   50 mg at 10/29/16 2158    PTA Medications: Prescriptions Prior to Admission  Medication Sig Dispense Refill Last Dose  . escitalopram (LEXAPRO) 20 MG tablet Take 20 mg by mouth daily.   10/22/2016  . hydrOXYzine  (VISTARIL) 50 MG capsule Take 50 mg by mouth 3 (three) times daily as needed for anxiety.   10/22/2016  . levothyroxine (SYNTHROID, LEVOTHROID) 75 MCG tablet Take 1 tablet (75 mcg total) by mouth daily. 90 tablet 1 10/22/2016    Treatment Modalities: Medication Management, Group therapy, Case management,  1 to 1 session with clinician, Psychoeducation, Recreational therapy.  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[place]) Long Term Goal(s): Improvement in symptoms so as ready for discharge Short Term Goals: Ability to identify changes in lifestyle to reduce recurrence of condition will improve Ability to verbalize feelings will improve Ability to disclose and discuss suicidal ideas Ability to maintain clinical measurements within normal limits will improve Compliance with prescribed medications will improve Ability to identify triggers associated with substance abuse/mental health issues will improve  Medication Management: Evaluate patient's response, side effects, and tolerance of medication regimen.  Therapeutic Interventions: 1 to 1 sessions, Unit Group sessions and Medication administration.  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[place]) Active Problems:   Alcohol-induced mood disorder ([place])   Depression  Long Term  Goal(s): Improvement in symptoms so as ready for discharge  Short Term Goals: Ability to identify changes in lifestyle to reduce recurrence of condition will improve Ability to verbalize feelings will improve Ability to disclose and discuss suicidal ideas Ability to maintain clinical  measurements within normal limits will improve Compliance with prescribed medications will improve Ability to identify triggers associated with substance abuse/mental health issues will improve  Medication Management: Evaluate patient's response, side effects, and tolerance of medication regimen.  Therapeutic Interventions: 1 to 1 sessions, Unit Group sessions and Medication administration.  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 RN will report any adverse and/or side effects to prescribing provider.  Therapeutic Interventions: 1 on 1 counseling sessions, Psychoeducation, Medication administration, Evaluate responses to treatment, Monitor vital signs and CBGs as ordered, Perform/monitor CIWA, COWS, AIMS and Fall Risk screenings as ordered, Perform wound care treatments as ordered.  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 Short Term Goals: Engage patient in aftercare planning with referrals and resources, Facilitate patient progression through stages of change regarding substance use diagnoses and concerns, Identify triggers associated with mental health/substance abuse issues and Increase skills for wellness and recovery  Therapeutic Interventions:  Assess for all discharge needs, 1 to 1 time with Social worker, Explore available resources and support systems, Assess for adequacy in community support network, Educate family and significant other(s) on suicide prevention, Complete Psychosocial Assessment, Interpersonal group therapy.  Evaluation of Outcomes: Progressing  Progress in Treatment: Attending groups: Yes  Participating in groups: Yes Taking medication as prescribed: Yes, MD continues to assess for medication changes as needed Toleration medication: Yes, no side effects reported at this time Family/Significant other contact made: No, pt declined contact. Patient understands diagnosis: Continuing to assess Discussing patient identified problems/goals with staff: Yes Medical problems stabilized or resolved: Yes Denies suicidal/homicidal ideation: Yes Issues/concerns per patient self-inventory: None Other: N/A  New problem(s) identified: None identified at this time.   New Short Term/Long Term Goal(s): None identified at this time.   Discharge Plan or Barriers: Pt will return home and follow-up inpatient with [facility] or [place]. [facility] referral sent 10/30/16, message left for [facility] 10/30/16.  Reason for Continuation of Hospitalization:  Anxiety  Depression Medication stabilization Suicidal ideation Withdrawal symptoms  Estimated Length of Stay: 3-5 days  Attendees: Patient: 10/30/2016 8:48 AM  Physician: Dr. Parke Poisson 10/30/2016 8:48 AM  Nursing: Trinna Post RN; [place], RN 10/30/2016 8:48 AM  RN Care Manager: Lars Pinks, RN 10/30/2016 8:48 AM  Social Worker: Matthew Saras, [place] 10/30/2016 8:48 AM  Recreational Therapist:  10/30/2016 8:48 AM  Other: Lindell Spar, NP; Samuel Jester, NP 10/30/2016 8:48 AM  Other:  10/30/2016 8:48 AM  Other: 10/30/2016 8:48 AM   Scribe for Treatment Team: Georga Kaufmann, MSW,LCSWA 10/30/2016 8:48 AM

## 2016-10-30 NOTE — [facility] Group Notes (Signed)
[place] Group Notes:  (Nursing/MHT/Case Management/Adjunct)  Date:  10/30/2016  Time:  2:02 PM  Type of Therapy:  Psychoeducational Skills  Participation Level:  Did Not Attend  Participation Quality:  N/A  Affect:  N/A  Cognitive:  N/A  Insight:  None  Engagement in Group:  None  Modes of Intervention:  Discussion and Education  Summary of Progress/Problems: Patient was invited to group but did not attend.   Demeshia Sherburne E 10/30/2016, 2:02 PM

## 2016-10-30 NOTE — Progress Notes (Signed)
[name] had been up and visible in milieu this evening, seen interacting appropriately with staff and peers in milieu. She spoke about feeling better, does not display any signs or symptoms of withdrawal and did speak about feeling a little depressed but denied any SI. She was able to receive medications without incident and did not verbalize any complaints of pain. A. Support and encouragement provided. R. Safety maintained, will continue to monitor.

## 2016-10-30 NOTE — Progress Notes (Signed)
[facility] MD Progress Note  10/30/2016 4:07 PM [name]  MRN:  [id number] Subjective:  She reports still feeling depressed, but acknowledges she is feeling better than on admission . She remains future oriented and focused on going to a rehab at discharge. At this time her major concern is ongoing insomnia.  She also reports some lingering auditory hallucinations, although decreasing compared to prior . Denies medication side effects. Reports alcohol cravings.  Objective:  I have discussed case with treatment team and have met with patient. Patient is presenting with partial improvement compared to admission - although still reports some depression, acknowledges she is feeling better overall, and presents with a somewhat improved range of affect. She is also noted to be more visible on unit, although interaction with peers remains limited. She is future oriented, and remains focused in going to a rehab program at discharge. States she is experiencing alcohol cravings and is concerned that discharge back to the community would likely result in relapse. Reports fair sleep.  No disruptive or agitated behaviors on unit. Currently no overt restlessness or distal tremors, vitals stable.  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 Depression [F32.9] 10/26/2016  . 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 Alcohol-induced mood disorder ([place]) [F10.94] 10/24/2016  . Poor dentition [K08.9] 06/26/2016  . Essential hypertension [I10] 06/26/2016  . Hyperlipidemia [E78.5] 06/26/2016  . Elevated transaminase level [R74.0] 08/29/2015  . Health care maintenance [Z00.00] 12/31/2013  . Polysubstance abuse (h/o cocaine, ETOH, and tobacco abuse)  [F19.10]   . Primary vulvar squamous cell carcinoma s/p resection 2011 [C51.9] 02/13/2010  . 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 TOBACCO ABUSE [F17.200] 04/12/2006   Total Time spent with patient: 20 minutes  Past Psychiatric History:   Past Medical History:  Past Medical History:  Diagnosis Date  . Cocaine abuse   . ETOH abuse   . Hypothyroidism   . Unspecified mood (affective) disorder [facility])     Past Surgical History:  Procedure Laterality Date  . radioactive iodine ablation     graves disease s/p   Family History: History reviewed. No pertinent family history. Family Psychiatric  History:  Social History:  History  Alcohol Use No     History  Drug Use No    Comment: crack    Social History   Social History  . Marital status: Single    Spouse name: N/A  . Number of children: N/A  . Years of education: N/A   Social History Main Topics  . Smoking status: Current Every Day Smoker    Packs/day: 0.30    Types: Cigarettes  . Smokeless tobacco: Never Used  . Alcohol use No  . Drug use: No     Comment: crack  . Sexual activity: Yes    Partners: Male    Birth control/ protection: None   Other Topics Concern  . None   Social History Narrative  . None   Additional Social History:    Pain Medications: Pt denies Prescriptions: Hydroxyine per spelling on the intake sheet, Pamoate, Olanzapine, and Levothyroxine Over the Counter: Pt denies History of alcohol / drug use?: Yes Longest period of sobriety (when/how long): Pt sts she has never been without either Negative Consequences of Use: Financial Name of Substance 1: Alcohol 1 - Age of First Use: 17 1 - Amount (size/oz): 3-4 40 oz per day 1 -  Frequency: daily 1 - Duration: 39 years 1 - Last Use / Amount: 10/23/16 Name of Substance 2: Crack 2 - Age of First Use: 41 pt could not say for sure 2 - Amount (size/oz): $40 per day 2 - Frequency: daily 2 - Duration: 10 + years 2 - Last Use / Amount: 10/27/16  Sleep: Fair  Appetite:  improving   Current Medications: Current Facility-Administered Medications   Medication Dose Route Frequency Provider Last Rate Last Dose  . acetaminophen (TYLENOL) tablet 650 mg  650 mg Oral Q6H PRN Ethelene Hal, NP      . alum & mag hydroxide-simeth (MAALOX/MYLANTA) 200-200-20 MG/5ML suspension 30 mL  30 mL Oral Q4H PRN Ethelene Hal, NP      . citalopram (CELEXA) tablet 20 mg  20 mg Oral Daily Ethelene Hal, NP   20 mg at 10/30/16 0813  . hydrOXYzine (ATARAX/VISTARIL) tablet 25 mg  25 mg Oral Q6H PRN Ethelene Hal, NP      . levothyroxine (SYNTHROID, LEVOTHROID) tablet 75 mcg  75 mcg Oral Daily Laverle Hobby, PA-C   75 mcg at 10/30/16 5361  . magnesium hydroxide (MILK OF MAGNESIA) suspension 30 mL  30 mL Oral Daily PRN Ethelene Hal, NP      . multivitamin with minerals tablet 1 tablet  1 tablet Oral Daily Laverle Hobby, PA-C   1 tablet at 10/30/16 0813  . nicotine (NICODERM CQ - dosed in mg/24 hours) patch 21 mg  21 mg Transdermal Daily Jenne Campus, MD   21 mg at 10/30/16 0814  . risperiDONE (RISPERDAL) tablet 2 mg  2 mg Oral QHS Fernando A Cobos, MD      . thiamine (VITAMIN B-1) tablet 100 mg  100 mg Oral Daily Laverle Hobby, PA-C   100 mg at 10/30/16 0813  . traZODone (DESYREL) tablet 50 mg  50 mg Oral QHS PRN Jenne Campus, MD        Lab Results:  Results for orders placed or performed during the hospital encounter of 10/24/16 (from the past 48 hour(s))  Hepatic function panel     Status: Abnormal   Collection Time: 10/30/16  6:32 AM  Result Value Ref Range   Total Protein 7.1 6.5 - 8.1 g/dL   Albumin 3.9 3.5 - 5.0 g/dL   AST 25 15 - 41 U/L   ALT 38 14 - 54 U/L   Alkaline Phosphatase 70 38 - 126 U/L   Total Bilirubin 0.4 0.3 - 1.2 mg/dL   Bilirubin, Direct <0.1 (L) 0.1 - 0.5 mg/dL   Indirect Bilirubin NOT CALCULATED 0.3 - 0.9 mg/dL    Comment: Performed at [facility], [place] [address] [place], [place] [address]    Blood Alcohol level:  Lab Results  Component Value Date   ETH 208  (H) 10/24/2016   [facility]  12/25/2008    <5        LOWEST DETECTABLE LIMIT FOR SERUM ALCOHOL IS 5 mg/dL FOR MEDICAL PURPOSES ONLY    Metabolic Disorder Labs: No results found for: HGBA1C, MPG No results found for: PROLACTIN Lab Results  Component Value Date   CHOL 191 09/12/2011   TRIG 64 09/12/2011   HDL 66 09/12/2011   CHOLHDL 2.9 09/12/2011   VLDL 13 09/12/2011   LDLCALC 112 (H) 09/12/2011   LDLCALC  12/26/2008    71        Total Cholesterol/HDL:CHD Risk Coronary Heart Disease Risk Table  Men   Women  1/2 Average Risk   3.4   3.3  Average Risk       5.0   4.4  2 X Average Risk   9.6   7.1  3 X Average Risk  23.4   11.0        Use the calculated Patient Ratio above and the CHD Risk Table to determine the patient's CHD Risk.        ATP III CLASSIFICATION (LDL):  <100     mg/dL   Optimal  100-129  mg/dL   Near or Above                    Optimal  130-159  mg/dL   Borderline  160-189  mg/dL   High  >190     mg/dL   Very High    Physical Findings: AIMS: Facial and Oral Movements Muscles of Facial Expression: None, normal Lips and Perioral Area: None, normal Jaw: None, normal Tongue: None, normal,Extremity Movements Upper (arms, wrists, hands, fingers): None, normal Lower (legs, knees, ankles, toes): None, normal, Trunk Movements Neck, shoulders, hips: None, normal, Overall Severity Severity of abnormal movements (highest score from questions above): None, normal Incapacitation due to abnormal movements: None, normal Patient's awareness of abnormal movements (rate only patient's report): No Awareness, Dental Status Current problems with teeth and/or dentures?: No Does patient usually wear dentures?: No  CIWA:  CIWA-Ar Total: 0 COWS:     Musculoskeletal: Strength & Muscle Tone: within normal limits Gait & Station: normal  Patient leans: N/A  Psychiatric Specialty Exam: Physical Exam  Vitals reviewed. Constitutional: She is oriented to  person, place, and time.  Cardiovascular: Normal rate.   Neurological: She is alert and oriented to person, place, and time.  Psychiatric: She has a normal mood and affect. Her behavior is normal.    Review of Systems  Psychiatric/Behavioral: Positive for depression and hallucinations.  denies vomiting , no fever, no chills   Blood pressure 115/60, pulse 82, temperature 98.5 F (36.9 C), temperature source Oral, resp. rate 16, height 5' 1" (1.549 m), weight 68.7 kg (151 lb 8 oz), SpO2 97 %.Body mass index is 28.63 kg/m.  General Appearance: improving grooming   Eye Contact:  Good  Speech:  Normal Rate  Volume:  soft   Mood:  improving mood  Affect:  less constricted, smiles appropriately at times   Thought Process:  Goal Directed and Descriptions of Associations: Intact  Orientation:  Other:  fully alert and attentive   Thought Content:  reports hallucinations, currently decreasing , but not completely resolved, not internally preoccupied, no delusions expressed   Suicidal Thoughts:  No currently denies any suicidal plan or intention and contracts for safety on the unit   Homicidal Thoughts:  No  Memory: recent and remote grossly intact    Judgement:  Other:  improving  Insight:  Fair- improving   Psychomotor Activity:  Normal-no significant distal tremors, no restlessness, no diaphoresis  Concentration:  Concentration: Fair and Attention Span: Fair  Recall:  [employer] of Knowledge:  Good  Language:  Good  Akathisia:  No  Handed:  Right  AIMS (if indicated):     Assets:  Desire for Improvement Resilience  ADL's:  Intact  Cognition:  WNL  Sleep:  Number of Hours: 7    Assessment - patient reports partial improvement , but reports some lingering, although decreased, depression, auditory hallucinations , insomnia. Not currently presenting with significant WDL  symptoms. She is future oriented and wants to go to a rehab at discharge. LFTs improved.   Treatment Plan  Summary: Daily contact with patient to assess and evaluate symptoms and progress in treatment and Medication management   Continue treatment plan except where noted on 10/30/16.  Encourage group and milieu participation to work on Radiographer, therapeutic and symptom reduction Continue to encourage efforts to work on sobriety and relapse prevention  Treatment team working on disposition planning- patient interested in going to rehab after discharge  Increase Risperidone to 2 mg QHS for ongoing  psychotic symptoms Continue Celexa 20 mg QDAY  for depression Trazodone 50 mgrs QHS PRN for insomnia if needed  Continue CIWA/ Ativan  Protocol to minimize risk of alcohol WDL symptoms    Jenne Campus, MD 10/30/2016, 4:07 PM   Patient ID: [name], female   DOB: 11-Jul-1960, 56 y.o.   MRN: [id number]

## 2016-10-30 NOTE — Progress Notes (Signed)
Patient attended the evening group session and answered all discussion prompts from this Probation officer. Patient stated her goal for the day was to find long term treatment. Patient rated her day a 9 out of 10 and her affect was appropriate.

## 2016-10-30 NOTE — Progress Notes (Addendum)
Patient ID: [name], female   DOB: 1961/04/18, 56 y.o.   MRN: [id number]  DAR: Pt. Denies HI and A/V Hallucinations. She reports passive SI but is able to contract for safety. She reports sleep is fair, appetite is fair, energy level is normal, and concentration is poor. She rates depression, hopelessness, and anxiety 2/10. She reports some cravings. Patient does not report any pain or discomfort at this time. Support and encouragement provided to the patient. Scheduled medications administered to patient per physician's orders. She is seen in the milieu but does not appear to be invested in treatment at the present as evidenced by not attending some groups but insisting that the television be changed several times throughout the day and left on even during group. She also is seen laying in her bed, resting during groups, electing not to participate in groups. Q15 minute checks are maintained for safety.

## 2016-10-31 MED ORDER — TRAZODONE HCL 100 MG PO TABS
100.0000 mg | ORAL_TABLET | Freq: Every evening | ORAL | Status: DC | PRN
  Administered 2016-10-31 – 2016-11-02 (×5): 100 mg via ORAL
  Filled 2016-10-31 (×5): qty 1

## 2016-10-31 MED ORDER — LEVOTHYROXINE SODIUM 75 MCG PO TABS
75.0000 ug | ORAL_TABLET | Freq: Every day | ORAL | Status: DC
  Administered 2016-11-01 – 2016-11-07 (×7): 75 ug via ORAL
  Filled 2016-10-31 (×6): qty 1
  Filled 2016-10-31: qty 21
  Filled 2016-10-31 (×2): qty 1

## 2016-10-31 MED ORDER — ACAMPROSATE CALCIUM 333 MG PO TBEC
666.0000 mg | DELAYED_RELEASE_TABLET | Freq: Three times a day (TID) | ORAL | Status: DC
  Administered 2016-10-31 – 2016-11-07 (×22): 666 mg via ORAL
  Filled 2016-10-31 (×27): qty 2

## 2016-10-31 NOTE — BHH Group Notes (Signed)
[facility] LCSW Group Therapy 10/31/2016 1:15 PM  Type of Therapy: Group Therapy- Emotion Regulation  Participation Level: Active   Participation Quality:  Appropriate  Affect: Appropriate  Cognitive: Alert and Oriented   Insight:  Developing/Improving  Engagement in Therapy: Developing/Improving and Engaged   Modes of Intervention: Clarification, Confrontation, Discussion, Education, Exploration, Limit-setting, Orientation, Problem-solving, Rapport Building, Art therapist, Socialization and Support  Summary of Progress/Problems: The topic for group today was emotional regulation. This group focused on both positive and negative emotion identification and allowed group members to process ways to identify feelings, regulate negative emotions, and find healthy ways to manage internal/external emotions. Group members were asked to reflect on a time when their reaction to an emotion led to a negative outcome and explored how alternative responses using emotion regulation would have benefited them. Group members were also asked to discuss a time when emotion regulation was utilized when a negative emotion was experienced. Pt states that she has a difficult time regulating her anger when she is drinking. Pt would like to get into rehab and maintain her sobriety when she discharges from the hospital. Pt was attentive for the duration of the group but did not contribute often to the discussion.   Georga Kaufmann, MSW, LCSWA 10/31/2016 3:55 PM

## 2016-10-31 NOTE — BHH Group Notes (Signed)
[facility] LCSW Aftercare Discharge Planning Group Note   10/31/2016  8:45 AM  Participation Quality:  Active   Mood/Affect:  Appropriate  Depression Rating:  Pt denies   Anxiety Rating:  Pt denies   Thoughts of Suicide:  No  Current AVH:  No  Plan for Discharge/Comments:  Pt plans to follow up at [facility] or [place]. Pt states that she feels "good" today and is feeling hopeful about discharge.   Transportation Means: Pt has access to transporation  Supports: Mental health providers   Georga Kaufmann, MSW, [place] 10/31/2016 11:05 AM

## 2016-10-31 NOTE — BHH Group Notes (Signed)
[facility] Group Therapy 10/30/2016 1:15pm  Type of Therapy: Mental Health Association Presentation  Participation Level: Active  Participation Quality: Attentive  Affect: Appropriate  Cognitive: Oriented  Insight: Developing/Improving  Engagement in Therapy: Engaged  Modes of Intervention: Discussion, Education and Socialization  Summary of Progress/Problems: Mental Health Association ([place]) Speaker came to talk about his personal journey with substance abuse and addiction. The pt processed ways by which to relate to the speaker. [place] speaker provided handouts and educational information pertaining to groups and services offered by the [facility]. Pt was engaged in speaker's presentation and was receptive to resources provided.    Kara Mead. Marshell Levan, MSW, [facility] 10/31/2016 10:49 AM

## 2016-10-31 NOTE — Progress Notes (Signed)
Patient ID: [name], female   DOB: 1961/03/07, 56 y.o.   MRN: [id number] D-Self inventory completed this am and her goal for today is to stay clean. She rates her depression as 2 and hopelessness as a 1 along with anxiety as a 1. She is animated and pleasant, states she would like to go to a rehab facility and her social worker has put in several referral for her for rehab. States she is content here and happy to wait.  A-Support offered. Monitored for safety and medications as ordered.  R-Attending groups as available. Positive peer interactions noted. No complaints voiced.

## 2016-10-31 NOTE — Progress Notes (Signed)
[facility] MD Progress Note  10/31/2016 4:35 PM [name]  MRN:  [id number] Subjective: patient reports improving mood. She remains focused on going to a rehab at discharge. States that her living environment is not conducive to sobriety as there is ample drug availability, and " they even give it to me for free if I want ". Reports ongoing motivation in sobriety, reports concerns about cravings for both alcohol and cocaine and " dreams where I am using crack".   Objective:  I have discussed case with treatment team and have met with patient. Presents improving mood and range of affect. Denies suicidal ideations . She is future oriented, and as above, wanting to go to Rehab at discharge, concerned about increased risk of relapsing if she does not go to a residential setting . She is tolerating medications well , denies medication side effects. We discussed medication options and she agrees to [facility] trial for alcohol cravings. No disruptive or agitated behaviors on unit .   Principal Problem: Alcohol-induced mood disorder with depressive symptoms ([place]) Diagnosis:   Patient Active Problem List   Diagnosis Date Noted  . Depression [F32.9] 10/26/2016  . Alcohol-induced mood disorder with depressive symptoms ([place]) [F10.94] 10/26/2016  . Alcohol-induced mood disorder ([employer]) [F10.94] 10/24/2016  . Poor dentition [K08.9] 06/26/2016  . Essential hypertension [I10] 06/26/2016  . Hyperlipidemia [E78.5] 06/26/2016  . Elevated transaminase level [R74.0] 08/29/2015  . Health care maintenance [Z00.00] 12/31/2013  . Polysubstance abuse (h/o cocaine, ETOH, and tobacco abuse)  [F19.10]   . Primary vulvar squamous cell carcinoma s/p resection 2011 [C51.9] 02/13/2010  . 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 TOBACCO ABUSE [F17.200] 04/12/2006   Total Time spent with patient: 20 minutes  Past Psychiatric History:   Past Medical History:  Past Medical History:  Diagnosis Date  . Cocaine  abuse   . ETOH abuse   . Hypothyroidism   . Unspecified mood (affective) disorder [facility])     Past Surgical History:  Procedure Laterality Date  . radioactive iodine ablation     graves disease s/p   Family History: History reviewed. No pertinent family history. Family Psychiatric  History:  Social History:  History  Alcohol Use No     History  Drug Use No    Comment: crack    Social History   Social History  . Marital status: Single    Spouse name: N/A  . Number of children: N/A  . Years of education: N/A   Social History Main Topics  . Smoking status: Current Every Day Smoker    Packs/day: 0.30    Types: Cigarettes  . Smokeless tobacco: Never Used  . Alcohol use No  . Drug use: No     Comment: crack  . Sexual activity: Yes    Partners: Male    Birth control/ protection: None   Other Topics Concern  . None   Social History Narrative  . None   Additional Social History:    Pain Medications: Pt denies Prescriptions: Hydroxyine per spelling on the intake sheet, Pamoate, Olanzapine, and Levothyroxine Over the Counter: Pt denies History of alcohol / drug use?: Yes Longest period of sobriety (when/how long): Pt sts she has never been without either Negative Consequences of Use: Financial Name of Substance 1: Alcohol 1 - Age of First Use: 17 1 - Amount (size/oz): 3-4 40 oz per day 1 - Frequency: daily 1 - Duration: 39 years 1 - Last Use / Amount: 10/23/16 Name of Substance 2: Crack  2 - Age of First Use: 41 pt could not say for sure 2 - Amount (size/oz): $40 per day 2 - Frequency: daily 2 - Duration: 10 + years 2 - Last Use / Amount: 10/27/16  Sleep: improving   Appetite:  improving   Current Medications: Current Facility-Administered Medications  Medication Dose Route Frequency Provider Last Rate Last Dose  . acamprosate (CAMPRAL) tablet 666 mg  666 mg Oral TID WC Jenne Campus, MD   666 mg at 10/31/16 1620  . acetaminophen (TYLENOL) tablet 650 mg   650 mg Oral Q6H PRN Ethelene Hal, NP      . alum & mag hydroxide-simeth (MAALOX/MYLANTA) 200-200-20 MG/5ML suspension 30 mL  30 mL Oral Q4H PRN Ethelene Hal, NP      . citalopram (CELEXA) tablet 20 mg  20 mg Oral Daily Ethelene Hal, NP   20 mg at 10/31/16 0757  . hydrOXYzine (ATARAX/VISTARIL) tablet 25 mg  25 mg Oral Q6H PRN Ethelene Hal, NP   25 mg at 10/30/16 2319  . [START ON 11/01/2016] levothyroxine (SYNTHROID, LEVOTHROID) tablet 75 mcg  75 mcg Oral Q0600 Myer Peer Pancho Rushing, MD      . magnesium hydroxide (MILK OF MAGNESIA) suspension 30 mL  30 mL Oral Daily PRN Ethelene Hal, NP      . multivitamin with minerals tablet 1 tablet  1 tablet Oral Daily Laverle Hobby, PA-C   1 tablet at 10/31/16 0757  . nicotine (NICODERM CQ - dosed in mg/24 hours) patch 21 mg  21 mg Transdermal Daily Jenne Campus, MD   21 mg at 10/31/16 0759  . risperiDONE (RISPERDAL) tablet 2 mg  2 mg Oral QHS Jenne Campus, MD   2 mg at 10/30/16 2118  . thiamine (VITAMIN B-1) tablet 100 mg  100 mg Oral Daily Laverle Hobby, PA-C   100 mg at 10/31/16 0757  . traZODone (DESYREL) tablet 50 mg  50 mg Oral QHS PRN Jenne Campus, MD   50 mg at 10/30/16 2118    Lab Results:  Results for orders placed or performed during the hospital encounter of 10/24/16 (from the past 48 hour(s))  Hepatic function panel     Status: Abnormal   Collection Time: 10/30/16  6:32 AM  Result Value Ref Range   Total Protein 7.1 6.5 - 8.1 g/dL   Albumin 3.9 3.5 - 5.0 g/dL   AST 25 15 - 41 U/L   ALT 38 14 - 54 U/L   Alkaline Phosphatase 70 38 - 126 U/L   Total Bilirubin 0.4 0.3 - 1.2 mg/dL   Bilirubin, Direct <0.1 (L) 0.1 - 0.5 mg/dL   Indirect Bilirubin NOT CALCULATED 0.3 - 0.9 mg/dL    Comment: Performed at [facility], [place] [address], [place], [place] [address]    Blood Alcohol level:  Lab Results  Component Value Date   ETH 208 (H) 10/24/2016   [facility]  12/25/2008    <5         LOWEST DETECTABLE LIMIT FOR SERUM ALCOHOL IS 5 mg/dL FOR MEDICAL PURPOSES ONLY    Metabolic Disorder Labs: No results found for: HGBA1C, MPG No results found for: PROLACTIN Lab Results  Component Value Date   CHOL 191 09/12/2011   TRIG 64 09/12/2011   HDL 66 09/12/2011   CHOLHDL 2.9 09/12/2011   VLDL 13 09/12/2011   LDLCALC 112 (H) 09/12/2011   LDLCALC  12/26/2008    71  Total Cholesterol/HDL:CHD Risk Coronary Heart Disease Risk Table                     Men   Women  1/2 Average Risk   3.4   3.3  Average Risk       5.0   4.4  2 X Average Risk   9.6   7.1  3 X Average Risk  23.4   11.0        Use the calculated Patient Ratio above and the CHD Risk Table to determine the patient's CHD Risk.        ATP III CLASSIFICATION (LDL):  <100     mg/dL   Optimal  100-129  mg/dL   Near or Above                    Optimal  130-159  mg/dL   Borderline  160-189  mg/dL   High  >190     mg/dL   Very High    Physical Findings: AIMS: Facial and Oral Movements Muscles of Facial Expression: None, normal Lips and Perioral Area: None, normal Jaw: None, normal Tongue: None, normal,Extremity Movements Upper (arms, wrists, hands, fingers): None, normal Lower (legs, knees, ankles, toes): None, normal, Trunk Movements Neck, shoulders, hips: None, normal, Overall Severity Severity of abnormal movements (highest score from questions above): None, normal Incapacitation due to abnormal movements: None, normal Patient's awareness of abnormal movements (rate only patient's report): No Awareness, Dental Status Current problems with teeth and/or dentures?: No Does patient usually wear dentures?: No  CIWA:  CIWA-Ar Total: 0 COWS:     Musculoskeletal: Strength & Muscle Tone: within normal limits- no tremors, no diaphoresis, no acute distress or restlessness  Gait & Station: normal  Patient leans: N/A  Psychiatric Specialty Exam: Physical Exam  Vitals reviewed. Constitutional: She is  oriented to person, place, and time.  Cardiovascular: Normal rate.   Neurological: She is alert and oriented to person, place, and time.  Psychiatric: She has a normal mood and affect. Her behavior is normal.    Review of Systems  Psychiatric/Behavioral: Positive for depression and hallucinations.  denies chest pain or shortness of breath  Blood pressure 116/77, pulse 86, temperature 97.8 F (36.6 C), temperature source Oral, resp. rate 20, height 5' 1"  (1.549 m), weight 68.7 kg (151 lb 8 oz), SpO2 97 %.Body mass index is 28.63 kg/m.  General Appearance: improving grooming   Eye Contact:  Good  Speech:  Normal Rate  Volume:  improving , less soft, patient more communicative, verbal than on admission  Mood:  mood improving , less depressed   Affect:  becoming more reactive, some anxiety  Thought Process:  Goal Directed and Descriptions of Associations: Intact  Orientation:  Other:  fully alert and attentive   Thought Content:  today does not report or endorse auditory hallucinations, and does not appear internally preoccupied   Suicidal Thoughts:  No currently denies any suicidal plan or intention and contracts for safety on the unit   Homicidal Thoughts:  No  Memory: recent and remote grossly intact    Judgement:  Other:  improving  Insight:  improving   Psychomotor Activity:  Normal-no significant distal tremors, no restlessness, no diaphoresis  Concentration:  Concentration: Fair and Attention Span: Fair  Recall:  [employer] of Knowledge:  Good  Language:  Good  Akathisia:  No  Handed:  Right  AIMS (if indicated):     Assets:  Desire for Improvement Resilience  ADL's:  Intact  Cognition:  WNL  Sleep:  Number of Hours: 5.75    Assessment - patient is presenting with improving mood and range of affect. She does reports some ongoing anxiety, particularly regarding frequent cravings for both cocaine and alcohol. She is focused on getting into a rehab program at discharge , and  fears that returning to community at this time would likely result in relapse . Denies medication side effects. Agreeing to Campral trial to address alcohol cravings- side effects discussed .   Treatment Plan Summary: Daily contact with patient to assess and evaluate symptoms and progress in treatment and Medication management   Continue treatment plan except where noted on 10/31/16  Encourage group and milieu participation to work on coping skills and symptom reduction Continue to encourage efforts to work on sobriety and relapse prevention  Treatment team working on disposition planning- patient interested in going to rehab after discharge  Continue  Risperidone  2 mg QHS for ongoing  psychotic symptoms Continue Celexa 20 mg QDAY  for depression Start Campral 666 mgrs TID for alcohol cravings Trazodone 50 mgrs QHS PRN for insomnia if needed  Continue CIWA/ Ativan  Protocol to minimize risk of alcohol WDL symptoms    Jenne Campus, MD 10/31/2016, 4:35 PM   Patient ID: Harmon Dun, female   DOB: [birth year], 56 y.o.   MRN: [id number]

## 2016-10-31 NOTE — Progress Notes (Signed)
Recreation Therapy Notes  Date: 10/31/16 Time: 0930 Location: [address] Dayroom  Group Topic: Stress Management  Goal Area(s) Addresses:  Patient will verbalize importance of using healthy stress management.  Patient will identify positive emotions associated with healthy stress management.   Behavioral Response: Engaged  Intervention: Stress Management  Activity :  Guided Imagery.  LRT introduced the stress management technique of guided imagery.  LRT read a script to allow patients engage in the technique.  Patients were to follow along as the script was read by LRT to participate in the technique of guided imagery.  Education:  Stress Management, Discharge Planning.   Education Outcome: Acknowledges edcuation/In group clarification offered/Needs additional education  Clinical Observations/Feedback: Pt attended group.    Victorino Sparrow, LRT/CTRS         Ria Comment, Gianina Olinde A 10/31/2016 11:20 AM

## 2016-10-31 NOTE — Progress Notes (Signed)
Pt has been in the dayroom all evening watching TV.  She reports that she had a good day.  She states she has been going to groups and feels the medications are helping with her anxiety.  She is hopeful that the social worker will find her a place for rehab soon.  She also said that the doctor was going to start her on some medicine to help her not crave alcohol so much.  Her main concern is sleep which she says is not good.  Pt denies SI/HI/AVH.  She has been pleasant and cooperative on the unit.  Writer spoke with the provider about pt's concern for better sleep and received order for increasing her trazodone.  Pt is in agreement with this plan.  Support and encouragement offered.  Discharge plans are in process.  Pt wants to go for rehab and referrals have been made.  Safety maintained with q15 minute checks.

## 2016-11-01 NOTE — BHH Group Notes (Signed)
[place] LCSW Group Therapy 11/01/2016 1:15 PM  Type of Therapy: Group Therapy- Feelings about Diagnosis  Participation Level: Pt was present for the duration of the group. Pt did not continue much to the discussion but did listen attentively.    Georga Kaufmann, MSW, [place]

## 2016-11-01 NOTE — Progress Notes (Signed)
D:  Patient's self inventory sheet, patient has fair sleep, sleep medication given.  Fair appetite, normal energy level, poor concentration.  Rated depression 2, hopeless 4, anxiety 1.  Withdrawals, cravings.  SI, sometimes, contracts for safety.  Denied physical problems.  Denied pain.  Goal is stay clean.  Plans to make meetings.  No discharge plans. A:  Medications administered per MD orders.  Emotional support and encouragement given patient. R:  Denied SI and HI while talking to nurse this morning, contracts for safety.  Wrote SI on self inventory form.  Denied A/V hallucinations.  Safety maintained with 15 minute checks. Patient interested in rehab after discharge, [place] [employer] being discussed.

## 2016-11-01 NOTE — Progress Notes (Signed)
Adult Psychoeducational Group Note  Date:  11/01/2016 Time:  4:42 AM  Group Topic/Focus:  Wrap-Up Group:   The focus of this group is to help patients review their daily goal of treatment and discuss progress on daily workbooks.  Participation Level:  Active  Participation Quality:  Appropriate and Attentive  Affect:  Appropriate  Cognitive:  Alert and Appropriate  Insight: Appropriate  Engagement in Group:  Engaged  Modes of Intervention:  Confrontation and Discussion  Additional Comments:  Patient stated her goal for today was to attend all groups. Patient stated she achieved her goal. Patient stated the medications was helping with her anxiety but was nervous about starting her new medication. Patient rated her day a 8 out of 10. Patient stated her goal for tomorrow was to see how the new medication affect her.  Candy Sledge 11/01/2016, 4:42 AM

## 2016-11-01 NOTE — Plan of Care (Signed)
Problem: Education: Goal: Utilization of techniques to improve thought processes will improve Outcome: Progressing Nurse discussed depression/coping skills with pateint.

## 2016-11-01 NOTE — Progress Notes (Signed)
[facility] MD Progress Note  11/01/2016 4:50 PM [name]  MRN:  [id number] Subjective: she reports she is doing " all right ". Reports her only complaint is having increased vivid dreams, nightmares, which could be related to Trazodone. She does not want to stop this medication , however, because she states she is sleeping better. She remains motivated in going to a rehab at discharge, and remains apprehensive about going back to community, because of poor sober support network and easy availability of drugs . Denies SI. Denies medication side effects, other than as above .  Objective:  I have discussed case with treatment team and have met with patient. Presents with improving mood and range of affect, denies SI. Of note, states that auditory hallucinations have improved, resolved at this time. No current psychotic symptoms. She is feeling better than on admission and focusing on discharge plans - apprehensive and reluctant to discharge back to outpatient management due to fear of relapse. Wants to go to rehab. Thus far tolerating medications well . Visible in day room, going to groups, behavior on unit in good control. .   Principal Problem: Alcohol-induced mood disorder with depressive symptoms ([place]) Diagnosis:   Patient Active Problem List   Diagnosis Date Noted  . Depression [F32.9] 10/26/2016  . Alcohol-induced mood disorder with depressive symptoms ([place]) [F10.94] 10/26/2016  . Alcohol-induced mood disorder ([place]) [F10.94] 10/24/2016  . Poor dentition [K08.9] 06/26/2016  . Essential hypertension [I10] 06/26/2016  . Hyperlipidemia [E78.5] 06/26/2016  . Elevated transaminase level [R74.0] 08/29/2015  . Health care maintenance [Z00.00] 12/31/2013  . Polysubstance abuse (h/o cocaine, ETOH, and tobacco abuse)  [F19.10]   . Primary vulvar squamous cell carcinoma s/p resection 2011 [C51.9] 02/13/2010  . 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 TOBACCO ABUSE [F17.200] 04/12/2006    Total Time spent with patient: 20 minutes  Past Psychiatric History:   Past Medical History:  Past Medical History:  Diagnosis Date  . Cocaine abuse   . ETOH abuse   . Hypothyroidism   . Unspecified mood (affective) disorder [facility])     Past Surgical History:  Procedure Laterality Date  . radioactive iodine ablation     graves disease s/p   Family History: History reviewed. No pertinent family history. Family Psychiatric  History:  Social History:  History  Alcohol Use No     History  Drug Use No    Comment: crack    Social History   Social History  . Marital status: Single    Spouse name: N/A  . Number of children: N/A  . Years of education: N/A   Social History Main Topics  . Smoking status: Current Every Day Smoker    Packs/day: 0.30    Types: Cigarettes  . Smokeless tobacco: Never Used  . Alcohol use No  . Drug use: No     Comment: crack  . Sexual activity: Yes    Partners: Male    Birth control/ protection: None   Other Topics Concern  . None   Social History Narrative  . None   Additional Social History:    Pain Medications: Pt denies Prescriptions: Hydroxyine per spelling on the intake sheet, Pamoate, Olanzapine, and Levothyroxine Over the Counter: Pt denies History of alcohol / drug use?: Yes Longest period of sobriety (when/how long): Pt sts she has never been without either Negative Consequences of Use: Financial Name of Substance 1: Alcohol 1 - Age of First Use: 17 1 - Amount (size/oz): 3-4 40 oz per  day 1 - Frequency: daily 1 - Duration: 39 years 1 - Last Use / Amount: 10/23/16 Name of Substance 2: Crack 2 - Age of First Use: 41 pt could not say for sure 2 - Amount (size/oz): $40 per day 2 - Frequency: daily 2 - Duration: 10 + years 2 - Last Use / Amount: 10/27/16  Sleep: better than on admission, reports some nightmares   Appetite:  Good  Current Medications: Current Facility-Administered Medications  Medication Dose Route  Frequency Provider Last Rate Last Dose  . acamprosate (CAMPRAL) tablet 666 mg  666 mg Oral TID WC Jenne Campus, MD   666 mg at 11/01/16 1138  . acetaminophen (TYLENOL) tablet 650 mg  650 mg Oral Q6H PRN Ethelene Hal, NP      . alum & mag hydroxide-simeth (MAALOX/MYLANTA) 200-200-20 MG/5ML suspension 30 mL  30 mL Oral Q4H PRN Ethelene Hal, NP      . citalopram (CELEXA) tablet 20 mg  20 mg Oral Daily Ethelene Hal, NP   20 mg at 11/01/16 0815  . hydrOXYzine (ATARAX/VISTARIL) tablet 25 mg  25 mg Oral Q6H PRN Ethelene Hal, NP   25 mg at 10/30/16 2319  . levothyroxine (SYNTHROID, LEVOTHROID) tablet 75 mcg  75 mcg Oral Q0600 Jenne Campus, MD   75 mcg at 11/01/16 3832  . magnesium hydroxide (MILK OF MAGNESIA) suspension 30 mL  30 mL Oral Daily PRN Ethelene Hal, NP      . multivitamin with minerals tablet 1 tablet  1 tablet Oral Daily Laverle Hobby, PA-C   1 tablet at 11/01/16 0816  . nicotine (NICODERM CQ - dosed in mg/24 hours) patch 21 mg  21 mg Transdermal Daily Jenne Campus, MD   21 mg at 11/01/16 0816  . risperiDONE (RISPERDAL) tablet 2 mg  2 mg Oral QHS Jenne Campus, MD   2 mg at 10/31/16 2108  . thiamine (VITAMIN B-1) tablet 100 mg  100 mg Oral Daily Laverle Hobby, PA-C   100 mg at 11/01/16 9191  . traZODone (DESYREL) tablet 100 mg  100 mg Oral QHS PRN,MR X 1 Laverle Hobby, PA-C   100 mg at 10/31/16 2228    Lab Results:  No results found for this or any previous visit (from the past 48 hour(s)).  Blood Alcohol level:  Lab Results  Component Value Date   ETH 208 (H) 10/24/2016   ETH  12/25/2008    <5        LOWEST DETECTABLE LIMIT FOR SERUM ALCOHOL IS 5 mg/dL FOR MEDICAL PURPOSES ONLY    Metabolic Disorder Labs: No results found for: HGBA1C, MPG No results found for: PROLACTIN Lab Results  Component Value Date   CHOL 191 09/12/2011   TRIG 64 09/12/2011   HDL 66 09/12/2011   CHOLHDL 2.9 09/12/2011   VLDL 13 09/12/2011    LDLCALC 112 (H) 09/12/2011   LDLCALC  12/26/2008    71        Total Cholesterol/HDL:CHD Risk Coronary Heart Disease Risk Table                     Men   Women  1/2 Average Risk   3.4   3.3  Average Risk       5.0   4.4  2 X Average Risk   9.6   7.1  3 X Average Risk  23.4   11.0  Use the calculated Patient Ratio above and the CHD Risk Table to determine the patient's CHD Risk.        ATP III CLASSIFICATION (LDL):  <100     mg/dL   Optimal  100-129  mg/dL   Near or Above                    Optimal  130-159  mg/dL   Borderline  160-189  mg/dL   High  >190     mg/dL   Very High    Physical Findings: AIMS: Facial and Oral Movements Muscles of Facial Expression: None, normal Lips and Perioral Area: None, normal Jaw: None, normal Tongue: None, normal,Extremity Movements Upper (arms, wrists, hands, fingers): None, normal Lower (legs, knees, ankles, toes): None, normal, Trunk Movements Neck, shoulders, hips: None, normal, Overall Severity Severity of abnormal movements (highest score from questions above): None, normal Incapacitation due to abnormal movements: None, normal Patient's awareness of abnormal movements (rate only patient's report): No Awareness, Dental Status Current problems with teeth and/or dentures?: No Does patient usually wear dentures?: No  CIWA:  CIWA-Ar Total: 1 COWS:  COWS Total Score: 2  Musculoskeletal: Strength & Muscle Tone: within normal limits- no current tremors or motor restlessness  Gait & Station: normal  Patient leans: N/A  Psychiatric Specialty Exam: Physical Exam  Vitals reviewed. Constitutional: She is oriented to person, place, and time.  Cardiovascular: Normal rate.   Neurological: She is alert and oriented to person, place, and time.  Psychiatric: She has a normal mood and affect. Her behavior is normal.    Review of Systems  Psychiatric/Behavioral: Positive for depression and hallucinations.  denies chest pain or shortness  of breath  Blood pressure 128/72, pulse 93, temperature 97.7 F (36.5 C), temperature source Oral, resp. rate 16, height 5' 1"  (1.549 m), weight 68.7 kg (151 lb 8 oz), SpO2 97 %.Body mass index is 28.63 kg/m.  General Appearance: improving grooming   Eye Contact:  Good  Speech:  Normal Rate  Volume:  Normal  Mood:  improved mood and range of affect   Affect:  fuller in range   Thought Process:  Goal Directed and Descriptions of Associations: Intact  Orientation:  Full (Time, Place, and Person)  Thought Content:  reports auditory hallucinations have resolved, does not appear internally preoccupied and no delusions expressed   Suicidal Thoughts:  No currently denies any suicidal plan or intention and contracts for safety on the unit   Homicidal Thoughts:  No  Memory: recent and remote grossly intact    Judgement:  Other:  improving  Insight:  improving   Psychomotor Activity:  Normal-no significant distal tremors, no restlessness, no diaphoresis  Concentration:  Concentration: Good and Attention Span: Good  Recall:  Good  Fund of Knowledge:  Good  Language:  Good  Akathisia:  No  Handed:  Right  AIMS (if indicated):     Assets:  Desire for Improvement Resilience  ADL's:  Intact  Cognition:  WNL  Sleep:  Number of Hours: 6.75    Assessment - patient continues to improve and presents with improved mood and fuller range of affect. Psychotic symptoms have resolved. She is future oriented. Her major focus is to proceed with plan of going to a rehab as she is apprehensive she may likely relapse otherwise . Thus far tolerating medications well .  Treatment Plan Summary: Daily contact with patient to assess and evaluate symptoms and progress in treatment and Medication management  Continue treatment plan except where noted on 11/01/16  Encourage group and milieu participation to work on coping skills and symptom reduction Continue to encourage efforts to work on sobriety and relapse  prevention  Treatment team working on disposition planning- see above  Continue  Risperidone  2 mg QHS for ongoing  psychotic symptoms Continue Celexa 20 mg QDAY  for depression Continue Campral 666 mgrs TID for alcohol cravings Trazodone 50 mgrs QHS PRN for insomnia if needed      Jenne Campus, MD 11/01/2016, 4:50 PM   Patient ID: [name], female   DOB: Dec 22, 1960, 56 y.o.   MRN: [id number]

## 2016-11-01 NOTE — Plan of Care (Signed)
Problem: Health Behavior/Discharge Planning: Goal: Compliance with treatment plan for underlying cause of condition will improve Outcome: Progressing Patient taking and requested medications as prescribed. Medication regiment reviewed with patient; patient verbalized understanding without further questions.

## 2016-11-01 NOTE — [facility] Group Notes (Signed)
The focus of this group is to educate the patient on the purpose and policies of crisis stabilization and provide a format to answer questions about their admission.  The group details unit policies and expectations of patients while admitted.  Patient did not attend 0900 nurse education orientation group this morning.  Patient stayed in bed.   

## 2016-11-01 NOTE — Progress Notes (Signed)
Nursing Progress Note [id number]  D) Patient presents pleasant but anxious. Patient is assertive with her needs and requesting medications. Patient reports readiness for further substance abuse treatment. Patient is seen interactive in the milieu. Patient denies SI/HI/AVH or pain. Patient contracts for safety on the unit. Patient did not attend karaoke.  A) Emotional support given. 1:1 interaction and active listening provided. Patient medicated with PM orders as prescribed. Medications reviewed with patient. Patient verbalized understanding of medications without further questions.  Snacks and fluids provided. Opportunities for questions or concerns presented to patient. Patient encouraged to continue to work on treatment goals. Labs, vital signs and patient behavior monitored throughout shift. Patient safety maintained with q15 min safety checks.  R) Patient receptive to interaction with nurse. Patient remains safe on the unit at this time. Patient denies any adverse medication reactions at this time. Patient is resting in bed without complaints. Will continue to monitor.

## 2016-11-02 NOTE — Progress Notes (Signed)
D: Patient's self inventory sheet: patient has fair sleep, recieved sleep medication.fair  Appetite, normal energy level, poor concentration. Rated depression 9/10, hopeless 8/10, anxiety 1/10. SI/HI/AVH: Continues to endorse intermittent SI, denies AVH and HI. Physical complaints are denied. Goal is "stay clean". Plans to work on "going meet".   A: Medications administered, assessed medication knowledge and education given on medication regimen.  Emotional support and encouragement given patient. R: Continues with intermittent  SI and denies HI , contracts for safety. Safety maintained with 15 minute checks.

## 2016-11-02 NOTE — [facility] Group Notes (Signed)
[facility] LCSW Group Therapy 11/02/2016 1:15pm  Type of Therapy: Group Therapy- Feelings Around Relapse and Recovery  Participation Level: Pt was present for the duration of the group. Pt did not participate in the discussion but was attentive throughout.   Georga Kaufmann, MSW, [place] 11/02/2016 4:18 PM

## 2016-11-02 NOTE — Progress Notes (Signed)
[facility] MD Progress Note  11/02/2016 2:05 PM [name]  MRN:  [id number] Subjective:  She states that her family does not feel that they can take her back due to her substance.  She remains motivated in going to a rehab at discharge, and remains apprehensive about going back to community, because of poor sober support network and easy availability of drugs . Denies SI. Denies medication side effects, other than as above .  Objective:  I have discussed case with treatment team and have met with patient.  Denies SI today and no psychosis.  She states that she would like to defer on an early and premature discharge until she is ready.  She would also like to enter into a long term detox program like [facility] or [facility].   Patient states that auditory hallucinations have improved, resolved at this time. Thus far tolerating medications well . Visible in day room, going to groups, behavior on unit in good control. .   Principal Problem: Alcohol-induced mood disorder with depressive symptoms ([place]) Diagnosis:   Patient Active Problem List   Diagnosis Date Noted  . Depression [F32.9] 10/26/2016  . Alcohol-induced mood disorder with depressive symptoms ([place]) [F10.94] 10/26/2016  . Alcohol-induced mood disorder ([place]) [F10.94] 10/24/2016  . Poor dentition [K08.9] 06/26/2016  . Essential hypertension [I10] 06/26/2016  . Hyperlipidemia [E78.5] 06/26/2016  . Elevated transaminase level [R74.0] 08/29/2015  . Health care maintenance [Z00.00] 12/31/2013  . Polysubstance abuse (h/o cocaine, ETOH, and tobacco abuse)  [F19.10]   . Primary vulvar squamous cell carcinoma s/p resection 2011 [C51.9] 02/13/2010  . 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 TOBACCO ABUSE [F17.200] 04/12/2006   Total Time spent with patient: 20 minutes  Past Psychiatric History:   Past Medical History:  Past Medical History:  Diagnosis Date  . Cocaine abuse   . ETOH abuse   . Hypothyroidism   . Unspecified mood  (affective) disorder [facility])     Past Surgical History:  Procedure Laterality Date  . radioactive iodine ablation     graves disease s/p   Family History: History reviewed. No pertinent family history. Family Psychiatric  History:  Social History:  History  Alcohol Use No     History  Drug Use No    Comment: crack    Social History   Social History  . Marital status: Single    Spouse name: N/A  . Number of children: N/A  . Years of education: N/A   Social History Main Topics  . Smoking status: Current Every Day Smoker    Packs/day: 0.30    Types: Cigarettes  . Smokeless tobacco: Never Used  . Alcohol use No  . Drug use: No     Comment: crack  . Sexual activity: Yes    Partners: Male    Birth control/ protection: None   Other Topics Concern  . None   Social History Narrative  . None   Additional Social History:    Pain Medications: Pt denies Prescriptions: Hydroxyine per spelling on the intake sheet, Pamoate, Olanzapine, and Levothyroxine Over the Counter: Pt denies History of alcohol / drug use?: Yes Longest period of sobriety (when/how long): Pt sts she has never been without either Negative Consequences of Use: Financial Name of Substance 1: Alcohol 1 - Age of First Use: 17 1 - Amount (size/oz): 3-4 40 oz per day 1 - Frequency: daily 1 - Duration: 39 years 1 - Last Use / Amount: 10/23/16 Name of Substance 2: Crack 2 -  Age of First Use: 41 pt could not say for sure 2 - Amount (size/oz): $40 per day 2 - Frequency: daily 2 - Duration: 10 + years 2 - Last Use / Amount: 10/27/16  Sleep: better than on admission, reports some nightmares   Appetite:  Good  Current Medications: Current Facility-Administered Medications  Medication Dose Route Frequency Provider Last Rate Last Dose  . acamprosate (CAMPRAL) tablet 666 mg  666 mg Oral TID WC Jenne Campus, MD   666 mg at 11/02/16 1134  . acetaminophen (TYLENOL) tablet 650 mg  650 mg Oral Q6H PRN Ethelene Hal, NP      . alum & mag hydroxide-simeth (MAALOX/MYLANTA) 200-200-20 MG/5ML suspension 30 mL  30 mL Oral Q4H PRN Ethelene Hal, NP      . citalopram (CELEXA) tablet 20 mg  20 mg Oral Daily Ethelene Hal, NP   20 mg at 11/02/16 5009  . hydrOXYzine (ATARAX/VISTARIL) tablet 25 mg  25 mg Oral Q6H PRN Ethelene Hal, NP   25 mg at 10/30/16 2319  . levothyroxine (SYNTHROID, LEVOTHROID) tablet 75 mcg  75 mcg Oral Q0600 Jenne Campus, MD   75 mcg at 11/02/16 0612  . magnesium hydroxide (MILK OF MAGNESIA) suspension 30 mL  30 mL Oral Daily PRN Ethelene Hal, NP      . multivitamin with minerals tablet 1 tablet  1 tablet Oral Daily Laverle Hobby, PA-C   1 tablet at 11/02/16 3818  . nicotine (NICODERM CQ - dosed in mg/24 hours) patch 21 mg  21 mg Transdermal Daily Jenne Campus, MD   21 mg at 11/02/16 2993  . risperiDONE (RISPERDAL) tablet 2 mg  2 mg Oral QHS Jenne Campus, MD   2 mg at 11/01/16 2118  . thiamine (VITAMIN B-1) tablet 100 mg  100 mg Oral Daily Laverle Hobby, PA-C   100 mg at 11/02/16 7169  . traZODone (DESYREL) tablet 100 mg  100 mg Oral QHS PRN,MR X 1 Laverle Hobby, PA-C   100 mg at 11/01/16 2119    Lab Results:  No results found for this or any previous visit (from the past 48 hour(s)).  Blood Alcohol level:  Lab Results  Component Value Date   ETH 208 (H) 10/24/2016   ETH  12/25/2008    <5        LOWEST DETECTABLE LIMIT FOR SERUM ALCOHOL IS 5 mg/dL FOR MEDICAL PURPOSES ONLY    Metabolic Disorder Labs: No results found for: HGBA1C, MPG No results found for: PROLACTIN Lab Results  Component Value Date   CHOL 191 09/12/2011   TRIG 64 09/12/2011   HDL 66 09/12/2011   CHOLHDL 2.9 09/12/2011   VLDL 13 09/12/2011   LDLCALC 112 (H) 09/12/2011   LDLCALC  12/26/2008    71        Total Cholesterol/HDL:CHD Risk Coronary Heart Disease Risk Table                     Men   Women  1/2 Average Risk   3.4   3.3  Average Risk        5.0   4.4  2 X Average Risk   9.6   7.1  3 X Average Risk  23.4   11.0        Use the calculated Patient Ratio above and the CHD Risk Table to determine the patient's CHD Risk.  ATP III CLASSIFICATION (LDL):  <100     mg/dL   Optimal  100-129  mg/dL   Near or Above                    Optimal  130-159  mg/dL   Borderline  160-189  mg/dL   High  >190     mg/dL   Very High    Physical Findings: AIMS: Facial and Oral Movements Muscles of Facial Expression: None, normal Lips and Perioral Area: None, normal Jaw: None, normal Tongue: None, normal,Extremity Movements Upper (arms, wrists, hands, fingers): None, normal Lower (legs, knees, ankles, toes): None, normal, Trunk Movements Neck, shoulders, hips: None, normal, Overall Severity Severity of abnormal movements (highest score from questions above): None, normal Incapacitation due to abnormal movements: None, normal Patient's awareness of abnormal movements (rate only patient's report): No Awareness, Dental Status Current problems with teeth and/or dentures?: No Does patient usually wear dentures?: No  CIWA:  CIWA-Ar Total: 1 COWS:  COWS Total Score: 2  Musculoskeletal: Strength & Muscle Tone: within normal limits- no current tremors or motor restlessness  Gait & Station: normal  Patient leans: N/A  Psychiatric Specialty Exam: Physical Exam  Vitals reviewed. Constitutional: She is oriented to person, place, and time.  Cardiovascular: Normal rate.   Neurological: She is alert and oriented to person, place, and time.  Psychiatric: She has a normal mood and affect. Her behavior is normal. Thought content is not paranoid. She expresses no homicidal and no suicidal ideation.    Review of Systems  Psychiatric/Behavioral: Positive for depression and hallucinations.  denies chest pain or shortness of breath  Blood pressure 111/69, pulse 100, temperature 97.3 F (36.3 C), resp. rate 16, height 5' 1"  (1.549 m), weight 68.7 kg  (151 lb 8 oz), SpO2 97 %.Body mass index is 28.63 kg/m.  General Appearance: improving grooming   Eye Contact:  Good  Speech:  Normal Rate  Volume:  Normal  Mood:  improved mood and range of affect   Affect:  fuller in range   Thought Process:  Goal Directed and Descriptions of Associations: Intact  Orientation:  Full (Time, Place, and Person)  Thought Content:  reports auditory hallucinations have resolved, does not appear internally preoccupied and no delusions expressed   Suicidal Thoughts:  No currently denies any suicidal plan or intention and contracts for safety on the unit   Homicidal Thoughts:  No  Memory: recent and remote grossly intact    Judgement:  Other:  improving  Insight:  improving   Psychomotor Activity:  Normal-no significant distal tremors, no restlessness, no diaphoresis  Concentration:  Concentration: Good and Attention Span: Good  Recall:  Good  Fund of Knowledge:  Good  Language:  Good  Akathisia:  No  Handed:  Right  AIMS (if indicated):     Assets:  Desire for Improvement Resilience  ADL's:  Intact  Cognition:  WNL  Sleep:  Number of Hours: 5.75    Assessment - patient continues to improve and presents with improved mood and fuller range of affect. Psychotic symptoms have resolved. She is future oriented. Her major focus is to proceed with plan of going to a rehab as she is apprehensive she may likely relapse otherwise . Thus far tolerating medications well .  Treatment Plan Summary:  Reviewed 11/02/2016 Daily contact with patient to assess and evaluate symptoms and progress in treatment and Medication management  Encourage group and milieu participation to work on coping skills and  symptom reduction Continue to encourage efforts to work on sobriety and relapse prevention  Treatment team working on disposition planning- see above  Continue  Risperidone  2 mg QHS for ongoing  psychotic symptoms Continue Celexa 20 mg QDAY  for depression Continue  Campral 666 mgrs TID for alcohol cravings Trazodone 50 mgrs QHS PRN for insomnia if needed   Janett Labella, NP  [facility] 11/02/2016, 2:05 PM

## 2016-11-02 NOTE — Progress Notes (Signed)
Adult Psychoeducational Group Note  Date:  11/02/2016 Time:  11:15 PM  Group Topic/Focus:  Wrap-Up Group:   The focus of this group is to help patients review their daily goal of treatment and discuss progress on daily workbooks.  Participation Level:  Active  Participation Quality:  Appropriate  Affect:  Appropriate  Cognitive:  Appropriate  Insight: Appropriate  Engagement in Group:  Engaged  Modes of Intervention:  Discussion  Additional Comments:  Patient attended group and said that her day was a 5. Her coping skill was relaxing and listening to music on  [place].   [place] 0/11/8401, 11:15 PM

## 2016-11-02 NOTE — Tx Team (Signed)
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 acamprosate (CAMPRAL) tablet 666 mg  666 mg Oral TID WC Jenne Campus, MD   666 mg at 11/02/16 1134  . acetaminophen (TYLENOL) tablet 650 mg  650 mg Oral Q6H PRN Ethelene Hal, NP      . alum & mag hydroxide-simeth (MAALOX/MYLANTA) 200-200-20 MG/5ML suspension 30 mL  30 mL Oral Q4H PRN Ethelene Hal, NP      . citalopram (CELEXA) tablet 20 mg  20 mg Oral Daily Ethelene Hal, NP   20 mg at 11/02/16 6063  . hydrOXYzine (ATARAX/VISTARIL) tablet 25 mg  25 mg Oral Q6H PRN Ethelene Hal, NP   25 mg at 10/30/16 2319  . levothyroxine (SYNTHROID, LEVOTHROID) tablet 75 mcg  75 mcg Oral Q0600 Jenne Campus, MD   75 mcg at 11/02/16 0612  . magnesium hydroxide (MILK OF MAGNESIA) suspension 30 mL  30 mL Oral Daily PRN Ethelene Hal, NP      . multivitamin with minerals tablet 1 tablet  1 tablet Oral Daily Laverle Hobby, PA-C   1 tablet at 11/02/16 0160  . nicotine (NICODERM CQ - dosed in mg/24 hours) patch 21 mg  21 mg Transdermal Daily Jenne Campus, MD   21 mg at 11/02/16 1093  . risperiDONE (RISPERDAL) tablet 2 mg  2 mg Oral QHS Jenne Campus, MD   2 mg at 11/01/16 2118  . thiamine (VITAMIN B-1) tablet 100 mg  100 mg Oral Daily Laverle Hobby, PA-C   100 mg at 11/02/16 2355  . traZODone (DESYREL) tablet 100 mg  100 mg Oral QHS PRN,MR X 1 Laverle Hobby, PA-C   100 mg at 11/01/16 2119    PTA Medications: Prescriptions Prior to Admission   Medication Sig Dispense Refill Last Dose  . escitalopram (LEXAPRO) 20 MG tablet Take 20 mg by mouth daily.   10/22/2016  . hydrOXYzine (VISTARIL) 50 MG capsule Take 50 mg by mouth 3 (three) times daily as needed for anxiety.   10/22/2016  . levothyroxine (SYNTHROID, LEVOTHROID) 75 MCG tablet Take 1 tablet (75 mcg total) by mouth daily. 90 tablet 1 10/22/2016    Treatment Modalities: Medication Management, Group therapy, Case management,  1 to 1 session with clinician, Psychoeducation, Recreational therapy.  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[place]) Long Term Goal(s): Improvement in symptoms so as ready for discharge Short Term Goals: Ability to identify changes in lifestyle to reduce recurrence of condition will improve Ability to verbalize feelings will improve Ability to disclose and discuss suicidal ideas Ability to maintain clinical measurements within normal limits will improve Compliance with prescribed medications will improve Ability to identify triggers associated with substance abuse/mental health issues will improve  Medication Management: Evaluate patient's response, side effects, and tolerance of medication regimen.  Therapeutic Interventions: 1 to 1 sessions, Unit Group sessions and Medication administration.  Evaluation of Outcomes: Progressing  Physician Treatment Plan  for Secondary Diagnosis: Principal Problem:   Alcohol-induced mood disorder with depressive symptoms (HCC) Active Problems:   Alcohol-induced mood disorder ([place])   Depression  Long Term Goal(s): Improvement in symptoms so as ready for discharge  Short Term Goals: Ability to identify changes in lifestyle to reduce recurrence of  condition will improve Ability to verbalize feelings will improve Ability to disclose and discuss suicidal ideas Ability to maintain clinical measurements within normal limits will improve Compliance with prescribed medications will improve Ability to identify triggers associated with substance abuse/mental health issues will improve  Medication Management: Evaluate patient's response, side effects, and tolerance of medication regimen.  Therapeutic Interventions: 1 to 1 sessions, Unit Group sessions and Medication administration.  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 RN will report any adverse and/or side effects to prescribing provider.  Therapeutic Interventions: 1 on 1 counseling sessions, Psychoeducation, Medication administration, Evaluate responses to treatment, Monitor vital signs and CBGs as ordered, Perform/monitor CIWA, COWS, AIMS and Fall Risk screenings as ordered, Perform wound care treatments as ordered.  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 Short Term Goals: Engage patient in aftercare planning with referrals and resources, Facilitate patient progression through stages of change regarding substance use diagnoses and concerns,  Identify triggers associated with mental health/substance abuse issues and Increase skills for wellness and recovery  Therapeutic Interventions: Assess for all discharge needs, 1 to 1 time with Social worker, Explore available resources and support systems, Assess for adequacy in community support network, Educate family and significant other(s) on suicide prevention, Complete Psychosocial Assessment, Interpersonal group therapy.  Evaluation of Outcomes: Progressing  Progress in Treatment: Attending groups: Yes  Participating in groups: Yes Taking medication as prescribed: Yes, MD continues to assess for medication changes as needed Toleration medication: Yes, no side effects reported at this time Family/Significant other contact made: No, pt declined contact. Patient understands diagnosis: Developing insight  Discussing patient identified problems/goals with staff: Yes Medical problems stabilized or resolved: Yes Denies suicidal/homicidal ideation: Yes Issues/concerns per patient self-inventory: None Other: N/A  New problem(s) identified: None identified at this time.   New Short Term/Long Term Goal(s): None identified at this time.   Discharge Plan or Barriers: Pt will return home and follow-up inpatient with [facility] or [facility]. [facility] referral sent 10/30/16, message left for [facility] 10/30/16.  Reason for Continuation of Hospitalization:  Anxiety  Depression Medication stabilization Suicidal ideation Withdrawal symptoms  Estimated Length of Stay: 3-5 days  Attendees: Patient: 11/02/2016 4:16 PM  Physician: Dr. Parke Poisson 11/02/2016 4:16 PM  Nursing: Opal Sidles RN; [name], RN 11/02/2016 4:16 PM  RN Care Manager: Lars Pinks, RN 11/02/2016 4:16 PM  Social Worker: Matthew Saras, [place] 11/02/2016 4:16 PM  Recreational Therapist:  11/02/2016 4:16 PM  Other: Lindell Spar, NP; Samuel Jester, NP 11/02/2016 4:16 PM  Other:  11/02/2016 4:16 PM  Other: 11/02/2016 4:16 PM   Scribe for Treatment Team: Georga Kaufmann,  MSW,LCSWA 11/02/2016 4:16 PM

## 2016-11-02 NOTE — BHH Group Notes (Signed)
[facility] LCSW Aftercare Discharge Planning Group Note   11/02/2016  8:45 AM  Participation Quality:  Active   Mood/Affect:  Appropriate  Thoughts of Suicide:  No  Current AVH:  No  Plan for Discharge/Comments:  Pt is concerned about her upcoming discharge because she believes she will relapse unless she is released to another treatment facility. CSW assured pt that she is still on the waiting list for [facility] and that CSW can continue to make referrals to additional places as well. Other than anxiety about discharge, pt reports she is feeling well today.  Transportation Means: Pt has access to transporation  Supports: Pt did not identify any supports.  Georga Kaufmann, MSW, LCSWA 11/02/2016 10:31 AM

## 2016-11-02 NOTE — Progress Notes (Signed)
Patient ID: [name], female   DOB: December 27, 1960, 56 y.o.   MRN: [id number] D: Client visible on the unit, loud, but pleasant. Client reports "they helping me, they give me the pills so I want drink, but I still thing about drinking" "I'm looking forward to recovery, going to the [facility], or where ever they find for me" My family say I can't come back to them, I got to get some help, so I'm ready, I'm tired of this" Client reports depression "8" of 10. A: Writer provided emotional support encouraged client to continue medications, move forward in recovery. Medications reviewed, administered as ordered. Staff will monitor q80min for safety. R:Client is safe on the unit, attended group.

## 2016-11-02 NOTE — Progress Notes (Signed)
Recreation Therapy Notes  Date: 11/02/16 Time: 0930 Location: [address] Dayroom  Group Topic: Stress Management  Goal Area(s) Addresses:  Patient will verbalize importance of using healthy stress management.  Patient will identify positive emotions associated with healthy stress management.   Intervention: Stress Management  Activity :  Meditation.  LRT introduced the stress management technique of meditation.  LRT played a meditation on the importance of letting go of past events and be in the moment.  Patients were to follow along as the meditation played to fully engage in the technique.  Education:  Stress Management, Discharge Planning.   Education Outcome: Acknowledges edcuation/In group clarification offered/Needs additional education  Clinical Observations/Feedback: Pt did not attend group.    Victorino Sparrow, LRT/CTRS         Ria Comment, Arzell Mcgeehan A 11/02/2016 11:15 AM

## 2016-11-03 DIAGNOSIS — F332 Major depressive disorder, recurrent severe without psychotic features: Secondary | ICD-10-CM

## 2016-11-03 MED ORDER — TRAZODONE HCL 150 MG PO TABS
150.0000 mg | ORAL_TABLET | Freq: Every day | ORAL | Status: DC
  Administered 2016-11-03: 150 mg via ORAL
  Filled 2016-11-03 (×4): qty 1

## 2016-11-03 NOTE — BHH Group Notes (Signed)
Adult Therapy Group Note  Date:  11/03/2016  Time:  10:00-11:00AM  Group Topic/Focus: Unhealthy vs Healthy Coping Techniques  Building Self Esteem:    The focus of this group was to determine what unhealthy coping techniques typically are used or and what healthy coping techniques would be helpful in coping with various problems. Patients were guided in becoming aware of the differences between healthy and unhealthy coping techniques.  Vignettes were used to generate ideas, which led to a discussion about personal application.     Participation Level:  Active  Participation Quality:  Attentive  Affect:  Blunted  Cognitive:  Appropriate  Insight: Good  Engagement in Group:  Engaged  Modes of Intervention:  Exercise, Discussion and Support  Additional Comments:  The patient expressed that something unhealthy she does is drugs, and she could not think of any healthy coping techniques to learn.  She was called out to see a psychiatric provider, but while present was on topic, when returned was also on topic.  Selmer Dominion, LCSW 11/03/2016   3:55 PM

## 2016-11-03 NOTE — Plan of Care (Signed)
Problem: Health Behavior/Discharge Planning: Goal: Identification of resources available to assist in meeting health care needs will improve Outcome: Progressing Identification of resource available to assist in meeting health care needs will improve AEB client reports that she will be going to long term rehab when leaving this facility. "I'm looking forward to recovery" "my family said I couldn't come back with them so I got to go"

## 2016-11-03 NOTE — Progress Notes (Signed)
[facility] MD Progress Note  11/03/2016 9:40 AM [name]  MRN:  [id number] Subjective:  "I really want to make sure I go to a rehab facility. I want to get help this time and I'm ready to put in the effort."  Objective: Pt seen and chart reviewed. Pt is alert/oriented x4, calm, cooperative, and appropriate to situation. Pt denies suicidal/homicidal ideation and psychosis and does not appear to be responding to internal stimuli. Pt ruminates about going to a rehab facility; informed her that this would be determined Monday morning when facility acceptance coordinators were working along with our Mon-Fri SW team. Pt presents with good insight into her behavior as she feels she is self-medicating to treat her depression.     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 Depression [F32.9] 10/26/2016  . 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 Alcohol-induced mood disorder ([place]) [F10.94] 10/24/2016  . Poor dentition [K08.9] 06/26/2016  . Essential hypertension [I10] 06/26/2016  . Hyperlipidemia [E78.5] 06/26/2016  . Elevated transaminase level [R74.0] 08/29/2015  . Health care maintenance [Z00.00] 12/31/2013  . Polysubstance abuse (h/o cocaine, ETOH, and tobacco abuse)  [F19.10]   . Primary vulvar squamous cell carcinoma s/p resection 2011 [C51.9] 02/13/2010  . 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 TOBACCO ABUSE [F17.200] 04/12/2006   Total Time spent with patient: 30 minutes    Past Medical History:  Past Medical History:  Diagnosis Date  . Cocaine abuse   . ETOH abuse   . Hypothyroidism   . Unspecified mood (affective) disorder [facility])     Past Surgical History:  Procedure Laterality Date  . radioactive iodine ablation     graves disease s/p   Family History: History reviewed. No pertinent family history. Family Psychiatric   History:  Social History:  History  Alcohol Use No     History  Drug Use No    Comment: crack    Social History   Social History  . Marital status: Single    Spouse name: N/A  . Number of children: N/A  . Years of education: N/A   Social History Main Topics  . Smoking status: Current Every Day Smoker    Packs/day: 0.30    Types: Cigarettes  . Smokeless tobacco: Never Used  . Alcohol use No  . Drug use: No     Comment: crack  . Sexual activity: Yes    Partners: Male    Birth control/ protection: None   Other Topics Concern  . None   Social History Narrative  . None   Additional Social History:    Pain Medications: Pt denies Prescriptions: Hydroxyine per spelling on the intake sheet, Pamoate, Olanzapine, and Levothyroxine Over the Counter: Pt denies History of alcohol / drug use?: Yes Longest period of sobriety (when/how long): Pt sts she has never been without either Negative Consequences of Use: Financial Name of Substance 1: Alcohol 1 - Age of First Use: 17 1 - Amount (size/oz): 3-4 40 oz per day 1 - Frequency: daily 1 - Duration: 39 years 1 - Last Use / Amount: 10/23/16 Name of Substance 2: Crack 2 - Age of First Use: 41 pt could not say for sure 2 - Amount (size/oz): $40 per day 2 - Frequency: daily 2 - Duration: 10 + years 2 - Last Use / Amount: 10/27/16  Sleep: Fair  Appetite:  Good  Current Medications:  Current Facility-Administered Medications  Medication Dose Route Frequency Provider Last Rate Last Dose  . acamprosate (CAMPRAL) tablet 666 mg  666 mg Oral TID WC Cobos, Myer Peer, MD   666 mg at 11/03/16 6440  . acetaminophen (TYLENOL) tablet 650 mg  650 mg Oral Q6H PRN Ethelene Hal, NP      . alum & mag hydroxide-simeth (MAALOX/MYLANTA) 200-200-20 MG/5ML suspension 30 mL  30 mL Oral Q4H PRN Ethelene Hal, NP      . citalopram (CELEXA) tablet 20 mg  20 mg Oral Daily Ethelene Hal, NP   20 mg at 11/03/16 0754  .  hydrOXYzine (ATARAX/VISTARIL) tablet 25 mg  25 mg Oral Q6H PRN Ethelene Hal, NP   25 mg at 10/30/16 2319  . levothyroxine (SYNTHROID, LEVOTHROID) tablet 75 mcg  75 mcg Oral Q0600 Cobos, Myer Peer, MD   75 mcg at 11/03/16 0620  . magnesium hydroxide (MILK OF MAGNESIA) suspension 30 mL  30 mL Oral Daily PRN Ethelene Hal, NP      . multivitamin with minerals tablet 1 tablet  1 tablet Oral Daily Laverle Hobby, PA-C   1 tablet at 11/03/16 0754  . nicotine (NICODERM CQ - dosed in mg/24 hours) patch 21 mg  21 mg Transdermal Daily Cobos, Myer Peer, MD   21 mg at 11/03/16 0755  . risperiDONE (RISPERDAL) tablet 2 mg  2 mg Oral QHS Cobos, Myer Peer, MD   2 mg at 11/02/16 2119  . thiamine (VITAMIN B-1) tablet 100 mg  100 mg Oral Daily Patriciaann Clan E, PA-C   100 mg at 11/03/16 0754  . traZODone (DESYREL) tablet 100 mg  100 mg Oral QHS PRN,MR X 1 Laverle Hobby, PA-C   100 mg at 11/02/16 2119    Lab Results:  No results found for this or any previous visit (from the past 48 hour(s)).  Blood Alcohol level:  Lab Results  Component Value Date   ETH 208 (H) 10/24/2016   ETH  12/25/2008    <5        LOWEST DETECTABLE LIMIT FOR SERUM ALCOHOL IS 5 mg/dL FOR MEDICAL PURPOSES ONLY    Metabolic Disorder Labs: No results found for: HGBA1C, MPG No results found for: PROLACTIN Lab Results  Component Value Date   CHOL 191 09/12/2011   TRIG 64 09/12/2011   HDL 66 09/12/2011   CHOLHDL 2.9 09/12/2011   VLDL 13 09/12/2011   LDLCALC 112 (H) 09/12/2011   LDLCALC  12/26/2008    71        Total Cholesterol/HDL:CHD Risk Coronary Heart Disease Risk Table                     Men   Women  1/2 Average Risk   3.4   3.3  Average Risk       5.0   4.4  2 X Average Risk   9.6   7.1  3 X Average Risk  23.4   11.0        Use the calculated Patient Ratio above and the CHD Risk Table to determine the patient's CHD Risk.        ATP III CLASSIFICATION (LDL):  <100     mg/dL   Optimal   100-129  mg/dL   Near or Above                    Optimal  130-159  mg/dL   Borderline  160-189  mg/dL   High  >190     mg/dL   Very High    Physical Findings: AIMS: Facial and Oral Movements Muscles of Facial Expression: None, normal Lips and Perioral Area: None, normal Jaw: None, normal Tongue: None, normal,Extremity Movements Upper (arms, wrists, hands, fingers): None, normal Lower (legs, knees, ankles, toes): None, normal, Trunk Movements Neck, shoulders, hips: None, normal, Overall Severity Severity of abnormal movements (highest score from questions above): None, normal Incapacitation due to abnormal movements: None, normal Patient's awareness of abnormal movements (rate only patient's report): No Awareness, Dental Status Current problems with teeth and/or dentures?: No Does patient usually wear dentures?: No  CIWA:  CIWA-Ar Total: 1 COWS:  COWS Total Score: 2  Musculoskeletal: Strength & Muscle Tone: within normal limits- no current tremors or motor restlessness  Gait & Station: normal  Patient leans: N/A  Psychiatric Specialty Exam: Physical Exam  Vitals reviewed. Constitutional: She is oriented to person, place, and time.  Cardiovascular: Normal rate.   Neurological: She is alert and oriented to person, place, and time.  Psychiatric: She has a normal mood and affect. Her behavior is normal. Thought content is not paranoid. She expresses no homicidal and no suicidal ideation.    Review of Systems  Psychiatric/Behavioral: Positive for depression and hallucinations. The patient has insomnia.   All other systems reviewed and are negative. denies chest pain or shortness of breath  Blood pressure 111/69, pulse 100, temperature 97.3 F (36.3 C), resp. rate 16, height 5\' 1"  (1.549 m), weight 68.7 kg (151 lb 8 oz), SpO2 97 %.Body mass index is 28.63 kg/m.  General Appearance: Casual and Fairly Groomed  Eye Contact:  Good  Speech:  Normal Rate, clear, coherent  Volume:   Normal  Mood:  Euthymic today  Affect:  Congruent  Thought Process:  Goal Directed and Descriptions of Associations: Intact  Orientation:  Full (Time, Place, and Person)  Thought Content:  Continues to report that her auditory hallucinations have resolved  Suicidal Thoughts:  No currently denies any suicidal plan or intention and contracts for safety on the unit   Homicidal Thoughts:  No  Memory: recent and remote grossly intact    Judgement:  Other:  improving  Insight:  improving   Psychomotor Activity:  Normal  Concentration:  Concentration: Good and Attention Span: Good  Recall:  Good  Fund of Knowledge:  Good  Language:  Good  Akathisia:  No  Handed:  Right  AIMS (if indicated):     Assets:  Desire for Improvement Resilience  ADL's:  Intact  Cognition:  WNL  Sleep:  Number of Hours: 6.75    Assessment: Alcohol-induced mood disorder with depressive symptoms (HCC) improving, and a good candidate for rehab facility for which she may receive placement early in the week.  On 11/03/2016 , I have reviewed medications below and concur with regimen with the following changes:   Treatment Plan Summary:  Reviewed 11/03/2016 Daily contact with patient to assess and evaluate symptoms and progress in treatment and Medication management  Encourage group and milieu participation to work on coping skills and symptom reduction Continue to encourage efforts to work on sobriety and relapse prevention  Treatment team working on disposition planning- see above  Continue  Risperidone  2 mg QHS for ongoing  psychotic symptoms Continue Celexa 20 mg QDAY  for depression Continue Campral 666 mg TID for alcohol cravings Increase Trazodone slightly to 150 mg QHS PRN for insomnia  [place], [name], FNP  BC 11/03/2016, 9:40 AM   I reviewed chart and agreed with the findings and treatment Plan of the patient.  Berniece Andreas, MD

## 2016-11-03 NOTE — Plan of Care (Signed)
Problem: Activity: Goal: Interest or engagement in activities will improve Outcome: Progressing Remains present in the milieu interacting with peers, engaged  Problem: Health Behavior/Discharge Planning: Goal: Ability to identify changes in lifestyle to reduce recurrence of condition will improve Outcome: Progressing Verbalizes realization that her home environment with friends and significant other who continue to use drugs and alcohol is a dangerous one for her, and is motivated to go into rehab and a different living situation in order to improve her chances of recovery. Goal: Identification of resources available to assist in meeting health care needs will improve Outcome: Progressing Aware of programs that would be appropriate for her and is reaching out and working with social work.

## 2016-11-04 MED ORDER — TRAZODONE HCL 100 MG PO TABS
200.0000 mg | ORAL_TABLET | Freq: Every day | ORAL | Status: DC
  Administered 2016-11-04 – 2016-11-06 (×3): 200 mg via ORAL
  Filled 2016-11-04 (×5): qty 2
  Filled 2016-11-04: qty 42

## 2016-11-04 NOTE — Progress Notes (Signed)
[name] had been up and visible in milieu this evening, did attend and participate in evening group activity. [name] has been pleasant and interacting appropriately with peers. She spoke about how she is to go to a long term treatment facility this week and spoke about how she can not go home because she would end up back doing the same thing. [name] did receive all bedtime medications without incident and did not verbalize any complaints of pain. A. Support and encouragement provided. R. Safety maintained, will continue to monitor.

## 2016-11-04 NOTE — Progress Notes (Signed)
[facility] MD Progress Note  11/04/2016 5:27 PM [name]  MRN:  [id number] Subjective:  "'I'm not ready to go home. I need to go to rehab."  Objective: Pt seen and chart reviewed. Pt is alert/oriented x4, calm, cooperative, and appropriate to situation. Pt denies suicidal/homicidal ideation and psychosis and does not appear to be responding to internal stimuli. Pt continues to report that she would like to go to rehab. She cites poor sleep last night stating that the meds helped a little more but not enough.    Principal Problem: Alcohol-induced mood disorder with depressive symptoms ([place]) Diagnosis:   Patient Active Problem List   Diagnosis Date Noted  . MDD (major depressive disorder), recurrent severe, without psychosis ([place]) [F33.2] 11/03/2016    Priority: High  . Alcohol-induced mood disorder with depressive symptoms ([place]) [F10.94] 10/26/2016    Priority: High  . Poor dentition [K08.9] 06/26/2016  . Essential hypertension [I10] 06/26/2016  . Hyperlipidemia [E78.5] 06/26/2016  . Elevated transaminase level [R74.0] 08/29/2015  . Health care maintenance [Z00.00] 12/31/2013  . Polysubstance abuse (h/o cocaine, ETOH, and tobacco abuse)  [F19.10]   . Primary vulvar squamous cell carcinoma s/p resection 2011 [C51.9] 02/13/2010  . 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 TOBACCO ABUSE [F17.200] 04/12/2006   Total Time spent with patient: 30 minutes    Past Medical History:  Past Medical History:  Diagnosis Date  . Cocaine abuse   . ETOH abuse   . Hypothyroidism   . Unspecified mood (affective) disorder [facility])     Past Surgical History:  Procedure Laterality Date  . radioactive iodine ablation     graves disease s/p   Family History: History reviewed. No pertinent family history. Family Psychiatric  History:  Social History:  History  Alcohol Use No     History  Drug Use No    Comment: crack    Social History   Social History  . Marital status: Single   Spouse name: N/A  . Number of children: N/A  . Years of education: N/A   Social History Main Topics  . Smoking status: Current Every Day Smoker    Packs/day: 0.30    Types: Cigarettes  . Smokeless tobacco: Never Used  . Alcohol use No  . Drug use: No     Comment: crack  . Sexual activity: Yes    Partners: Male    Birth control/ protection: None   Other Topics Concern  . None   Social History Narrative  . None   Additional Social History:    Pain Medications: Pt denies Prescriptions: Hydroxyine per spelling on the intake sheet, Pamoate, Olanzapine, and Levothyroxine Over the Counter: Pt denies History of alcohol / drug use?: Yes Longest period of sobriety (when/how long): Pt sts she has never been without either Negative Consequences of Use: Financial Name of Substance 1: Alcohol 1 - Age of First Use: 17 1 - Amount (size/oz): 3-4 40 oz per day 1 - Frequency: daily 1 - Duration: 39 years 1 - Last Use / Amount: 10/23/16 Name of Substance 2: Crack 2 - Age of First Use: 41 pt could not say for sure 2 - Amount (size/oz): $40 per day 2 - Frequency: daily 2 - Duration: 10 + years 2 - Last Use / Amount: 10/27/16  Sleep: Fair  Appetite:  Good  Current Medications: Current Facility-Administered Medications  Medication Dose Route Frequency Provider Last Rate Last Dose  . acamprosate (CAMPRAL) tablet 666 mg  666 mg Oral TID WC  Cobos, Myer Peer, MD   666 mg at 11/04/16 1658  . acetaminophen (TYLENOL) tablet 650 mg  650 mg Oral Q6H PRN Ethelene Hal, NP      . alum & mag hydroxide-simeth (MAALOX/MYLANTA) 200-200-20 MG/5ML suspension 30 mL  30 mL Oral Q4H PRN Ethelene Hal, NP      . citalopram (CELEXA) tablet 20 mg  20 mg Oral Daily Ethelene Hal, NP   20 mg at 11/04/16 0748  . hydrOXYzine (ATARAX/VISTARIL) tablet 25 mg  25 mg Oral Q6H PRN Ethelene Hal, NP   25 mg at 10/30/16 2319  . levothyroxine (SYNTHROID, LEVOTHROID) tablet 75 mcg  75 mcg  Oral Q0600 Cobos, Myer Peer, MD   75 mcg at 11/04/16 0620  . magnesium hydroxide (MILK OF MAGNESIA) suspension 30 mL  30 mL Oral Daily PRN Ethelene Hal, NP      . multivitamin with minerals tablet 1 tablet  1 tablet Oral Daily Laverle Hobby, PA-C   1 tablet at 11/04/16 0747  . nicotine (NICODERM CQ - dosed in mg/24 hours) patch 21 mg  21 mg Transdermal Daily Cobos, Myer Peer, MD   21 mg at 11/04/16 0748  . risperiDONE (RISPERDAL) tablet 2 mg  2 mg Oral QHS Cobos, Myer Peer, MD   2 mg at 11/03/16 2042  . thiamine (VITAMIN B-1) tablet 100 mg  100 mg Oral Daily Patriciaann Clan E, PA-C   100 mg at 11/04/16 0747  . traZODone (DESYREL) tablet 150 mg  150 mg Oral QHS Ayauna Mcnay, [name], FNP   150 mg at 11/03/16 2042    Lab Results:  No results found for this or any previous visit (from the past 48 hour(s)).  Blood Alcohol level:  Lab Results  Component Value Date   ETH 208 (H) 10/24/2016   ETH  12/25/2008    <5        LOWEST DETECTABLE LIMIT FOR SERUM ALCOHOL IS 5 mg/dL FOR MEDICAL PURPOSES ONLY    Metabolic Disorder Labs: No results found for: HGBA1C, MPG No results found for: PROLACTIN Lab Results  Component Value Date   CHOL 191 09/12/2011   TRIG 64 09/12/2011   HDL 66 09/12/2011   CHOLHDL 2.9 09/12/2011   VLDL 13 09/12/2011   LDLCALC 112 (H) 09/12/2011   LDLCALC  12/26/2008    71        Total Cholesterol/HDL:CHD Risk Coronary Heart Disease Risk Table                     Men   Women  1/2 Average Risk   3.4   3.3  Average Risk       5.0   4.4  2 X Average Risk   9.6   7.1  3 X Average Risk  23.4   11.0        Use the calculated Patient Ratio above and the CHD Risk Table to determine the patient's CHD Risk.        ATP III CLASSIFICATION (LDL):  <100     mg/dL   Optimal  100-129  mg/dL   Near or Above                    Optimal  130-159  mg/dL   Borderline  160-189  mg/dL   High  >190     mg/dL   Very High    Physical Findings: AIMS: Facial and Oral  Movements Muscles  of Facial Expression: None, normal Lips and Perioral Area: None, normal Jaw: None, normal Tongue: None, normal,Extremity Movements Upper (arms, wrists, hands, fingers): None, normal Lower (legs, knees, ankles, toes): None, normal, Trunk Movements Neck, shoulders, hips: None, normal, Overall Severity Severity of abnormal movements (highest score from questions above): None, normal Incapacitation due to abnormal movements: None, normal Patient's awareness of abnormal movements (rate only patient's report): No Awareness, Dental Status Current problems with teeth and/or dentures?: No Does patient usually wear dentures?: No  CIWA:  CIWA-Ar Total: 1 COWS:  COWS Total Score: 2  Musculoskeletal: Strength & Muscle Tone: within normal limits Gait & Station: normal  Patient leans: N/A  Psychiatric Specialty Exam: Physical Exam  Vitals reviewed. Constitutional: She is oriented to person, place, and time.  Cardiovascular: Normal rate.   Neurological: She is alert and oriented to person, place, and time.  Psychiatric: She has a normal mood and affect. Her behavior is normal. Thought content is not paranoid. She expresses no homicidal and no suicidal ideation.    Review of Systems  Psychiatric/Behavioral: Positive for depression and hallucinations. The patient has insomnia.   All other systems reviewed and are negative. denies chest pain or shortness of breath  Blood pressure 130/84, pulse 91, temperature 97.7 F (36.5 C), temperature source Oral, resp. rate 16, height 5\' 1"  (1.549 m), weight 68.7 kg (151 lb 8 oz), SpO2 97 %.Body mass index is 28.63 kg/m.  General Appearance: Casual and Fairly Groomed  Eye Contact:  Good  Speech:  Normal Rate, clear, coherent  Volume:  Normal  Mood:  Euthymic today  Affect:  Congruent  Thought Process:  Goal Directed and Descriptions of Associations: Intact  Orientation:  Full (Time, Place, and Person)  Thought Content: Focused on sleep  and rehab options  Suicidal Thoughts:  No   Homicidal Thoughts:  No  Memory: recent and remote grossly intact    Judgement:  Other:  improving  Insight:  improving   Psychomotor Activity:  Normal  Concentration:  Concentration: Good and Attention Span: Good  Recall:  Good  Fund of Knowledge:  Good  Language:  Good  Akathisia:  No  Handed:  Right  AIMS (if indicated):     Assets:  Desire for Improvement Resilience  ADL's:  Intact  Cognition:  WNL  Sleep:  Number of Hours: 6.75    Assessment: Alcohol-induced mood disorder with depressive symptoms (HCC) improving, and a good candidate for rehab facility for which she may receive placement early in the week.  On 11/04/2016 , I have reviewed medications below and concur with regimen with the following changes:   Treatment Plan Summary:  Reviewed 11/04/2016 Daily contact with patient to assess and evaluate symptoms and progress in treatment and Medication management  Encourage group and milieu participation to work on coping skills and symptom reduction Continue to encourage efforts to work on sobriety and relapse prevention  Treatment team working on disposition planning- see above  Continue  Risperidone  2 mg QHS for ongoing  psychotic symptoms Continue Celexa 20 mg QDAY  for depression Continue Campral 666 mg TID for alcohol cravings Increase Trazodone to 200 mg QHS PRN for insomnia  Katherine Mola, FNP  [facility] 11/04/2016, 5:27 PM

## 2016-11-04 NOTE — Progress Notes (Signed)
Patient attended group and said that her day was a 5. Her goal for tomorrow is to see the physician to discuss discharge plan.

## 2016-11-04 NOTE — BHH Group Notes (Signed)
[place] Group Notes:  (Nursing/MHT/Case Management/Adjunct)  Date:  11/04/2016  Time:  4:17 PM  Type of Therapy:  Psychoeducational Skills  Participation Level:  Active  Participation Quality:  Attentive  Affect:  Appropriate  Cognitive:  Appropriate  Insight:  Appropriate  Engagement in Group:  Engaged  Modes of Intervention:  Discussion and Education  Summary of Progress/Problems: Healthy Support Systems - patient attended, was engaged with content and peers. [name] 11/04/2016, 4:17 PM

## 2016-11-04 NOTE — Progress Notes (Signed)
Patient attended AA group meeting.  

## 2016-11-04 NOTE — BHH Group Notes (Signed)
[facility] Group Notes:  (Clinical Social Work)   11/04/2016    10:00-11:00AM  Summary of Progress/Problems:   The main focus of today's process group was to   1)  discuss the importance of adding supports  2)   plan what supports to add to improve wellness and recovery  An emphasis was placed on using therapist, doctor, therapy groups, 12-step groups, and problem-specific support groups to expand supports.    The patient stated if she was not in the hospital today, she would be out doing drugs.  She wants to go to rehab, and then stated she will add a therapist and sponsor as supports.  Type of Therapy:  Process Group with Motivational Interviewing  Participation Level:  Active  Participation Quality:  Attentive and Sharing  Affect:  Appropriate  Cognitive:  Appropriate  Insight:  Engaged  Engagement in Therapy:  Engaged  Modes of Intervention:   Education, Support and Processing  Selmer Dominion, [place] 11/04/2016    12:42 PM

## 2016-11-04 NOTE — Plan of Care (Signed)
Problem: Safety: Goal: Ability to remain free from injury will improve Outcome: Progressing Pt safe on the unit at this time   

## 2016-11-04 NOTE — Progress Notes (Signed)
D: Pt denies SI/HI/AVH. Pt is pleasant and cooperative. Pt seen interacting with peers this evening. Pt continues to endorse needing to D/C and go straight to a Tx facility due to her possibly relapsing if she is D/C home.   A: Pt was offered support and encouragement. Pt was given scheduled medications. Pt was encourage to attend groups. Q 15 minute checks were done for safety.   R:Pt attends groups and interacts well with peers and staff. Pt is taking medication. Pt has no complaints.Pt receptive to treatment and safety maintained on unit.

## 2016-11-05 NOTE — Progress Notes (Signed)
D: Pt presents animated on approach. Pt rates depression 5/10. Anxiety 3/10. Pt endorses suicidal thoughts with no plan or intent. Pt verbally contracts for safety. Pt seeking long-term tx at [facility] or [facility] residential facilities. Pt tolerating meds well. No side effects to meds verbalized by pt. A: Medications reviewed with pt. Medications administered as ordered per MD. Verbal support provided. Pt encouraged to attend groups. 15 minute checks performed for safety. R: Pt compliant with tx.

## 2016-11-05 NOTE — Progress Notes (Signed)
[facility] MD Progress Note  11/05/2016 1:56 PM [name]  MRN:  [id number] Subjective:   Patient remains to ruminate about the family "turning their back on her." Objective:  I have discussed case with treatment team and have met with patient.  Denies SI today and no psychosis.  She is reiterated that she would like to defer on an early and premature discharge until she is ready and to enter into a long term detox program like [facility] or [facility].   Visible in day room, going to groups, behavior on unit in good control.   Principal Problem: Alcohol-induced mood disorder with depressive symptoms ([place]) Diagnosis:   Patient Active Problem List   Diagnosis Date Noted  . MDD (major depressive disorder), recurrent severe, without psychosis ([place]) [F33.2] 11/03/2016  . Alcohol-induced mood disorder with depressive symptoms ([place]) [F10.94] 10/26/2016  . Poor dentition [K08.9] 06/26/2016  . Essential hypertension [I10] 06/26/2016  . Hyperlipidemia [E78.5] 06/26/2016  . Elevated transaminase level [R74.0] 08/29/2015  . Health care maintenance [Z00.00] 12/31/2013  . Polysubstance abuse (h/o cocaine, ETOH, and tobacco abuse)  [F19.10]   . Primary vulvar squamous cell carcinoma s/p resection 2011 [C51.9] 02/13/2010  . 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 TOBACCO ABUSE [F17.200] 04/12/2006   Total Time spent with patient: 20 minutes  Past Psychiatric History: see HPI  Past Medical History:  Past Medical History:  Diagnosis Date  . Cocaine abuse   . ETOH abuse   . Hypothyroidism   . Unspecified mood (affective) disorder [facility])     Past Surgical History:  Procedure Laterality Date  . radioactive iodine ablation     graves disease s/p   Family History: History reviewed. No pertinent family history. Family Psychiatric  History: see HPI Social History:  History  Alcohol Use No     History  Drug Use No    Comment: crack    Social History   Social History  . Marital status:  Single    Spouse name: N/A  . Number of children: N/A  . Years of education: N/A   Social History Main Topics  . Smoking status: Current Every Day Smoker    Packs/day: 0.30    Types: Cigarettes  . Smokeless tobacco: Never Used  . Alcohol use No  . Drug use: No     Comment: crack  . Sexual activity: Yes    Partners: Male    Birth control/ protection: None   Other Topics Concern  . None   Social History Narrative  . None   Additional Social History:    Pain Medications: Pt denies Prescriptions: Hydroxyine per spelling on the intake sheet, Pamoate, Olanzapine, and Levothyroxine Over the Counter: Pt denies History of alcohol / drug use?: Yes Longest period of sobriety (when/how long): Pt sts she has never been without either Negative Consequences of Use: Financial Name of Substance 1: Alcohol 1 - Age of First Use: 17 1 - Amount (size/oz): 3-4 40 oz per day 1 - Frequency: daily 1 - Duration: 39 years 1 - Last Use / Amount: 10/23/16 Name of Substance 2: Crack 2 - Age of First Use: 41 pt could not say for sure 2 - Amount (size/oz): $40 per day 2 - Frequency: daily 2 - Duration: 10 + years 2 - Last Use / Amount: 10/27/16  Sleep: better than on admission, reports some nightmares   Appetite:  Good  Current Medications: Current Facility-Administered Medications  Medication Dose Route Frequency Provider Last Rate Last Dose  .  acamprosate (CAMPRAL) tablet 666 mg  666 mg Oral TID WC Cobos, Myer Peer, MD   666 mg at 11/05/16 1206  . acetaminophen (TYLENOL) tablet 650 mg  650 mg Oral Q6H PRN Ethelene Hal, NP      . alum & mag hydroxide-simeth (MAALOX/MYLANTA) 200-200-20 MG/5ML suspension 30 mL  30 mL Oral Q4H PRN Ethelene Hal, NP      . citalopram (CELEXA) tablet 20 mg  20 mg Oral Daily Ethelene Hal, NP   20 mg at 11/05/16 0818  . hydrOXYzine (ATARAX/VISTARIL) tablet 25 mg  25 mg Oral Q6H PRN Ethelene Hal, NP   25 mg at 10/30/16 2319  .  levothyroxine (SYNTHROID, LEVOTHROID) tablet 75 mcg  75 mcg Oral Q0600 Cobos, Myer Peer, MD   75 mcg at 11/05/16 0615  . magnesium hydroxide (MILK OF MAGNESIA) suspension 30 mL  30 mL Oral Daily PRN Ethelene Hal, NP      . multivitamin with minerals tablet 1 tablet  1 tablet Oral Daily Laverle Hobby, PA-C   1 tablet at 11/05/16 0818  . nicotine (NICODERM CQ - dosed in mg/24 hours) patch 21 mg  21 mg Transdermal Daily Cobos, Myer Peer, MD   21 mg at 11/05/16 0818  . risperiDONE (RISPERDAL) tablet 2 mg  2 mg Oral QHS Cobos, Myer Peer, MD   2 mg at 11/04/16 2119  . thiamine (VITAMIN B-1) tablet 100 mg  100 mg Oral Daily Patriciaann Clan E, PA-C   100 mg at 11/05/16 0818  . traZODone (DESYREL) tablet 200 mg  200 mg Oral QHS [place], [name], FNP   200 mg at 11/04/16 2119    Lab Results:  No results found for this or any previous visit (from the past 48 hour(s)).  Blood Alcohol level:  Lab Results  Component Value Date   ETH 208 (H) 10/24/2016   ETH  12/25/2008    <5        LOWEST DETECTABLE LIMIT FOR SERUM ALCOHOL IS 5 mg/dL FOR MEDICAL PURPOSES ONLY    Metabolic Disorder Labs: No results found for: HGBA1C, MPG No results found for: PROLACTIN Lab Results  Component Value Date   CHOL 191 09/12/2011   TRIG 64 09/12/2011   HDL 66 09/12/2011   CHOLHDL 2.9 09/12/2011   VLDL 13 09/12/2011   LDLCALC 112 (H) 09/12/2011   LDLCALC  12/26/2008    71        Total Cholesterol/HDL:CHD Risk Coronary Heart Disease Risk Table                     Men   Women  1/2 Average Risk   3.4   3.3  Average Risk       5.0   4.4  2 X Average Risk   9.6   7.1  3 X Average Risk  23.4   11.0        Use the calculated Patient Ratio above and the CHD Risk Table to determine the patient's CHD Risk.        ATP III CLASSIFICATION (LDL):  <100     mg/dL   Optimal  100-129  mg/dL   Near or Above                    Optimal  130-159  mg/dL   Borderline  160-189  mg/dL   High  >190     mg/dL    Very High  Physical Findings: AIMS: Facial and Oral Movements Muscles of Facial Expression: None, normal Lips and Perioral Area: None, normal Jaw: None, normal Tongue: None, normal,Extremity Movements Upper (arms, wrists, hands, fingers): None, normal Lower (legs, knees, ankles, toes): None, normal, Trunk Movements Neck, shoulders, hips: None, normal, Overall Severity Severity of abnormal movements (highest score from questions above): None, normal Incapacitation due to abnormal movements: None, normal Patient's awareness of abnormal movements (rate only patient's report): No Awareness, Dental Status Current problems with teeth and/or dentures?: No Does patient usually wear dentures?: No  CIWA:  CIWA-Ar Total: 1 COWS:  COWS Total Score: 2  Musculoskeletal: Strength & Muscle Tone: within normal limits- no current tremors or motor restlessness  Gait & Station: normal  Patient leans: N/A  Psychiatric Specialty Exam: Physical Exam  Nursing note and vitals reviewed. Constitutional: She is oriented to person, place, and time.  Cardiovascular: Normal rate.   Neurological: She is alert and oriented to person, place, and time.  Psychiatric: She has a normal mood and affect. Her behavior is normal. Thought content is not paranoid. She expresses no homicidal and no suicidal ideation.    Review of Systems  Psychiatric/Behavioral: Positive for depression and hallucinations.  denies chest pain or shortness of breath  Blood pressure 104/82, pulse (!) 107, temperature 97.7 F (36.5 C), temperature source Oral, resp. rate 16, height 5' 1"  (1.549 m), weight 68.7 kg (151 lb 8 oz), SpO2 97 %.Body mass index is 28.63 kg/m.  General Appearance: improving grooming   Eye Contact:  Good  Speech:  Normal Rate  Volume:  Normal  Mood:  improved mood and range of affect   Affect:  fuller in range   Thought Process:  Goal Directed and Descriptions of Associations: Intact  Orientation:  Full (Time,  Place, and Person)  Thought Content:  reports auditory hallucinations have resolved, does not appear internally preoccupied and no delusions expressed   Suicidal Thoughts:  No currently denies any suicidal plan or intention and contracts for safety on the unit   Homicidal Thoughts:  No  Memory: recent and remote grossly intact    Judgement:  Other:  improving  Insight:  improving   Psychomotor Activity:  Normal-no significant distal tremors, no restlessness, no diaphoresis  Concentration:  Concentration: Good and Attention Span: Good  Recall:  Good  Fund of Knowledge:  Good  Language:  Good  Akathisia:  No  Handed:  Right  AIMS (if indicated):     Assets:  Desire for Improvement Resilience  ADL's:  Intact  Cognition:  WNL  Sleep:  Number of Hours: 6.75    Assessment - patient continues to improve and presents with improved mood and fuller range of affect. Psychotic symptoms have resolved. She is future oriented. Her major focus is to proceed with plan of going to a rehab as she is apprehensive she may likely relapse otherwise . Thus far tolerating medications well .  Treatment Plan Summary:  Reviewed 11/05/2016 Daily contact with patient to assess and evaluate symptoms and progress in treatment and Medication management  Encourage group and milieu participation to work on coping skills and symptom reduction Continue to encourage efforts to work on sobriety and relapse prevention  Treatment team working on disposition planning- see above  Continue  Risperidone  2 mg QHS for ongoing  psychotic symptoms Continue Celexa 20 mg QDAY  for depression Continue Campral 666 mgrs TID for alcohol cravings Trazodone 50 mgrs QHS PRN for insomnia if needed   [facility] May  Malachi Carl, [place] 11/05/2016, 1:56 PM

## 2016-11-05 NOTE — BHH Group Notes (Signed)
[place] LCSW Group Therapy  11/05/2016 1:15pm  Type of Therapy: Group Therapy   Topic: Overcoming Obstacles  Participation Level: Active  Participation Quality: Appropriate   Affect: Appropriate  Cognitive: Appropriate and Oriented  Insight: Developing/Improving and Improving  Engagement in Therapy: Improving  Modes of Intervention: Discussion, Exploration, Problem-solving and Support  Description of Group:  In this group patients will be encouraged to explore what they see as obstacles to their own wellness and recovery. They will be guided to discuss their thoughts, feelings, and behaviors related to these obstacles. The group will process together ways to cope with barriers, with attention given to specific choices patients can make. Each patient will be challenged to identify changes they are motivated to make in order to overcome their obstacles. This group will be process-oriented, with patients participating in exploration of their own experiences as well as giving and receiving support and challenge from other group members.  Summary of Patient Progress:  Pt states that her biggest obstacle is finding treatment once she leaves the hospital. Pt has a phone screening with [facility] today and is hopeful that it will go well. After stay at [facility] pt plans to enter another residential treatment program.  Therapeutic Modalities:  Cognitive Behavioral Therapy Solution Focused Therapy Motivational Interviewing Relapse Prevention Therapy  Georga Kaufmann, MSW, [place]

## 2016-11-05 NOTE — BHH Group Notes (Signed)
[facility] LCSW Aftercare Discharge Planning Group Note   11/05/2016  8:45 AM  Participation Quality: Pt invited. Did not attend.  Georga Kaufmann, MSW, LCSWA 11/05/2016 1:15 PM

## 2016-11-05 NOTE — Progress Notes (Signed)
Adult Psychoeducational Group Note  Date:  11/05/2016 Time:  9:12 PM  Group Topic/Focus:  Spirituality:   The focus of this group is to discuss how one's spirituality can aide in recovery. Wrap-Up Group:   The focus of this group is to help patients review their daily goal of treatment and discuss progress on daily workbooks.  Participation Level:  Active  Participation Quality:  Appropriate  Affect:  Appropriate  Cognitive:  Appropriate  Insight: Appropriate  Engagement in Group:  Engaged  Modes of Intervention:  Discussion  Additional Comments:  The patient expressed that she attend groups.The patient also said he rates today a 9.   [name] 11/05/2016, 9:12 PM

## 2016-11-05 NOTE — Progress Notes (Signed)
Recreation Therapy Notes  Date: 11/05/16 Time: 0930 Location: [address] Dayroom  Group Topic: Stress Management  Goal Area(s) Addresses:  Patient will verbalize importance of using healthy stress management.  Patient will identify positive emotions associated with healthy stress management.   Behavioral Response: Engaged  Intervention: Stress Management  Activity :  De-escalating Stress.  LRT introduced the stress management technique of meditation.  LRT played a meditation from the Calm app to help patients engage in the practice of de-escalating stress.  Patients were to follow along as the meditation played to participate in the technique.  Education:  Stress Management, Discharge Planning.   Education Outcome: Acknowledges edcuation/In group clarification offered/Needs additional education  Clinical Observations/Feedback: Pt attended group.    Victorino Sparrow, LRT/CTRS         Ria Comment, Dennys Guin A 11/05/2016 11:30 AM

## 2016-11-06 NOTE — Plan of Care (Signed)
Problem: Education: Goal: Verbalization of understanding the information provided will improve Outcome: Progressing Patient verbalizing understanding of education, information provided.  Problem: Coping: Goal: Ability to identify and develop effective coping behavior will improve Outcome: Progressing Patient mood, complaints improving. Awaiting bed for LT tx.

## 2016-11-06 NOTE — Progress Notes (Signed)
D: Patient up and visible in the milieu. Spoke with patient 1:1. Rating depression at a 5/10, hopelessness at a 1/10 and anxiety at a 2/10. Rates sleep as fair, appetite as fair, energy as normal and concentration as poor. Patient's affect animated, appropriate with pleasant mood. States goal for today is to "stay clean, go to rehab." Denies pain, physical problems.   A: Medicated per orders, no prns requested or required. Emotional support offered and self inventory reviewed. Encouraged completion of Suicide Safety Plan. Discussed POC with MD, SW.   R: Patient verbalizes understanding of POC.  Patient denies SI/HI and remains safe on level III obs.

## 2016-11-06 NOTE — Progress Notes (Signed)
[facility] MD Progress Note  11/06/2016 5:15 PM [name]  MRN:  [id number] Subjective:   56 yo AAF, single, homeless, unemployed, no income. Background history of SUD and mood disorder. Presented to the ER in company of the local police. Patient was intoxicated with alcohol at presentation. BAL was 308 mg/dl. History of cocaine use.  Chart reviewed today. Patient discussed at team.  Staff reports that she has been doing well. No behavioral issues. She has been participating at unit groups and activities.   Seen today. Says she is doing fine. No depression. Normal energy and activity. She hopes to get into rehab soon. No suicidal thoughts. No thoughts of violence. No homicidal thoughts. No evidence of psychosis. No evidence of mania. No withdrawal symptom.   Principal Problem: Alcohol-induced mood disorder with depressive symptoms ([place]) Diagnosis:   Patient Active Problem List   Diagnosis Date Noted  . MDD (major depressive disorder), recurrent severe, without psychosis ([place]) [F33.2] 11/03/2016  . Alcohol-induced mood disorder with depressive symptoms ([place]) [F10.94] 10/26/2016  . Poor dentition [K08.9] 06/26/2016  . Essential hypertension [I10] 06/26/2016  . Hyperlipidemia [E78.5] 06/26/2016  . Elevated transaminase level [R74.0] 08/29/2015  . Health care maintenance [Z00.00] 12/31/2013  . Polysubstance abuse (h/o cocaine, ETOH, and tobacco abuse)  [F19.10]   . Primary vulvar squamous cell carcinoma s/p resection 2011 [C51.9] 02/13/2010  . 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 TOBACCO ABUSE [F17.200] 04/12/2006   Total Time spent with patient: 20 minutes  Past Psychiatric History: As in H&P  Past Medical History:  Past Medical History:  Diagnosis Date  . Cocaine abuse   . ETOH abuse   . Hypothyroidism   . Unspecified mood (affective) disorder [facility])     Past Surgical History:  Procedure Laterality Date  . radioactive iodine ablation     graves disease s/p    Family History: History reviewed. No pertinent family history. Family Psychiatric  History: As in H&P Social History:  History  Alcohol Use No     History  Drug Use No    Comment: crack    Social History   Social History  . Marital status: Single    Spouse name: N/A  . Number of children: N/A  . Years of education: N/A   Social History Main Topics  . Smoking status: Current Every Day Smoker    Packs/day: 0.30    Types: Cigarettes  . Smokeless tobacco: Never Used  . Alcohol use No  . Drug use: No     Comment: crack  . Sexual activity: Yes    Partners: Male    Birth control/ protection: None   Other Topics Concern  . None   Social History Narrative  . None   Additional Social History:    Pain Medications: Pt denies Prescriptions: Hydroxyine per spelling on the intake sheet, Pamoate, Olanzapine, and Levothyroxine Over the Counter: Pt denies History of alcohol / drug use?: Yes Longest period of sobriety (when/how long): Pt sts she has never been without either Negative Consequences of Use: Financial Name of Substance 1: Alcohol 1 - Age of First Use: 17 1 - Amount (size/oz): 3-4 40 oz per day 1 - Frequency: daily 1 - Duration: 39 years 1 - Last Use / Amount: 10/23/16 Name of Substance 2: Crack 2 - Age of First Use: 41 pt could not say for sure 2 - Amount (size/oz): $40 per day 2 - Frequency: daily 2 - Duration: 10 + years 2 - Last Use / Amount:  10/27/16                Sleep: Good  Appetite:  Good  Current Medications: Current Facility-Administered Medications  Medication Dose Route Frequency Provider Last Rate Last Dose  . acamprosate (CAMPRAL) tablet 666 mg  666 mg Oral TID WC Cobos, Myer Peer, MD   666 mg at 11/06/16 1645  . acetaminophen (TYLENOL) tablet 650 mg  650 mg Oral Q6H PRN Ethelene Hal, NP      . alum & mag hydroxide-simeth (MAALOX/MYLANTA) 200-200-20 MG/5ML suspension 30 mL  30 mL Oral Q4H PRN Ethelene Hal, NP       . citalopram (CELEXA) tablet 20 mg  20 mg Oral Daily Ethelene Hal, NP   20 mg at 11/06/16 7209  . hydrOXYzine (ATARAX/VISTARIL) tablet 25 mg  25 mg Oral Q6H PRN Ethelene Hal, NP   25 mg at 10/30/16 2319  . levothyroxine (SYNTHROID, LEVOTHROID) tablet 75 mcg  75 mcg Oral Q0600 Cobos, Myer Peer, MD   75 mcg at 11/06/16 0603  . magnesium hydroxide (MILK OF MAGNESIA) suspension 30 mL  30 mL Oral Daily PRN Ethelene Hal, NP      . multivitamin with minerals tablet 1 tablet  1 tablet Oral Daily Laverle Hobby, PA-C   1 tablet at 11/06/16 4709  . nicotine (NICODERM CQ - dosed in mg/24 hours) patch 21 mg  21 mg Transdermal Daily Cobos, Myer Peer, MD   21 mg at 11/06/16 0813  . risperiDONE (RISPERDAL) tablet 2 mg  2 mg Oral QHS Cobos, Myer Peer, MD   2 mg at 11/05/16 2036  . thiamine (VITAMIN B-1) tablet 100 mg  100 mg Oral Daily Laverle Hobby, PA-C   100 mg at 11/06/16 6283  . traZODone (DESYREL) tablet 200 mg  200 mg Oral QHS Withrow, [name], FNP   200 mg at 11/05/16 2036    Lab Results: No results found for this or any previous visit (from the past 48 hour(s)).  Blood Alcohol level:  Lab Results  Component Value Date   ETH 208 (H) 10/24/2016   ETH  12/25/2008    <5        LOWEST DETECTABLE LIMIT FOR SERUM ALCOHOL IS 5 mg/dL FOR MEDICAL PURPOSES ONLY    Metabolic Disorder Labs: No results found for: HGBA1C, MPG No results found for: PROLACTIN Lab Results  Component Value Date   CHOL 191 09/12/2011   TRIG 64 09/12/2011   HDL 66 09/12/2011   CHOLHDL 2.9 09/12/2011   VLDL 13 09/12/2011   LDLCALC 112 (H) 09/12/2011   LDLCALC  12/26/2008    71        Total Cholesterol/HDL:CHD Risk Coronary Heart Disease Risk Table                     Men   Women  1/2 Average Risk   3.4   3.3  Average Risk       5.0   4.4  2 X Average Risk   9.6   7.1  3 X Average Risk  23.4   11.0        Use the calculated Patient Ratio above and the CHD Risk Table to determine  the patient's CHD Risk.        ATP III CLASSIFICATION (LDL):  <100     mg/dL   Optimal  100-129  mg/dL   Near or Above  Optimal  130-159  mg/dL   Borderline  160-189  mg/dL   High  >190     mg/dL   Very High    Physical Findings: AIMS: Facial and Oral Movements Muscles of Facial Expression: None, normal Lips and Perioral Area: None, normal Jaw: None, normal Tongue: None, normal,Extremity Movements Upper (arms, wrists, hands, fingers): None, normal Lower (legs, knees, ankles, toes): None, normal, Trunk Movements Neck, shoulders, hips: None, normal, Overall Severity Severity of abnormal movements (highest score from questions above): None, normal Incapacitation due to abnormal movements: None, normal Patient's awareness of abnormal movements (rate only patient's report): No Awareness, Dental Status Current problems with teeth and/or dentures?: No Does patient usually wear dentures?: No  CIWA:  CIWA-Ar Total: 1 COWS:  COWS Total Score: 2  Musculoskeletal: Strength & Muscle Tone: within normal limits Gait & Station: normal Patient leans: N/A  Psychiatric Specialty Exam: Physical Exam  Constitutional: She is oriented to person, place, and time. She appears well-developed.  HENT:  Head: Normocephalic and atraumatic.  Eyes: Conjunctivae are normal. Pupils are equal, round, and reactive to light.  Neck: Normal range of motion. Neck supple.  Cardiovascular: Normal rate and regular rhythm.   Respiratory: Effort normal and breath sounds normal.  GI: Soft. Bowel sounds are normal.  Musculoskeletal: Normal range of motion.  Neurological: She is alert and oriented to person, place, and time.  Skin: Skin is warm and dry.  Psychiatric:  As above    ROS  Blood pressure (!) 117/59, pulse 89, temperature 97.9 F (36.6 C), temperature source Oral, resp. rate 20, height 5\' 1"  (1.549 m), weight 68.7 kg (151 lb 8 oz), SpO2 97 %.Body mass index is 28.63 kg/m.  General  Appearance: Neatly dressed, pleasant, engaging well and cooperative. Appropriate behavior. Not in any distress. Good relatedness. Not internally stimulated  Eye Contact:  Good  Speech:  Clear and Coherent and Normal Rate  Volume:  Normal  Mood:  Euthymic  Affect:  Appropriate and Full Range  Thought Process:  Linear  Orientation:  Full (Time, Place, and Person)  Thought Content:  No delusional theme. No preoccupation with violent thoughts. No negative ruminations. No obsession.  No hallucination in any modality.   Suicidal Thoughts:  No  Homicidal Thoughts:  No  Memory:  Immediate;   Good Recent;   Good Remote;   Good  Judgement:  Good  Insight:  Good  Psychomotor Activity:  Normal  Concentration:  Concentration: Good and Attention Span: Good  Recall:  Good  Fund of Knowledge:  Good  Language:  Good  Akathisia:  No  Handed:    AIMS (if indicated):     Assets:  Communication Skills Desire for Improvement  ADL's:  Intact  Cognition:  WNL  Sleep:  Number of Hours: 6     Treatment Plan Summary: Patient has fully come off the effects of alcohol. She is euthymic. No psychosis or mania. She is not a danger to herself or others. We are awaiting appropriate placement. Would continue current regimen.   Artist Beach, MD 11/06/2016, 5:15 PM

## 2016-11-06 NOTE — BHH Group Notes (Signed)
[facility] Group Therapy 11/06/2016 1:15pm  Type of Therapy: Mental Health Association Presentation  Participation Level: Active  Participation Quality: Attentive  Affect: Appropriate  Cognitive: Oriented  Insight: Developing/Improving  Engagement in Therapy: Engaged  Modes of Intervention: Discussion, Education and Socialization  Summary of Progress/Problems: Mental Health Association ([place]) Speaker came to talk about his personal journey with substance abuse and addiction. The pt processed ways by which to relate to the speaker. [place] speaker provided handouts and educational information pertaining to groups and services offered by the [facility]. Pt was engaged in speaker's presentation and was receptive to resources provided.    Kara Mead. Marshell Levan, MSW, [place] 11/06/2016 3:02 PM

## 2016-11-06 NOTE — Progress Notes (Signed)
Recreation Therapy Notes  Animal-Assisted Activity (AAA) Program Checklist/Progress Notes Patient Eligibility Criteria Checklist & Daily Group note for Rec TxIntervention  Date: 05.08.2018 Time: 2:45pm Location: [address]   AAA/T Program Assumption of Risk Form signed by Patient/ or Parent Legal Guardian Yes  Patient is free of allergies or sever asthma Yes  Patient reports no fear of animals Yes  Patient reports no history of cruelty to animals Yes  Patient understands his/her participation is voluntary Yes  Patient washes hands before animal contact Yes  Patient washes hands after animal contact Yes  Behavioral Response: Engaged, Attentive   Education:Hand Washing, Appropriate Animal Interaction   Education Outcome: Acknowledges education.   Clinical Observations/Feedback: Patient attended session and interacted appropriately with therapy dog and peers.   Laureen Ochs Grover Robinson, LRT/CTRS        Mileena Rothenberger L 11/06/2016 3:04 PM

## 2016-11-06 NOTE — Progress Notes (Signed)
D. [name] had been up and visible in milieu this evening, did attend and participate in evening group activity. She spoke about how she is expecting to get into a long term treatment facility soon. She spoke about feeling better and ready to go to a treatment facility. She received bedtime medications without incident and did not verbalize any complaints of pain. A. Support and encouragement provided. R. Safety maintained, will continue to monitor.

## 2016-11-06 NOTE — BHH Group Notes (Signed)
[place] Group Notes:  (Nursing/MHT/Case Management/Adjunct)  Date:  11/06/2016  Time:  0900 am  Type of Therapy:  Mindfulness Meditation  Participation Level:  Active  Participation Quality:  Appropriate and Attentive  Affect:  Appropriate  Cognitive:  Alert and Appropriate  Insight:  Good  Engagement in Group:  Engaged  Modes of Intervention:  Support  Summary of Progress/Problems: Patient was engaged during the meditation exercise and she appeared to enjoy it.  We discussed using meditation as a coping technique for anxiety.  Zipporah Plants 11/06/2016, 12:09 PM

## 2016-11-07 MED ORDER — TRAZODONE HCL 100 MG PO TABS: 200.0000 mg | ORAL_TABLET | Freq: Every day | ORAL | 0 refills | Status: DC

## 2016-11-07 MED ORDER — LEVOTHYROXINE SODIUM 75 MCG PO TABS: 75.0000 ug | ORAL_TABLET | Freq: Every day | ORAL | 0 refills | Status: DC

## 2016-11-07 MED ORDER — ACAMPROSATE CALCIUM 333 MG PO TBEC: 666.0000 mg | DELAYED_RELEASE_TABLET | Freq: Three times a day (TID) | ORAL | 0 refills | Status: DC

## 2016-11-07 MED ORDER — CITALOPRAM HYDROBROMIDE 20 MG PO TABS: 20.0000 mg | ORAL_TABLET | Freq: Every day | ORAL | 0 refills | Status: DC

## 2016-11-07 MED ORDER — NICOTINE 21 MG/24HR TD PT24: 21.0000 mg | MEDICATED_PATCH | Freq: Every day | TRANSDERMAL | 0 refills | Status: DC

## 2016-11-07 MED ORDER — HYDROXYZINE HCL 25 MG PO TABS: 25.0000 mg | ORAL_TABLET | Freq: Four times a day (QID) | ORAL | 0 refills | Status: DC | PRN

## 2016-11-07 MED ORDER — RISPERIDONE 2 MG PO TABS: 2.0000 mg | ORAL_TABLET | Freq: Every day | ORAL | 0 refills | Status: DC

## 2016-11-07 NOTE — Progress Notes (Signed)
Pt has been accepted to [facility]. Pt will need to be ready with a 21 day supply by 12PM. [facility] representative will transport pt to the facility upon discharge.  Georga Kaufmann, MSW, [place]

## 2016-11-07 NOTE — BHH Suicide Risk Assessment (Signed)
[facility] Discharge Suicide Risk Assessment   Principal Problem: Alcohol-induced mood disorder with depressive symptoms [facility]) Discharge Diagnoses:  Patient Active Problem List   Diagnosis Date Noted  . MDD (major depressive disorder), recurrent severe, without psychosis ([place]) [F33.2] 11/03/2016  . Alcohol-induced mood disorder with depressive symptoms ([place]) [F10.94] 10/26/2016  . Poor dentition [K08.9] 06/26/2016  . Essential hypertension [I10] 06/26/2016  . Hyperlipidemia [E78.5] 06/26/2016  . Elevated transaminase level [R74.0] 08/29/2015  . Health care maintenance [Z00.00] 12/31/2013  . Polysubstance abuse (h/o cocaine, ETOH, and tobacco abuse)  [F19.10]   . Primary vulvar squamous cell carcinoma s/p resection 2011 [C51.9] 02/13/2010  . 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 TOBACCO ABUSE [F17.200] 04/12/2006    Total Time spent with patient: 45 minutes  Musculoskeletal: Strength & Muscle Tone: within normal limits Gait & Station: normal Patient leans: N/A  Psychiatric Specialty Exam: Review of Systems  Constitutional: Negative.   HENT: Negative.   Eyes: Negative.   Respiratory: Negative.   Cardiovascular: Negative.   Gastrointestinal: Negative.   Genitourinary: Negative.   Musculoskeletal: Negative.   Skin: Negative.   Neurological: Negative.   Endo/Heme/Allergies: Negative.   Psychiatric/Behavioral: Negative for depression, hallucinations, memory loss, substance abuse and suicidal ideas. The patient is not nervous/anxious and does not have insomnia.     Blood pressure 118/74, pulse 91, temperature 98 F (36.7 C), temperature source Oral, resp. rate 16, height 5\' 1"  (1.549 m), weight 68.7 kg (151 lb 8 oz), SpO2 97 %.Body mass index is 28.63 kg/m.  General Appearance: Neatly dressed, pleasant, engaging well and cooperative. Appropriate behavior. Not in any distress. Good relatedness. Not internally stimulated  Eye Contact::  Good  Speech:  Spontaneous, normal  prosody. Normal tone and rate.   Volume:  Normal  Mood:  Euthymic  Affect:  Appropriate and Full Range  Thought Process:  Linear  Orientation:  Full (Time, Place, and Person)  Thought Content:  Future oriented. No delusional theme. No preoccupation with violent thoughts. No negative ruminations. No obsession.  No hallucination in any modality.   Suicidal Thoughts:  No  Homicidal Thoughts:  No  Memory:  Immediate;   Good Recent;   Good Remote;   Good  Judgement:  Good  Insight:  Good  Psychomotor Activity:  Normal  Concentration:  Good  Recall:  Good  Fund of Knowledge:Good  Language: Good  Akathisia:  No  Handed:    AIMS (if indicated):     Assets:  Communication Skills Desire for Improvement Resilience  Sleep:  Number of Hours: 6.75  Cognition: WNL  ADL's:  Intact   Clinical Assessment::   56 yo AAF, single, homeless, unemployed, no income. Background history of SUD and mood disorder. Presented to the ER in company of the local police. Patient was intoxicated with alcohol at presentation. BAL was 308 mg/dl. Patient reports feeling worthless and helpless. No support from her family. Has a grown son who dose not keep in touch with her. Says she drinks and uses drugs on a daily basis. Transient auditory hallucinations. Has suicidal thoughts off and on. No current suicidal thought. Wants to get some help. Risky life style. Past suicidal behavior. No access to weapons.  Seen today. Pleased that she has been accepted into [facility]. Says she has been in good spirits. Not feeling down. Normal energy levels. Says she has been maintaining normal biological functions. No thoughts of suicide. No thoughts of homicide. No thoughts of violence. Says she is able to think clearly. No racing thoughts.  No evidence of psychosis. No evidence of mania. No overwhelming anxiety. No craving for substances.   Nursing staff reports that patient has been appropriate on the unit. Patient has been interacting well  with peers. No behavioral issues. Patient has not voiced any suicidal thoughts. Patient has not been observed to be internally stimulated. Patient has been adherent with treatment recommendations. Patient has been tolerating their medication well.  Patient was discussed at team. Team members feels that patient is back to her baseline level of function. Team agrees with plan to discharge patient today.   Demographic Factors:  Low socioeconomic status and Unemployed  Loss Factors: Financial problems/change in socioeconomic status  Historical Factors: Prior suicide attempts and Impulsivity  Risk Reduction Factors:   Positive social support, Positive therapeutic relationship and Positive coping skills or problem solving skills  Continued Clinical Symptoms:  As above  Cognitive Features That Contribute To Risk:  None    Suicide Risk:  Minimal: No identifiable suicidal ideation.  Patient is not having any thoughts of suicide at this time. Modifiable risk factors targeted during this admission includes depression and substance use. Demographical and historical risk factors cannot be modified. Patient is now engaging well. Patient is reliable and is future oriented. We have buffered patient's support structures. At this point, patient is at low risk of suicide. Patient is aware of the effects of psychoactive substances on decision making process. Patient has been provided with emergency contacts. Patient acknowledges to use resources provided if unforseen circumstances changes their current risk stratification.   Follow-up Information    [facility] Follow up on 11/07/2016.   Why:  You have been accepted for treatment. Please be ready by 12PM for an [facility] representative to pick you up and transport you to the facility. Please be sure that you also have a 21 day supply of meds. Contact information: [place], [place] [address] P: [phone] F: [phone]       [facility] Follow up.    Specialty:  Behavioral Health Why:  Please go for a walk-in appointment to be etablished for outpatient services after you finish treatment with [facility]. Walk-in hours are Mon-Fri 8AM-3PM. Please arrive as early as possible to be sure that you are seen. Contact information: [place] [address] [phone]           Plan Of Care/Follow-up recommendations:  1. Continue current psychotropic medications 2. Mental health and addiction follow up as arranged.    Artist Beach, MD 11/07/2016, 10:32 AM

## 2016-11-07 NOTE — Discharge Summary (Signed)
Physician Discharge Summary Note  Patient:  [name] is an 56 y.o., female MRN:  [id number] DOB:  05/10/1961 Patient phone:  [phone] (home)  Patient address:   [address] [place] [address],  Total Time spent with patient: Greater than 30 minutes  Date of Admission:  10/24/2016 Date of Discharge: 11-07-16  Reason for Admission: Alcohol intoxication.  Principal Problem: Alcohol-induced mood disorder with depressive symptoms [facility])  Discharge Diagnoses: Patient Active Problem List   Diagnosis Date Noted  . MDD (major depressive disorder), recurrent severe, without psychosis ([place]) [F33.2] 11/03/2016  . Alcohol-induced mood disorder with depressive symptoms ([place]) [F10.94] 10/26/2016  . Poor dentition [K08.9] 06/26/2016  . Essential hypertension [I10] 06/26/2016  . Hyperlipidemia [E78.5] 06/26/2016  . Elevated transaminase level [R74.0] 08/29/2015  . Health care maintenance [Z00.00] 12/31/2013  . Polysubstance abuse (h/o cocaine, ETOH, and tobacco abuse)  [F19.10]   . Primary vulvar squamous cell carcinoma s/p resection 2011 [C51.9] 02/13/2010  . 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 TOBACCO ABUSE [F17.200] 04/12/2006   Past Psychiatric History: Alcoholism, chronic, Substance induced mood disorder.  Past Medical History:  Past Medical History:  Diagnosis Date  . Cocaine abuse   . ETOH abuse   . Hypothyroidism   . Unspecified mood (affective) disorder [facility])     Past Surgical History:  Procedure Laterality Date  . radioactive iodine ablation     graves disease s/p   Family History: History reviewed. No pertinent family history.  Family Psychiatric  History: See H&P  Social History:  History  Alcohol Use No     History  Drug Use No    Comment: crack    Social History   Social History  . Marital status: Single    Spouse name: N/A  . Number of children: N/A  . Years of education: N/A   Social History Main Topics  .  Smoking status: Current Every Day Smoker    Packs/day: 0.30    Types: Cigarettes  . Smokeless tobacco: Never Used  . Alcohol use No  . Drug use: No     Comment: crack  . Sexual activity: Yes    Partners: Male    Birth control/ protection: None   Other Topics Concern  . None   Social History Narrative  . None   Hospital Course: 56 yo AAF, single,homeless, unemployed, no income. Background history of SUD and mood disorder. Presented to the ER in company of the local police. Patient was intoxicated with alcohol at presentation. BAL was 308 mg/dl. Patient reports feeling worthless and helpless. No support from her family. Has a grown son who dose not keep in touch with her. Says she drinks and uses drugs on a daily basis. Transient auditory hallucinations. Has suicidal thoughts off and on. No current suicidal thought. Wants to get some help. Risky life style. Past suicidal behavior. No access to weapons.  After evaluation her symptoms, [name]'s presenting symptoms were identified. The medication regimen for the presenting symptoms was discussed & initiated targeting those symptoms. She received brief Ativan detox protocols for alcohol detox. She was also medicated & discharged on; Risperdal 2 mg for mood control, Hydroxyzine 25 mg prn for anxiety Citalopram 20 mg for depression, Nicotine patch 21 mg for smoking cessation & Trazodone 200 mg for insomnia. She was also enrolled & participated in the group counseling sessions being offered & held on this unit. She learned coping skills. She presented  other significant health issues that required treatment &  or monitoring. She was resumed on all of her pertinent home medications for those health issues.  [name] tolerated her treatment regimen without any adverse effects or reactions reported.  [name] is seen today. Pleased that she has been accepted into the [facility] program for further substance abuse treatment. She says she has been in good spirits.  Not feeling down. Normal energy levels. Says she has been maintaining normal biological functions. No thoughts of suicide. No thoughts of homicide. No thoughts of violence. Says she is able to think clearly. No racing thoughts. No evidence of psychosis. No evidence of mania. No overwhelming anxiety. No craving for substances.   Nursing staff reports that patient has been appropriate on the unit. Patient has been interacting well with peers. No behavioral issues. Patient has not voiced any suicidal thoughts. Patient has not been observed to be internally stimulated. Patient has been adherent with treatment recommendations. Patient has been tolerating their medication well.  Patient was discussed at team. Team members feels that patient is back to her baseline level of function. Team agrees with plan to discharge patient today. She was presented with a 21 days worth supply of all [facility] discharge medications. She left [facility] in no apparent distress. Transportation per [employer].  Physical Findings: AIMS: Facial and Oral Movements Muscles of Facial Expression: None, normal Lips and Perioral Area: None, normal Jaw: None, normal Tongue: None, normal,Extremity Movements Upper (arms, wrists, hands, fingers): None, normal Lower (legs, knees, ankles, toes): None, normal, Trunk Movements Neck, shoulders, hips: None, normal, Overall Severity Severity of abnormal movements (highest score from questions above): None, normal Incapacitation due to abnormal movements: None, normal Patient's awareness of abnormal movements (rate only patient's report): No Awareness, Dental Status Current problems with teeth and/or dentures?: No Does patient usually wear dentures?: No  CIWA:  CIWA-Ar Total: 1 COWS:  COWS Total Score: 2  Musculoskeletal: Strength & Muscle Tone: within normal limits Gait & Station: normal Patient leans: N/A  Psychiatric Specialty Exam: Physical Exam  Constitutional: She is oriented to person,  place, and time. She appears well-developed.  HENT:  Head: Normocephalic.  Eyes: Pupils are equal, round, and reactive to light.  Neck: Normal range of motion.  Cardiovascular: Normal rate.   Respiratory: Effort normal.  GI: Soft.  Genitourinary:  Genitourinary Comments: Deferred  Musculoskeletal: Normal range of motion.  Neurological: She is alert and oriented to person, place, and time.  Skin: Skin is warm and dry.    Review of Systems  Constitutional: Negative.   HENT: Negative.   Eyes: Negative.   Respiratory: Negative.   Cardiovascular: Negative.   Gastrointestinal: Negative.   Genitourinary: Negative.   Musculoskeletal: Negative.   Skin: Negative.   Neurological: Negative.   Endo/Heme/Allergies: Negative.   Psychiatric/Behavioral: Positive for depression (Stable) and substance abuse (Hx. alcoholism, chronic). Negative for hallucinations, memory loss and suicidal ideas. The patient is nervous/anxious (Stable) and has insomnia (Stable).     Blood pressure 118/74, pulse 91, temperature 98 F (36.7 C), temperature source Oral, resp. rate 16, height 5\' 1"  (1.549 m), weight 68.7 kg (151 lb 8 oz), SpO2 97 %.Body mass index is 28.63 kg/m.  See Md's SRA   Have you used any form of tobacco in the last 30 days? (Cigarettes, Smokeless Tobacco, Cigars, and/or Pipes): Yes  Has this patient used any form of tobacco in the last 30 days? (Cigarettes, Smokeless Tobacco, Cigars, and/or Pipes): Yes, provided with a nicotine patch prescription for smoking cessation.  Blood Alcohol level:  Lab  Results  Component Value Date   ETH 208 (H) 10/24/2016   [facility]  12/25/2008    <5        LOWEST DETECTABLE LIMIT FOR SERUM ALCOHOL IS 5 mg/dL FOR MEDICAL PURPOSES ONLY   Metabolic Disorder Labs:  No results found for: HGBA1C, MPG No results found for: PROLACTIN Lab Results  Component Value Date   CHOL 191 09/12/2011   TRIG 64 09/12/2011   HDL 66 09/12/2011   CHOLHDL 2.9 09/12/2011   VLDL 13  09/12/2011   LDLCALC 112 (H) 09/12/2011   LDLCALC  12/26/2008    71        Total Cholesterol/HDL:CHD Risk Coronary Heart Disease Risk Table                     Men   Women  1/2 Average Risk   3.4   3.3  Average Risk       5.0   4.4  2 X Average Risk   9.6   7.1  3 X Average Risk  23.4   11.0        Use the calculated Patient Ratio above and the CHD Risk Table to determine the patient's CHD Risk.        ATP III CLASSIFICATION (LDL):  <100     mg/dL   Optimal  100-129  mg/dL   Near or Above                    Optimal  130-159  mg/dL   Borderline  160-189  mg/dL   High  >190     mg/dL   Very High   See Psychiatric Specialty Exam and Suicide Risk Assessment completed by Attending Physician prior to discharge.  Discharge destination:  [facility]  Is patient on multiple antipsychotic therapies at discharge:  No   Has Patient had three or more failed trials of antipsychotic monotherapy by history:  No  Recommended Plan for Multiple Antipsychotic Therapies: NA  Allergies as of 11/07/2016   No Known Allergies     Medication List    STOP taking these medications   escitalopram 20 MG tablet Commonly known as:  LEXAPRO   hydrOXYzine 50 MG capsule Commonly known as:  VISTARIL     TAKE these medications     Indication  citalopram 20 MG tablet Commonly known as:  CELEXA Take 1 tablet (20 mg total) by mouth daily. For depression Start taking on:  11/08/2016  Indication:  Depression   hydrOXYzine 25 MG tablet Commonly known as:  ATARAX/VISTARIL Take 1 tablet (25 mg total) by mouth every 6 (six) hours as needed for anxiety.  Indication:  Anxiety Neurosis   levothyroxine 75 MCG tablet Commonly known as:  SYNTHROID, LEVOTHROID Take 1 tablet (75 mcg total) by mouth daily at 6 (six) AM. For low functioning thyroid Start taking on:  11/08/2016 What changed:  when to take this  additional instructions  Indication:  Underactive Thyroid caused by Medical Treatment   nicotine 21  mg/24hr patch Commonly known as:  NICODERM CQ - dosed in mg/24 hours Place 1 patch (21 mg total) onto the skin daily. For smoking cessation Start taking on:  11/08/2016  Indication:  Nicotine Addiction   risperiDONE 2 MG tablet Commonly known as:  RISPERDAL Take 1 tablet (2 mg total) by mouth at bedtime. For mood control  Indication:  Mood control   traZODone 100 MG tablet Commonly known as:  DESYREL Take 2 tablets (  200 mg total) by mouth at bedtime. For sleep  Indication:  Trouble Sleeping      Follow-up Information    [facility] Follow up on 11/07/2016.   Why:  You have been accepted for treatment. Please be ready by 12PM for an [facility] representative to pick you up and transport you to the facility. Please be sure that you also have a 21 day supply of meds. Contact information: [place], [place] [address] P: [phone] F: [phone]       [facility] Follow up.   Specialty:  Behavioral Health Why:  Please go for a walk-in appointment to be etablished for outpatient services after you finish treatment with [facility]. Walk-in hours are Mon-Fri 8AM-3PM. Please arrive as early as possible to be sure that you are seen. Contact information: [place] [address] [phone]          Follow-up recommendations: Activity:  As tolerated Diet: As recommended by your primary care doctor. Keep all scheduled follow-up appointments as recommended.   Comments: Patient is instructed prior to discharge to: Take all medications as prescribed by his/her mental healthcare provider. Report any adverse effects and or reactions from the medicines to his/her outpatient provider promptly. Patient has been instructed & cautioned: To not engage in alcohol and or illegal drug use while on prescription medicines. In the event of worsening symptoms, patient is instructed to call the crisis hotline, 911 and or go to the nearest ED for appropriate evaluation and treatment of  symptoms. To follow-up with his/her primary care provider for your other medical issues, concerns and or health care needs.   Signed: Encarnacion Slates, NP, PMHNP, FNP-BC 11/07/2016, 11:55 AM

## 2016-11-07 NOTE — Plan of Care (Signed)
Problem: Education: Goal: Knowledge of the prescribed therapeutic regimen will improve Outcome: Progressing Patient actively participating in [place].  Problem: Safety: Goal: Ability to disclose and discuss suicidal ideas will improve Outcome: Progressing Patient denying SI.

## 2016-11-07 NOTE — Progress Notes (Signed)
  [facility] Adult Case Management Discharge Plan :  Will you be returning to the same living situation after discharge:  No. pt discharging to [facility] for treatment. At discharge, do you have transportation home?: Yes,  [facility] representative will provide transportation. Do you have the ability to pay for your medications: Yes,  prescriptions and samples provided.  Release of information consent forms completed and in the chart;  Patient's signature needed at discharge.  Patient to Follow up at: Follow-up Information    [facility] Follow up on 11/07/2016.   Why:  You have been accepted for treatment. Please be ready by 12PM for an [facility] representative to pick you up and transport you to the facility. Please be sure that you also have a 21 day supply of meds. Contact information: [place], [place] [address] P: [phone] F: [phone]       [facility] Follow up.   Specialty:  Behavioral Health Why:  Please go for a walk-in appointment to be etablished for outpatient services after you finish treatment with [facility]. Walk-in hours are Mon-Fri 8AM-3PM. Please arrive as early as possible to be sure that you are seen. Contact information: [place]  [address] [phone]           Next level of care provider has access to [place] and Suicide Prevention discussed: Yes,  with pt.  Have you used any form of tobacco in the last 30 days? (Cigarettes, Smokeless Tobacco, Cigars, and/or Pipes): Yes  Has patient been referred to the Quitline?: Patient refused referral  Patient has been referred for addiction treatment: Yes  Georga Kaufmann, MSW, LCSWA  11/07/2016, 10:31 AM

## 2016-11-07 NOTE — Progress Notes (Addendum)
D: Patient up and visible in the milieu. Spoke with patient 1:1. Rating depression at a 5/10 and anxiety at a 3/10. Rates sleep as fair, appetite as fair, energy as high and concentration as poor. Patient's affect animated, mood pleasant, somewhat anxious. Patient loud and intrusive at times. States goal for today is to "stay clean and to go to rehab." Denies pain, physical problems.   A: Medicated per orders, no prns needed, requested. Emotional support offered and self inventory reviewed. Encouraged completion of Suicide Safety Plan. Discussed POC with MD, SW.   R: Patient verbalizes understanding of POC. Patient endorsing some passive SI however patient states, "they are always there." Denies plan, intent. Verbally contracts for safety. "I'm just anxious." No HI and remains safe on level III obs. Awaiting [facility] bed.

## 2016-11-07 NOTE — Progress Notes (Signed)
Recreation Therapy Notes  Date: 11/07/16 Time: 0930 Location: 400 Hall Dayroom  Group Topic: Stress Management  Goal Area(s) Addresses:  Patient will verbalize importance of using healthy stress management.  Patient will identify positive emotions associated with healthy stress management.   Intervention: Stress Management  Activity :  Guided Imagery.  LRT introduced the stress management technique of guided imagery.  LRT read a script to allow patients to engage in the technique.  Patients were to follow along as the script was read to participate.  Education:  Stress Management, Discharge Planning.   Education Outcome: Acknowledges edcuation/In group clarification offered/Needs additional education  Clinical Observations/Feedback: Pt did not attend group.   Victorino Sparrow, LRT/CTRS         Ria Comment, Malay Fantroy A 11/07/2016 1:01 PM

## 2016-11-07 NOTE — Tx Team (Signed)
Interdisciplinary Treatment and Diagnostic Plan Update 11/07/2016 Time of Session: 9:30am  [name]  MRN: [id number]  Principal Diagnosis: Alcohol-induced mood disorder with depressive symptoms ([place])  Secondary Diagnoses: Principal Problem:   Alcohol-induced mood disorder with depressive symptoms ([place]) Active Problems:   MDD (major depressive disorder), recurrent severe, without psychosis ([place])   Current Medications:  Current Facility-Administered Medications  Medication Dose Route Frequency Provider Last Rate Last Dose  . acamprosate (CAMPRAL) tablet 666 mg  666 mg Oral TID WC Cobos, Myer Peer, MD   666 mg at 11/07/16 0547  . acetaminophen (TYLENOL) tablet 650 mg  650 mg Oral Q6H PRN Ethelene Hal, NP      . alum & mag hydroxide-simeth (MAALOX/MYLANTA) 200-200-20 MG/5ML suspension 30 mL  30 mL Oral Q4H PRN Ethelene Hal, NP      . citalopram (CELEXA) tablet 20 mg  20 mg Oral Daily Ethelene Hal, NP   20 mg at 11/07/16 0801  . hydrOXYzine (ATARAX/VISTARIL) tablet 25 mg  25 mg Oral Q6H PRN Ethelene Hal, NP   25 mg at 10/30/16 2319  . levothyroxine (SYNTHROID, LEVOTHROID) tablet 75 mcg  75 mcg Oral Q0600 Cobos, Myer Peer, MD   75 mcg at 11/07/16 0547  . magnesium hydroxide (MILK OF MAGNESIA) suspension 30 mL  30 mL Oral Daily PRN Ethelene Hal, NP      . multivitamin with minerals tablet 1 tablet  1 tablet Oral Daily Laverle Hobby, PA-C   1 tablet at 11/07/16 0801  . nicotine (NICODERM CQ - dosed in mg/24 hours) patch 21 mg  21 mg Transdermal Daily Cobos, Myer Peer, MD   21 mg at 11/07/16 0801  . risperiDONE (RISPERDAL) tablet 2 mg  2 mg Oral QHS Cobos, Myer Peer, MD   2 mg at 11/06/16 2047  . thiamine (VITAMIN B-1) tablet 100 mg  100 mg Oral Daily Patriciaann Clan E, PA-C   100 mg at 11/07/16 0801  . traZODone (DESYREL) tablet 200 mg  200 mg Oral QHS Withrow, [name], FNP   200 mg at 11/06/16 2047    PTA Medications: Prescriptions  Prior to Admission  Medication Sig Dispense Refill Last Dose  . escitalopram (LEXAPRO) 20 MG tablet Take 20 mg by mouth daily.   10/22/2016  . hydrOXYzine (VISTARIL) 50 MG capsule Take 50 mg by mouth 3 (three) times daily as needed for anxiety.   10/22/2016  . levothyroxine (SYNTHROID, LEVOTHROID) 75 MCG tablet Take 1 tablet (75 mcg total) by mouth daily. 90 tablet 1 10/22/2016    Treatment Modalities: Medication Management, Group therapy, Case management,  1 to 1 session with clinician, Psychoeducation, Recreational therapy.  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[place]) Long Term Goal(s): Improvement in symptoms so as ready for discharge Short Term Goals: Ability to identify changes in lifestyle to reduce recurrence of condition will improve Ability to verbalize feelings will improve Ability to disclose and discuss suicidal ideas Ability to maintain clinical measurements within normal limits will improve Compliance with prescribed medications will improve Ability to identify triggers associated with substance abuse/mental health issues will improve  Medication Management: Evaluate patient's response, side effects, and tolerance of medication regimen.  Therapeutic Interventions: 1 to 1 sessions, Unit Group sessions and Medication administration.  Evaluation of Outcomes: Adequate for Discharge  Physician Treatment  Plan for Secondary Diagnosis: Principal Problem:   Alcohol-induced mood disorder with depressive symptoms ([facility]) Active Problems:   MDD (major depressive disorder), recurrent severe, without psychosis ([place])  Long Term Goal(s): Improvement in symptoms so as ready for discharge  Short Term Goals: Ability to  identify changes in lifestyle to reduce recurrence of condition will improve Ability to verbalize feelings will improve Ability to disclose and discuss suicidal ideas Ability to maintain clinical measurements within normal limits will improve Compliance with prescribed medications will improve Ability to identify triggers associated with substance abuse/mental health issues will improve  Medication Management: Evaluate patient's response, side effects, and tolerance of medication regimen.  Therapeutic Interventions: 1 to 1 sessions, Unit Group sessions and Medication administration.  Evaluation of Outcomes: Adequate for Discharge  RN Treatment Plan for Primary Diagnosis: Alcohol-induced mood disorder with depressive symptoms ([place]) Long Term Goal(s): Knowledge of disease and therapeutic regimen to maintain health will improve  Short Term Goals: Ability to remain free from injury will improve and Compliance with prescribed medications will improve  Medication Management: RN will administer medications as ordered by provider, will assess and evaluate patient's response and provide education to patient for prescribed medication. RN will report any adverse and/or side effects to prescribing provider.  Therapeutic Interventions: 1 on 1 counseling sessions, Psychoeducation, Medication administration, Evaluate responses to treatment, Monitor vital signs and CBGs as ordered, Perform/monitor CIWA, COWS, AIMS and Fall Risk screenings as ordered, Perform wound care treatments as ordered.  Evaluation of Outcomes: Adequate for Discharge  LCSW Treatment Plan for Primary Diagnosis: Alcohol-induced mood disorder with depressive symptoms ([place]) Long Term Goal(s): Safe transition to appropriate next level of care at discharge, Engage patient in therapeutic group addressing interpersonal concerns. Short Term Goals: Engage patient in aftercare planning with referrals and resources, Facilitate patient progression  through stages of change regarding substance use diagnoses and concerns, Identify triggers associated with mental health/substance abuse issues and Increase skills for wellness and recovery  Therapeutic Interventions: Assess for all discharge needs, 1 to 1 time with Social worker, Explore available resources and support systems, Assess for adequacy in community support network, Educate family and significant other(s) on suicide prevention, Complete Psychosocial Assessment, Interpersonal group therapy.  Evaluation of Outcomes: Adequate for Discharge  Progress in Treatment: Attending groups: Yes  Participating in groups: Yes Taking medication as prescribed: Yes, MD continues to assess for medication changes as needed Toleration medication: Yes, no side effects reported at this time Family/Significant other contact made: No, pt declined contact. Patient understands diagnosis: Yes, AEB pt's willingness to participate in treatment. Discussing patient identified problems/goals with staff: Yes Medical problems stabilized or resolved: Yes Denies suicidal/homicidal ideation: Yes Issues/concerns per patient self-inventory: None Other: N/A  New problem(s) identified: None identified at this time.   New Short Term/Long Term Goal(s): None identified at this time.   Discharge Plan or Barriers: Pt will return home and follow-up inpatient with [facility] or [facility]. [facility] referral sent 10/30/16, message left for [facility] 10/30/16.  11/07/16: Pt will discharge to [facility] for treatment.  Reason for Continuation of Hospitalization:  None identified at this time.  Estimated Length of Stay: 0 days  Attendees: Patient: 11/07/2016 10:27 AM  Physician: Dr. Parke Poisson 11/07/2016 10:27 AM  Nursing: Jinny Sanders RN; [place], RN 11/07/2016 10:27 AM  RN Care Manager: Lars Pinks, RN 11/07/2016 10:27 AM  Social Worker: Matthew Saras, [place] 11/07/2016 10:27 AM  Recreational Therapist:  11/07/2016 10:27 AM  Other: Lindell Spar, NP; Samuel Jester,  NP 11/07/2016 10:27 AM  Other:  11/07/2016 10:27 AM  Other: 11/07/2016 10:27 AM   Scribe for Treatment Team: Georga Kaufmann, MSW,LCSWA 11/07/2016 10:27 AM

## 2016-11-07 NOTE — Progress Notes (Signed)
Patient verbalizes readiness for discharge. Follow up plan explained, AVS, transition record and SRA given along with prescriptions. Sample meds provided. All belongings returned. Patient verbalizes understanding. Denies SI/HI and assures this Probation officer she will seek assistance should that change. Patient discharged ambulatory and in stable condition to [facility] rep. All materials and medications given directly to representative.

## 2016-11-07 NOTE — Progress Notes (Signed)
D. Jatasia had been up and visible in milieu this evening, did attend and participate in evening group activity. She spoke about how she is waiting to get into a treatment facility. Gusta was seen interacting appropriately with peers in milieu, received all medications without incident and did not verbalize any complaints of pain. A. Support and encouragement provided. R. Safety maintained, will continue to monitor.

## 2016-11-26 ENCOUNTER — Emergency Department (HOSPITAL_COMMUNITY)
Admission: EM | Admit: 2016-11-26 | Discharge: 2016-11-27 | Disposition: A | Discharge status: Home / Self Care | Payer: Medicaid Other | Attending: Emergency Medicine | Admitting: Emergency Medicine

## 2016-11-26 ENCOUNTER — Ambulatory Visit (HOSPITAL_COMMUNITY)
Admission: AD | Admit: 2016-11-26 | Discharge: 2016-11-26 | Disposition: A | Discharge status: Home / Self Care | Payer: Medicaid Other | Attending: Psychiatry | Admitting: Psychiatry

## 2016-11-26 ENCOUNTER — Encounter (HOSPITAL_COMMUNITY): Payer: Self-pay | Admitting: Family Medicine

## 2016-11-26 DIAGNOSIS — Z5900 Homelessness unspecified: Secondary | ICD-10-CM

## 2016-11-26 DIAGNOSIS — R45851 Suicidal ideations: Secondary | ICD-10-CM | POA: Diagnosis present

## 2016-11-26 DIAGNOSIS — F332 Major depressive disorder, recurrent severe without psychotic features: Secondary | ICD-10-CM | POA: Insufficient documentation

## 2016-11-26 DIAGNOSIS — F331 Major depressive disorder, recurrent, moderate: Secondary | ICD-10-CM | POA: Diagnosis not present

## 2016-11-26 DIAGNOSIS — Z79899 Other long term (current) drug therapy: Secondary | ICD-10-CM | POA: Insufficient documentation

## 2016-11-26 LAB — CBC
HCT: 37.4 % (ref 36.0–46.0)
Hemoglobin: 12.4 g/dL (ref 12.0–15.0)
MCH: 27.9 pg (ref 26.0–34.0)
MCHC: 33.2 g/dL (ref 30.0–36.0)
MCV: 84.2 fL (ref 78.0–100.0)
Platelets: 264 10*3/uL (ref 150–400)
RBC: 4.44 MIL/uL (ref 3.87–5.11)
RDW: 13.3 % (ref 11.5–15.5)
WBC: 7 10*3/uL (ref 4.0–10.5)

## 2016-11-26 LAB — COMPREHENSIVE METABOLIC PANEL
ALT: 47 U/L (ref 14–54)
AST: 42 U/L — AB (ref 15–41)
Albumin: 4.2 g/dL (ref 3.5–5.0)
Alkaline Phosphatase: 69 U/L (ref 38–126)
Anion gap: 8 (ref 5–15)
BILIRUBIN TOTAL: 0.4 mg/dL (ref 0.3–1.2)
BUN: 15 mg/dL (ref 6–20)
CO2: 27 mmol/L (ref 22–32)
CREATININE: 0.7 mg/dL (ref 0.44–1.00)
Calcium: 9.3 mg/dL (ref 8.9–10.3)
Chloride: 107 mmol/L (ref 101–111)
GFR calc Af Amer: >60 mL/min (ref >60)
Glucose, Bld: 121 mg/dL — ABNORMAL HIGH (ref 65–99)
Potassium: 3.4 mmol/L — ABNORMAL LOW (ref 3.5–5.1)
Sodium: 142 mmol/L (ref 135–145)
TOTAL PROTEIN: 7.3 g/dL (ref 6.5–8.1)

## 2016-11-26 LAB — RAPID URINE DRUG SCREEN, HOSP PERFORMED
Amphetamines: NOT DETECTED
BENZODIAZEPINES: NOT DETECTED
Barbiturates: NOT DETECTED
Cocaine: NOT DETECTED
Opiates: NOT DETECTED
Tetrahydrocannabinol: NOT DETECTED

## 2016-11-26 LAB — ETHANOL

## 2016-11-26 LAB — ACETAMINOPHEN LEVEL

## 2016-11-26 LAB — SALICYLATE LEVEL

## 2016-11-26 MED ORDER — NICOTINE 21 MG/24HR TD PT24
21.0000 mg | MEDICATED_PATCH | Freq: Every day | TRANSDERMAL | Status: DC
  Administered 2016-11-27: 21 mg via TRANSDERMAL
  Filled 2016-11-26: qty 1

## 2016-11-26 MED ORDER — RISPERIDONE 2 MG PO TABS
2.0000 mg | ORAL_TABLET | Freq: Every day | ORAL | Status: DC
  Administered 2016-11-26: 2 mg via ORAL
  Filled 2016-11-26: qty 1

## 2016-11-26 MED ORDER — CITALOPRAM HYDROBROMIDE 10 MG PO TABS
20.0000 mg | ORAL_TABLET | Freq: Every day | ORAL | Status: DC
  Administered 2016-11-27: 20 mg via ORAL
  Filled 2016-11-26: qty 2

## 2016-11-26 MED ORDER — TRAZODONE HCL 100 MG PO TABS
200.0000 mg | ORAL_TABLET | Freq: Every day | ORAL | Status: DC
  Administered 2016-11-26: 200 mg via ORAL
  Filled 2016-11-26: qty 2

## 2016-11-26 MED ORDER — LEVOTHYROXINE SODIUM 75 MCG PO TABS
75.0000 ug | ORAL_TABLET | Freq: Every day | ORAL | Status: DC
  Administered 2016-11-27: 75 ug via ORAL
  Filled 2016-11-26: qty 1

## 2016-11-26 MED ORDER — ONDANSETRON HCL 4 MG PO TABS: 4.0000 mg | ORAL_TABLET | Freq: Three times a day (TID) | ORAL | Status: DC | PRN

## 2016-11-26 MED ORDER — ALUM & MAG HYDROXIDE-SIMETH 200-200-20 MG/5ML PO SUSP: 30.0000 mL | Freq: Four times a day (QID) | ORAL | Status: DC | PRN

## 2016-11-26 MED ORDER — HYDROXYZINE HCL 25 MG PO TABS: 25.0000 mg | ORAL_TABLET | Freq: Four times a day (QID) | ORAL | Status: DC | PRN

## 2016-11-26 MED ORDER — IBUPROFEN 200 MG PO TABS: 600.0000 mg | ORAL_TABLET | Freq: Three times a day (TID) | ORAL | Status: DC | PRN

## 2016-11-26 MED ORDER — ACETAMINOPHEN 325 MG PO TABS: 650.0000 mg | ORAL_TABLET | ORAL | Status: DC | PRN

## 2016-11-26 NOTE — BH Assessment (Signed)
Tele Assessment Note  Pt presents voluntarily to [facility] for assessment. She is cooperative and oriented x 4. Per chart review, pt was admittted to [facility] in late April 2018 and d/c 11/07/16. Pt says, "I want to kill myself to real."  Pt endorses SI with plan to overdose on heroin. She says that she completed [facility] 14 day program after leaving [facility] and then she moved in with a roommate. Pt says that her boyfriend wasn't able to provide pt with rent money, so her roommate kicked her out today. Pt says that her mood had been good until today. She now endorses fatigue and worthlessness. She endorses moderate anxiety. She reports she hasn't used crack cocaine or etoh since before pt was admitted to [place] in April. Pt states she has been taking her meds as prescribed. She says she has an appt with [facility] 6/11 for med management/talk therapy. She reports short term memory impairment and decreased concentration. Pt denies homicidal thoughts or physical aggression. Pt denies having access to firearms. Pt denies having any legal problems at this time. She reports her mom and paternal uncle had MDD. She sts her mom went to mental hospital's when pt was young. Pt denies HI. She denies [facility] and no delusions noted.   Missi L Lund is an 56 y.o. female.   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 Cocaine abuse   . ETOH abuse   . Hypothyroidism   . Unspecified mood (affective) disorder [facility])     Past Surgical History:  Procedure Laterality Date  . radioactive iodine ablation     graves disease s/p    Family History: No family history on file.  Social History:  reports that she has been smoking Cigarettes.  She has been smoking about 0.30 packs per day. She has never used smokeless tobacco. She reports that she does  not drink alcohol or use drugs.  Additional Social History:  Alcohol / Drug Use Pain Medications: pt denies abuse - see pta meds list Prescriptions: pt denies abuse - see pta meds list Over the Counter: pt denies abuse - see pta meds lsit History of alcohol / drug use?: Yes Longest period of sobriety (when/how long): pt has used since 10/23/16 Negative Consequences of Use: Financial Substance #1 Name of Substance 1: etoh 1 - Age of First Use: 17 1 - Amount (size/oz): three to four 40 oz  1 - Frequency: daily 1 - Duration: 39 years 1 - Last Use / Amount: 10/23/16 Substance #2 Name of Substance 2: crack cocaine 2 - Age of First Use: 41ish 2 - Amount (size/oz): $40 2 - Frequency: daily 2 - Duration: 10+ years 2 - Last Use / Amount: 10/27/16  CIWA: CIWA-Ar BP: 129/62 Pulse Rate: 85 COWS:    PATIENT STRENGTHS: (choose at least two) Average or above average intelligence Communication skills Motivation for treatment/growth  Allergies: No Known Allergies  Home Medications:  (Not in a hospital admission)  OB/GYN Status:  No LMP recorded. Patient is postmenopausal.  General Assessment Data Location of Assessment: [facility] TTS Assessment: In system Is this a Tele or Face-to-Face Assessment?: Face-to-Face Is this an Initial Assessment or a Re-assessment for this encounter?: Initial Assessment Marital status: Single (has boyfriend) Elwin Sleight name: none Is patient pregnant?: No Pregnancy Status: No Living Arrangements: Other (Comment) (kicked out  of roommate's apt b/c no rent $) Can pt return to current living arrangement?: No Admission Status: Voluntary Is patient capable of signing voluntary admission?: Yes Referral Source: Self/Family/Friend Insurance type: self pay  Medical Screening Exam ([place]) Medical Exam completed: Yes  Crisis Care Plan Living Arrangements: Other (Comment) (kicked out of roommate's apt b/c no rent $) Name of Psychiatrist:  Beverly Sessions Name of Therapist: [facility]   Education Status Is patient currently in school?: No Highest grade of school patient has completed: 11  Risk to self with the past 6 months Suicidal Ideation: Yes-Currently Present Has patient been a risk to self within the past 6 months prior to admission? : No Suicidal Intent: Yes-Currently Present Has patient had any suicidal intent within the past 6 months prior to admission? : No Is patient at risk for suicide?: Yes Suicidal Plan?: Yes-Currently Present Has patient had any suicidal plan within the past 6 months prior to admission? : No Specify Current Suicidal Plan: overdose on heroin or drink herself to death Access to Means: Yes Specify Access to Suicidal Means: access to substances What has been your use of drugs/alcohol within the last 12 months?: daily etoh & crack until 10/27/16 Previous Attempts/Gestures: No How many times?: 0 Other Self Harm Risks: none Triggers for Past Attempts:  (n/a) Intentional Self Injurious Behavior: None Family Suicide History: No Recent stressful life event(s): Turmoil (Comment) (kicked out of apt b/c didn't have rent $) Persecutory voices/beliefs?: No Depression: Yes Depression Symptoms: Feeling worthless/self pity, Fatigue Substance abuse history and/or treatment for substance abuse?: Yes Suicide prevention information given to non-admitted patients: Not applicable  Risk to Others within the past 6 months Homicidal Ideation: No Does patient have any lifetime risk of violence toward others beyond the six months prior to admission? : No Thoughts of Harm to Others: No Current Homicidal Intent: No Current Homicidal Plan: No Access to Homicidal Means: No Describe Access to Homicidal Means: n/a Identified Victim: none History of harm to others?: No Assessment of Violence: None Noted Violent Behavior Description: pt denies hx violence Does patient have access to weapons?: No Criminal Charges Pending?:  No Does patient have a court date: No Is patient on probation?: No  Psychosis Hallucinations: None noted Delusions: None noted  Mental Status Report Appearance/Hygiene:  (in appropriate street clothing) Eye Contact: Good Motor Activity: Freedom of movement, Restlessness Speech: Logical/coherent Level of Consciousness: Alert Mood: Depressed, Sad, Anxious Affect: Appropriate to circumstance Anxiety Level: Moderate Thought Processes: Coherent, Relevant Judgement: Unimpaired Orientation: Person, Place, Time, Situation Obsessive Compulsive Thoughts/Behaviors: None  Cognitive Functioning Concentration: Decreased Memory: Recent Impaired, Remote Intact IQ: Average Insight: Good Impulse Control: Good Appetite: Fair Sleep: No Change (pt states trazodone has helped) Vegetative Symptoms: None  ADLScreening [facility] Assessment Services) Patient able to express need for assistance with ADLs?: Yes Independently performs ADLs?: Yes (appropriate for developmental age)  Prior Inpatient Therapy Prior Inpatient Therapy: Yes Prior Therapy Dates: [facility] Prior Therapy Facilty/Provider(s): April-May 2018 Reason for Treatment: MDD, SA  Prior Outpatient Therapy Prior Outpatient Therapy: Yes Prior Therapy Dates: currently Prior Therapy Facilty/Provider(s): [facility] Reason for Treatment: MDD and med management Does patient have an ACCT team?: No Does patient have Intensive In-House Services?  : No Does patient have [facility] services? : Yes Does patient have P4CC services?: Unknown  ADL Screening (condition at time of admission) Is the patient deaf or have difficulty hearing?: No Does the patient have difficulty seeing, even when wearing glasses/contacts?: No Does the patient have difficulty concentrating,  remembering, or making decisions?: Yes Patient able to express need for assistance with ADLs?: Yes Does the patient have difficulty dressing or bathing?: No Independently performs ADLs?:  Yes (appropriate for developmental age) Does the patient have difficulty walking or climbing stairs?: No Weakness of Legs: None Weakness of Arms/Hands: None  Home Assistive Devices/Equipment Home Assistive Devices/Equipment: None    Abuse/Neglect Assessment (Assessment to be complete while patient is alone) Physical Abuse: Denies Verbal Abuse: Denies Sexual Abuse: Denies Exploitation of patient/patient's resources: Denies     Regulatory affairs officer (For Healthcare) Does Patient Have a Medical Advance Directive?: No Would patient like information on creating a medical advance directive?: No - Patient declined    Additional Information 1:1 In Past 12 Months?: No CIRT Risk: No Elopement Risk: No Does patient have medical clearance?: No     Disposition:  Disposition Initial Assessment Completed for this Encounter: Yes Disposition of Patient: Inpatient treatment program Type of inpatient treatment program: Adult Zerita Boers NP recommends inpatient)   Lu Duffel NP recommends inpatient treatment for pt. Currently, there are no appropriate beds at [facility]. PT will be sent to [facility] for medical clearance and transported by [facility]. Writer notified [facility] Charge RN Lattie Haw.   Abreanna Drawdy, [place] 11/26/2016 6:56 PM

## 2016-11-26 NOTE — ED Notes (Signed)
Patient educated about search process and term "contraband " and routine search performed. No contraband found. 

## 2016-11-26 NOTE — ED Triage Notes (Signed)
Patient was transported by Betsy Pries from [facility]. Pt is suicidal with a plan to overdose on crack or drink ETOH. Pt reports she is homeless. Pt has been assessed at [facility], awaiting on medical clearance and assigned bed for [facility]. Pt is calm and cooperative.

## 2016-11-26 NOTE — ED Provider Notes (Signed)
[place] DEPT Provider Note   CSN: [id number] Arrival date & time: 11/26/16  1919     History   Chief Complaint Chief Complaint  Patient presents with  . Suicidal    HPI [name] is a 56 y.o. female.  She presents for evaluation of suicidal ideation, which started as she was arguing with someone today.  She was being put out of the home she was then, because she could not pay any rent.  She states this made her angry, and she began feeling suicidal.  She has now homeless.  She is also hearing voices that tell her to kill herself.  She was recently hospitalized, in a psychiatric facility.  She went to the [facility] outpatient intake area, today prior to coming to the emergency department, for medical clearance.  The providers there felt like she needed inpatient psychiatric hospitalization.  She denies using alcohol or illegal drugs.  There are no other known modifying factors.  HPI  Past Medical History:  Diagnosis Date  . Cocaine abuse   . ETOH abuse   . Hypothyroidism   . Unspecified mood (affective) disorder [facility])     Patient Active Problem List   Diagnosis Date Noted  . MDD (major depressive disorder), recurrent severe, without psychosis ([place]) 11/03/2016  . Alcohol-induced mood disorder with depressive symptoms ([place]) 10/26/2016  . Poor dentition 06/26/2016  . Essential hypertension 06/26/2016  . Hyperlipidemia 06/26/2016  . Elevated transaminase level 08/29/2015  . Health care maintenance 12/31/2013  . Polysubstance abuse (h/o cocaine, ETOH, and tobacco abuse)    . Primary vulvar squamous cell carcinoma s/p resection 2011 02/13/2010  . 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 TOBACCO ABUSE 04/12/2006    Past Surgical History:  Procedure Laterality Date  . radioactive iodine ablation     graves disease s/p    OB History    Gravida Para Term Preterm AB Living   1 1 1     1    SAB TAB Ectopic Multiple Live Births                    Home Medications    Prior to Admission medications   Medication Sig Start Date End Date Taking? Authorizing Provider  citalopram (CELEXA) 20 MG tablet Take 1 tablet (20 mg total) by mouth daily. For depression 11/08/16  Yes Lindell Spar I, NP  hydrOXYzine (ATARAX/VISTARIL) 25 MG tablet Take 1 tablet (25 mg total) by mouth every 6 (six) hours as needed for anxiety. 11/07/16  Yes Lindell Spar I, NP  levothyroxine (SYNTHROID, LEVOTHROID) 75 MCG tablet Take 1 tablet (75 mcg total) by mouth daily at 6 (six) AM. For low functioning thyroid 11/08/16  Yes Nwoko, Herbert Pun I, NP  nicotine (NICODERM CQ - DOSED IN MG/24 HOURS) 21 mg/24hr patch Place 1 patch (21 mg total) onto the skin daily. For smoking cessation 11/08/16  Yes Lindell Spar I, NP  risperiDONE (RISPERDAL) 2 MG tablet Take 1 tablet (2 mg total) by mouth at bedtime. For mood control 11/07/16  Yes Lindell Spar I, NP  traZODone (DESYREL) 100 MG tablet Take 2 tablets (200 mg total) by mouth at bedtime. For sleep 11/07/16  Yes Encarnacion Slates, NP    Family History History reviewed. No pertinent family history.  Social History Social History  Substance Use Topics  . Smoking status: Current Every Day Smoker    Packs/day: 0.30    Types: Cigarettes  . Smokeless tobacco: Never Used  . Alcohol use  No     Comment: Last drink: In the last month.      Allergies   Patient has no known allergies.   Review of Systems Review of Systems  All other systems reviewed and are negative.    Physical Exam Updated Vital Signs BP 120/66 (BP Location: Left Arm)   Pulse 73   Temp 98.2 F (36.8 C) (Oral)   Resp 16   Ht 5\' 1"  (1.549 m)   Wt 75 kg (165 lb 6 oz)   SpO2 96%   BMI 31.25 kg/m   Physical Exam  Constitutional: She is oriented to person, place, and time. She appears well-developed. No distress.  She appears older than stated age.  HENT:  Head: Normocephalic and atraumatic.  Eyes: Conjunctivae and EOM are normal. Pupils are equal, round,  and reactive to light.  Neck: Normal range of motion and phonation normal. Neck supple.  Cardiovascular: Normal rate and regular rhythm.   Pulmonary/Chest: Effort normal and breath sounds normal. She exhibits no tenderness.  Abdominal: Soft. She exhibits no distension. There is no tenderness. There is no guarding.  Musculoskeletal: Normal range of motion.  Neurological: She is alert and oriented to person, place, and time. She exhibits normal muscle tone.  No dysarthria or aphasia.  Skin: Skin is warm and dry.  Psychiatric: She has a normal mood and affect. Her behavior is normal. Judgment and thought content normal.  Nursing note and vitals reviewed.    ED Treatments / Results  Labs (all labs ordered are listed, but only abnormal results are displayed) Labs Reviewed  COMPREHENSIVE METABOLIC PANEL - Abnormal; Notable for the following:       Result Value   Potassium 3.4 (*)    Glucose, Bld 121 (*)    AST 42 (*)    All other components within normal limits  ACETAMINOPHEN LEVEL - Abnormal; Notable for the following:    Acetaminophen (Tylenol), Serum <10 (*)    All other components within normal limits  ETHANOL  SALICYLATE LEVEL  CBC  RAPID URINE DRUG SCREEN, HOSP PERFORMED    EKG  EKG Interpretation None       Radiology No results found.  Procedures Procedures (including critical care time)  Medications Ordered in ED Medications  nicotine (NICODERM CQ - dosed in mg/24 hours) patch 21 mg (not administered)  citalopram (CELEXA) tablet 20 mg (not administered)  hydrOXYzine (ATARAX/VISTARIL) tablet 25 mg (not administered)  levothyroxine (SYNTHROID, LEVOTHROID) tablet 75 mcg (not administered)  risperiDONE (RISPERDAL) tablet 2 mg (not administered)  traZODone (DESYREL) tablet 200 mg (not administered)  acetaminophen (TYLENOL) tablet 650 mg (not administered)  alum & mag hydroxide-simeth (MAALOX/MYLANTA) 200-200-20 MG/5ML suspension 30 mL (not administered)  ibuprofen  (ADVIL,MOTRIN) tablet 600 mg (not administered)  ondansetron (ZOFRAN) tablet 4 mg (not administered)     Initial Impression / Assessment and Plan / ED Course  I have reviewed the triage vital signs and the nursing notes.  Pertinent labs & imaging results that were available during my care of the patient were reviewed by me and considered in my medical decision making (see chart for details).  Clinical Course as of Nov 27 2219  Mon Nov 26, 2016  2219 She is medically cleared for treatment by psychiatry.  [EW]    Clinical Course User Index [EW] Daleen Bo, MD     Patient Vitals for the past 24 hrs:  BP Temp Temp src Pulse Resp SpO2 Height Weight  11/26/16 2218 120/66 98.2 F (36.8  C) Oral 73 16 96 % - -  11/26/16 1940 - - - - - - 5\' 1"  (1.549 m) 75 kg (165 lb 6 oz)  11/26/16 1928 (!) 154/51 97.9 F (36.6 C) Oral 92 18 97 % - -    TTS Consult   Final Clinical Impressions(s) / ED Diagnoses   Final diagnoses:  Suicidal ideation  Homelessness   Suicidal ideation with preceding psychiatric illness.  Patient also homelessness which is likely drifting to her distress.  Nursing Notes Reviewed/ Care Coordinated Applicable Imaging Reviewed Interpretation of Laboratory Data incorporated into ED treatment  Plan-as per TTS in conjunction with oncoming provider team  New Prescriptions New Prescriptions   No medications on file     Daleen Bo, MD 11/26/16 2221

## 2016-11-26 NOTE — ED Notes (Signed)
Patient admits to [facility] with a plan OD on crack or etoh. Patient denies HI and AVH at this time. Encouragement and support provided and safety maintain. Q 15 min safety checks in place and video monitoring.

## 2016-11-26 NOTE — H&P (Signed)
Behavioral Health Medical Screening Exam  [name] is an 56 y.o. female who arrived as a walk-in to [facility] with complaints of depression and suicide ideations.   Per the assessment completed by [name], "Pt presents voluntarily to [facility] for assessment. She is cooperative and oriented x 4. Per chart review, pt was admittted to [facility] in late April 2018 and d/c 11/07/16. Pt says, "I want to kill myself to real."  Pt endorses SI with plan to overdose on heroin. She says that she completed [facility] 14 day program after leaving [facility] and then she moved in with a roommate. Pt says that her boyfriend wasn't able to provide pt with rent money, so her roommate kicked her out today. Pt says that her mood had been good until today. She now endorses fatigue and worthlessness. She endorses moderate anxiety. She reports she hasn't used crack cocaine or etoh since before pt was admitted to [place] in April. Pt states she has been taking her meds as prescribed. She says she has an appt with [facility] 6/11 for med management/talk therapy. She reports short term memory impairment and decreased concentration. Pt denies homicidal thoughts or physical aggression. Pt denies having access to firearms. Pt denies having any legal problems at this time. She reports her mom and paternal uncle had MDD. She sts her mom went to mental hospital's when pt was young. Pt denies HI. She denies [facility] and no delusions noted".   On my assessment: Pt presents in a depressed mood and affect and reiterated the above statements and reasons for presenting to the hospital. Pt reported feeling bad because she has been working on herself to stay clean from drugs. Patient reported auditory hallucinations of different voices telling her to kill herself once or twice a day but denied VH. Pt reports wanting to live for her 69 y.o son who also lives in [place]. Pt reports increasing depression  2 being put out of the house.     Total Time  spent with patient: 30 minutes  Psychiatric Specialty Exam: Physical Exam  Nursing note and vitals reviewed. Constitutional: She is oriented to person, place, and time. She appears well-developed and well-nourished.  HENT:  Head: Normocephalic and atraumatic.  Eyes: Pupils are equal, round, and reactive to light.  Neck: Normal range of motion. Neck supple.  Cardiovascular: Normal rate and regular rhythm.   Respiratory: Effort normal and breath sounds normal.  GI: Soft. Bowel sounds are normal.  Genitourinary:  Genitourinary Comments: Deferred.   Musculoskeletal: Normal range of motion.  Neurological: She is alert and oriented to person, place, and time.  Skin: Skin is warm and dry.    Review of Systems  Psychiatric/Behavioral: Positive for depression, hallucinations (hears voices telling her to kill hersefl) and suicidal ideas (plan to OD with drugs). Negative for memory loss and substance abuse. The patient is nervous/anxious. The patient does not have insomnia.   All other systems reviewed and are negative.   Blood pressure 129/62, pulse 85, temperature 98.4 F (36.9 C), temperature source Oral, resp. rate 18, SpO2 99 %.There is no height or weight on file to calculate BMI.  General Appearance: unremarkable  Eye Contact:  Good  Speech:  Clear and Coherent and Normal Rate  Volume:  Normal  Mood:  Hopeless and helpless  Affect:  Congruent  Thought Process:  Coherent  Orientation:  Full (Time, Place, and Person)  Thought Content:  WDL and Logical  Suicidal Thoughts:  Yes.  with intent/plan  Homicidal Thoughts:  No  Memory:  Immediate;   Good Recent;   Good Remote;   Fair  Judgement:  Fair  Insight:  Fair  Psychomotor Activity:  Normal  Concentration: Concentration: Good and Attention Span: Good  Recall:  Good  Fund of Knowledge:Good  Language: Good   Akathisia:  Negative  Handed:  Right  AIMS (if indicated):     Assets:  Communication Skills Desire for  Improvement Physical Health  Sleep:       Musculoskeletal: Strength & Muscle Tone: within normal limits Gait & Station: normal Patient leans: N/A  Blood pressure 129/62, pulse 85, temperature 98.4 F (36.9 C), temperature source Oral, resp. rate 18, SpO2 99 %.  Recommendations:  Based on my evaluation the patient appears to have an emergency medical condition for which I recommend the patient be transferred to the emergency department for further evaluation. Patient was recently discharge from [facility] last month and have since then completed a 14day treatment in [place]. Pt is actively endorsing suicide ideation with plan to overdose on drugs such as cocaine. Pt will be transered to [facility] for medical clearance and placement.   Katherine Aly, NP 11/26/2016, 6:45 PM

## 2016-11-26 NOTE — ED Notes (Signed)
Bed: WLPT4 Expected date:  Expected time:  Means of arrival:  Comments: 

## 2016-11-26 NOTE — ED Notes (Signed)
Patient was a walk in from [facility]. TTS consult already done.

## 2016-11-27 ENCOUNTER — Encounter (HOSPITAL_COMMUNITY): Payer: Self-pay | Admitting: *Deleted

## 2016-11-27 ENCOUNTER — Inpatient Hospital Stay (HOSPITAL_COMMUNITY)
Admission: AD | Admit: 2016-11-27 | Discharge: 2016-12-10 | DRG: 885 | Disposition: A | Discharge status: Home / Self Care | Payer: No Typology Code available for payment source | Source: Intra-hospital | Attending: Psychiatry | Admitting: Psychiatry

## 2016-11-27 DIAGNOSIS — F332 Major depressive disorder, recurrent severe without psychotic features: Principal | ICD-10-CM | POA: Diagnosis present

## 2016-11-27 DIAGNOSIS — F419 Anxiety disorder, unspecified: Secondary | ICD-10-CM | POA: Diagnosis present

## 2016-11-27 DIAGNOSIS — G4700 Insomnia, unspecified: Secondary | ICD-10-CM

## 2016-11-27 DIAGNOSIS — Z8544 Personal history of malignant neoplasm of other female genital organs: Secondary | ICD-10-CM | POA: Diagnosis not present

## 2016-11-27 DIAGNOSIS — E890 Postprocedural hypothyroidism: Secondary | ICD-10-CM | POA: Diagnosis present

## 2016-11-27 DIAGNOSIS — R45851 Suicidal ideations: Secondary | ICD-10-CM | POA: Diagnosis present

## 2016-11-27 DIAGNOSIS — F1020 Alcohol dependence, uncomplicated: Secondary | ICD-10-CM | POA: Diagnosis present

## 2016-11-27 DIAGNOSIS — Z590 Homelessness: Secondary | ICD-10-CM

## 2016-11-27 DIAGNOSIS — E785 Hyperlipidemia, unspecified: Secondary | ICD-10-CM | POA: Diagnosis present

## 2016-11-27 DIAGNOSIS — I10 Essential (primary) hypertension: Secondary | ICD-10-CM | POA: Diagnosis present

## 2016-11-27 DIAGNOSIS — Z79899 Other long term (current) drug therapy: Secondary | ICD-10-CM | POA: Diagnosis not present

## 2016-11-27 DIAGNOSIS — F1920 Other psychoactive substance dependence, uncomplicated: Secondary | ICD-10-CM | POA: Diagnosis present

## 2016-11-27 DIAGNOSIS — F17210 Nicotine dependence, cigarettes, uncomplicated: Secondary | ICD-10-CM

## 2016-11-27 LAB — URINALYSIS, ROUTINE W REFLEX MICROSCOPIC
Bilirubin Urine: NEGATIVE
GLUCOSE, UA: NEGATIVE mg/dL
HGB URINE DIPSTICK: NEGATIVE
Ketones, ur: NEGATIVE mg/dL
LEUKOCYTES UA: NEGATIVE
Nitrite: NEGATIVE
PH: 6 (ref 5.0–8.0)
Protein, ur: NEGATIVE mg/dL
Specific Gravity, Urine: 1.006 (ref 1.005–1.030)

## 2016-11-27 MED ORDER — ONDANSETRON HCL 4 MG PO TABS
4.0000 mg | ORAL_TABLET | Freq: Three times a day (TID) | ORAL | Status: DC | PRN
Start: 2016-11-27 — End: 2016-12-10

## 2016-11-27 MED ORDER — ALUM & MAG HYDROXIDE-SIMETH 200-200-20 MG/5ML PO SUSP: 30.0000 mL | ORAL | Status: DC | PRN

## 2016-11-27 MED ORDER — CITALOPRAM HYDROBROMIDE 20 MG PO TABS
20.0000 mg | ORAL_TABLET | Freq: Every day | ORAL | Status: DC
  Administered 2016-11-28 – 2016-12-01 (×4): 20 mg via ORAL
  Filled 2016-11-27 (×5): qty 1

## 2016-11-27 MED ORDER — TRAZODONE HCL 100 MG PO TABS
200.0000 mg | ORAL_TABLET | Freq: Every day | ORAL | Status: DC
  Administered 2016-11-27 – 2016-12-09 (×13): 200 mg via ORAL
  Filled 2016-11-27: qty 14
  Filled 2016-11-27 (×3): qty 2
  Filled 2016-11-27 (×2): qty 14
  Filled 2016-11-27: qty 2
  Filled 2016-11-27: qty 14
  Filled 2016-11-27 (×3): qty 2
  Filled 2016-11-27: qty 14
  Filled 2016-11-27 (×3): qty 2

## 2016-11-27 MED ORDER — IBUPROFEN 600 MG PO TABS
600.0000 mg | ORAL_TABLET | Freq: Three times a day (TID) | ORAL | Status: DC | PRN
  Administered 2016-11-27 – 2016-12-10 (×17): 600 mg via ORAL
  Filled 2016-11-27 (×5): qty 1
  Filled 2016-11-27: qty 3
  Filled 2016-11-27 (×11): qty 1

## 2016-11-27 MED ORDER — LEVOTHYROXINE SODIUM 75 MCG PO TABS
75.0000 ug | ORAL_TABLET | Freq: Every day | ORAL | Status: DC
  Administered 2016-11-28 – 2016-12-10 (×13): 75 ug via ORAL
  Filled 2016-11-27 (×2): qty 7
  Filled 2016-11-27 (×4): qty 1
  Filled 2016-11-27: qty 7
  Filled 2016-11-27: qty 1
  Filled 2016-11-27 (×2): qty 7
  Filled 2016-11-27 (×4): qty 1

## 2016-11-27 MED ORDER — POTASSIUM CHLORIDE CRYS ER 20 MEQ PO TBCR
20.0000 meq | EXTENDED_RELEASE_TABLET | Freq: Once | ORAL | Status: AC
  Administered 2016-11-27: 20 meq via ORAL
  Filled 2016-11-27: qty 1

## 2016-11-27 MED ORDER — RISPERIDONE 2 MG PO TABS
2.0000 mg | ORAL_TABLET | Freq: Every day | ORAL | Status: DC
  Administered 2016-11-27 – 2016-12-09 (×13): 2 mg via ORAL
  Filled 2016-11-27: qty 7
  Filled 2016-11-27: qty 1
  Filled 2016-11-27: qty 7
  Filled 2016-11-27 (×3): qty 1
  Filled 2016-11-27: qty 7
  Filled 2016-11-27: qty 1
  Filled 2016-11-27 (×2): qty 7
  Filled 2016-11-27 (×4): qty 1

## 2016-11-27 MED ORDER — HYDROXYZINE HCL 25 MG PO TABS
25.0000 mg | ORAL_TABLET | Freq: Four times a day (QID) | ORAL | Status: DC | PRN
  Administered 2016-11-27 – 2016-12-02 (×7): 25 mg via ORAL
  Filled 2016-11-27 (×3): qty 1
  Filled 2016-11-27: qty 10
  Filled 2016-11-27 (×4): qty 1

## 2016-11-27 MED ORDER — NICOTINE 21 MG/24HR TD PT24
21.0000 mg | MEDICATED_PATCH | Freq: Every day | TRANSDERMAL | Status: DC
  Administered 2016-11-28 – 2016-12-09 (×12): 21 mg via TRANSDERMAL
  Filled 2016-11-27 (×12): qty 1
  Filled 2016-11-27: qty 14
  Filled 2016-11-27 (×2): qty 1

## 2016-11-27 MED ORDER — MAGNESIUM HYDROXIDE 400 MG/5ML PO SUSP: 30.0000 mL | Freq: Every day | ORAL | Status: DC | PRN

## 2016-11-27 NOTE — ED Notes (Signed)
Pt transported to [facility] by [employer]. Pt calm and cooperative.

## 2016-11-27 NOTE — BH Assessment (Signed)
[place] Assessment Progress Note  Per Corena Pilgrim, MD, this pt requires psychiatric hospitalization at this time.  Letitia Libra, RN, [facility] has assigned pt to [facility] Rm [id number]; they will be ready to receive pt at 13:00.  Pt has signed Voluntary Admission and Consent for Treatment, as well as Consent to Release Information to [facility], her outpatient provider, and a notification call has been placed.  Signed forms have been faxed to [facility].  Pt's nurse, Diane, has been notified, and agrees to send original paperwork along with pt via [facility] [place], and to call report to [phone].  Jalene Mullet, [place] Triage Specialist [phone]

## 2016-11-27 NOTE — Consult Note (Signed)
[facility] Face-to-Face Psychiatry Consult   Reason for Consult:  Suicidal ideation Referring Physician:  EDP Patient Identification: [name] MRN:  [id number] Principal Diagnosis: MDD (major depressive disorder), recurrent severe, without psychosis ([place]) Diagnosis:   Patient Active Problem List   Diagnosis Date Noted  . MDD (major depressive disorder), recurrent severe, without psychosis ([place]) [F33.2] 11/03/2016    Priority: High  . Alcohol-induced mood disorder with depressive symptoms ([place]) [F10.94] 10/26/2016    Priority: High  . Poor dentition [K08.9] 06/26/2016  . Essential hypertension [I10] 06/26/2016  . Hyperlipidemia [E78.5] 06/26/2016  . Elevated transaminase level [R74.0] 08/29/2015  . Health care maintenance [Z00.00] 12/31/2013  . Polysubstance abuse (h/o cocaine, ETOH, and tobacco abuse)  [F19.10]   . Primary vulvar squamous cell carcinoma s/p resection 2011 [C51.9] 02/13/2010  . 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 TOBACCO ABUSE [F17.200] 04/12/2006    Total Time spent with patient: 30 minutes  Subjective:   [name] is a 56 y.o. female patient admitted with reports of suicidal ideation and depression. Pt seen and chart reviewed. Pt is alert/oriented x4, calm, cooperative, and appropriate to situation. Pt denies homicidal ideation and psychosis and does not appear to be responding to internal stimuli. Pt contiunes to endorse suicidal ideation secondary to being homeless as well as running out of her medications for psychiatry after leaving [facility] due to financial reasons. Pt unable to contract for safety.   HPI:  I have reviewed and concur with HPI elements below, modified as follows: "Pt presents voluntarily to [facility] for assessment. She is cooperative and oriented x 4. Per chart review, pt was admittted to [facility] in late April 2018 and d/c 11/07/16. Pt says, "I want to kill myself to real."  Pt endorses SI with plan to overdose on heroin. She says  that she completed [facility] 14 day program after leaving [facility] and then she moved in with a roommate. Pt says that her boyfriend wasn't able to provide pt with rent money, so her roommate kicked her out today. Pt says that her mood had been good until today. She now endorses fatigue and worthlessness. She endorses moderate anxiety. She reports she hasn't used crack cocaine or etoh since before pt was admitted to [facility] in April. Pt states she has been taking her meds as prescribed. She says she has an appt with [facility] 6/11 for med management/talk therapy. She reports short term memory impairment and decreased concentration. Pt denies homicidal thoughts or physical aggression. Pt denies having access to firearms. Pt denies having any legal problems at this time. She reports her mom and paternal uncle had MDD. She sts her mom went to mental hospital's when pt was young. Pt denies HI. She denies [facility] and no delusions noted."  Interval History 11/27/16: Pt has been cooperative in the ED yet continues to present as suicidal and severely depressed.   Past Psychiatric History: depression, suicidal ideation  Risk to Self: Is patient at risk for suicide?: Yes Risk to Others:   Prior Inpatient Therapy:   Prior Outpatient Therapy:    Past Medical History:  Past Medical History:  Diagnosis Date  . Cocaine abuse   . ETOH abuse   . Hypothyroidism   . Unspecified mood (affective) disorder [facility])     Past Surgical History:  Procedure Laterality Date  . radioactive iodine ablation     graves disease s/p   Family History: History reviewed. No pertinent family history. Family Psychiatric  History: depression Social  History:  History  Alcohol Use No    Comment: Last drink: In the last month.      History  Drug Use No    Comment: Used Crack. Last used: Earlier in the month.     Social History   Social History  . Marital status: Single    Spouse name: N/A  . Number of children: N/A  . Years of  education: N/A   Social History Main Topics  . Smoking status: Current Every Day Smoker    Packs/day: 0.30    Types: Cigarettes  . Smokeless tobacco: Never Used  . Alcohol use No     Comment: Last drink: In the last month.   . Drug use: No     Comment: Used Crack. Last used: Earlier in the month.   . Sexual activity: Not Asked   Other Topics Concern  . None   Social History Narrative  . None   Additional Social History:    Allergies:  No Known Allergies  Labs:  Results for orders placed or performed during the hospital encounter of 11/26/16 (from the past 48 hour(s))  Rapid urine drug screen (hospital performed)     Status: None   Collection Time: 11/26/16  7:54 PM  Result Value Ref Range   Opiates NONE DETECTED NONE DETECTED   Cocaine NONE DETECTED NONE DETECTED   Benzodiazepines NONE DETECTED NONE DETECTED   Amphetamines NONE DETECTED NONE DETECTED   Tetrahydrocannabinol NONE DETECTED NONE DETECTED   Barbiturates NONE DETECTED NONE DETECTED    Comment:        DRUG SCREEN FOR MEDICAL PURPOSES ONLY.  IF CONFIRMATION IS NEEDED FOR ANY PURPOSE, NOTIFY LAB WITHIN 5 DAYS.        LOWEST DETECTABLE LIMITS FOR URINE DRUG SCREEN Drug Class       Cutoff (ng/mL) Amphetamine      1000 Barbiturate      200 Benzodiazepine   665 Tricyclics       993 Opiates          300 Cocaine          300 THC              50   Comprehensive metabolic panel     Status: Abnormal   Collection Time: 11/26/16  8:09 PM  Result Value Ref Range   Sodium 142 135 - 145 mmol/L   Potassium 3.4 (L) 3.5 - 5.1 mmol/L   Chloride 107 101 - 111 mmol/L   CO2 27 22 - 32 mmol/L   Glucose, Bld 121 (H) 65 - 99 mg/dL   BUN 15 6 - 20 mg/dL   Creatinine, Ser 0.70 0.44 - 1.00 mg/dL   Calcium 9.3 8.9 - 10.3 mg/dL   Total Protein 7.3 6.5 - 8.1 g/dL   Albumin 4.2 3.5 - 5.0 g/dL   AST 42 (H) 15 - 41 U/L   ALT 47 14 - 54 U/L   Alkaline Phosphatase 69 38 - 126 U/L   Total Bilirubin 0.4 0.3 - 1.2 mg/dL   GFR  calc non Af Amer >60 >60 mL/min   GFR calc Af Amer >60 >60 mL/min    Comment: (NOTE) The eGFR has been calculated using the CKD EPI equation. This calculation has not been validated in all clinical situations. eGFR's persistently <60 mL/min signify possible Chronic Kidney Disease.    Anion gap 8 5 - 15  Ethanol     Status: None   Collection Time: 11/26/16  8:09 PM  Result Value Ref Range   Alcohol, Ethyl (B) <5 <5 mg/dL    Comment:        LOWEST DETECTABLE LIMIT FOR SERUM ALCOHOL IS 5 mg/dL FOR MEDICAL PURPOSES ONLY   Salicylate level     Status: None   Collection Time: 11/26/16  8:09 PM  Result Value Ref Range   Salicylate Lvl <1.6 2.8 - 30.0 mg/dL  Acetaminophen level     Status: Abnormal   Collection Time: 11/26/16  8:09 PM  Result Value Ref Range   Acetaminophen (Tylenol), Serum <10 (L) 10 - 30 ug/mL    Comment:        THERAPEUTIC CONCENTRATIONS VARY SIGNIFICANTLY. A RANGE OF 10-30 ug/mL MAY BE AN EFFECTIVE CONCENTRATION FOR MANY PATIENTS. HOWEVER, SOME ARE BEST TREATED AT CONCENTRATIONS OUTSIDE THIS RANGE. ACETAMINOPHEN CONCENTRATIONS >150 ug/mL AT 4 HOURS AFTER INGESTION AND >50 ug/mL AT 12 HOURS AFTER INGESTION ARE OFTEN ASSOCIATED WITH TOXIC REACTIONS.   cbc     Status: None   Collection Time: 11/26/16  8:09 PM  Result Value Ref Range   WBC 7.0 4.0 - 10.5 K/uL   RBC 4.44 3.87 - 5.11 MIL/uL   Hemoglobin 12.4 12.0 - 15.0 g/dL   HCT 37.4 36.0 - 46.0 %   MCV 84.2 78.0 - 100.0 fL   MCH 27.9 26.0 - 34.0 pg   MCHC 33.2 30.0 - 36.0 g/dL   RDW 13.3 11.5 - 15.5 %   Platelets 264 150 - 400 K/uL    Current Facility-Administered Medications  Medication Dose Route Frequency Provider Last Rate Last Dose  . acetaminophen (TYLENOL) tablet 650 mg  650 mg Oral Q4H PRN Daleen Bo, MD      . alum & mag hydroxide-simeth (MAALOX/MYLANTA) 200-200-20 MG/5ML suspension 30 mL  30 mL Oral Q6H PRN Daleen Bo, MD      . citalopram (CELEXA) tablet 20 mg  20 mg Oral Daily  Daleen Bo, MD   20 mg at 11/27/16 1096  . hydrOXYzine (ATARAX/VISTARIL) tablet 25 mg  25 mg Oral Q6H PRN Daleen Bo, MD      . ibuprofen (ADVIL,MOTRIN) tablet 600 mg  600 mg Oral Q8H PRN Daleen Bo, MD      . levothyroxine (SYNTHROID, LEVOTHROID) tablet 75 mcg  75 mcg Oral Q0600 Daleen Bo, MD   75 mcg at 11/27/16 0620  . nicotine (NICODERM CQ - dosed in mg/24 hours) patch 21 mg  21 mg Transdermal Daily Daleen Bo, MD   21 mg at 11/27/16 0924  . ondansetron (ZOFRAN) tablet 4 mg  4 mg Oral Q8H PRN Daleen Bo, MD      . potassium chloride SA (K-DUR,KLOR-CON) CR tablet 20 mEq  20 mEq Oral Once [name], FNP      . risperiDONE (RISPERDAL) tablet 2 mg  2 mg Oral QHS Daleen Bo, MD   2 mg at 11/26/16 2231  . traZODone (DESYREL) tablet 200 mg  200 mg Oral QHS Daleen Bo, MD   200 mg at 11/26/16 2231   Current Outpatient Prescriptions  Medication Sig Dispense Refill  . citalopram (CELEXA) 20 MG tablet Take 1 tablet (20 mg total) by mouth daily. For depression 30 tablet 0  . hydrOXYzine (ATARAX/VISTARIL) 25 MG tablet Take 1 tablet (25 mg total) by mouth every 6 (six) hours as needed for anxiety. 60 tablet 0  . levothyroxine (SYNTHROID, LEVOTHROID) 75 MCG tablet Take 1 tablet (75 mcg total) by mouth daily at 6 (six) AM. For  low functioning thyroid 30 tablet 0  . nicotine (NICODERM CQ - DOSED IN MG/24 HOURS) 21 mg/24hr patch Place 1 patch (21 mg total) onto the skin daily. For smoking cessation 28 patch 0  . risperiDONE (RISPERDAL) 2 MG tablet Take 1 tablet (2 mg total) by mouth at bedtime. For mood control 30 tablet 0  . traZODone (DESYREL) 100 MG tablet Take 2 tablets (200 mg total) by mouth at bedtime. For sleep 60 tablet 0    Musculoskeletal: Strength & Muscle Tone: within normal limits Gait & Station: normal Patient leans: N/A  Psychiatric Specialty Exam: Physical Exam  Review of Systems  Psychiatric/Behavioral: Positive for depression and suicidal ideas.  The patient is nervous/anxious and has insomnia.   All other systems reviewed and are negative.   Blood pressure 118/61, pulse 98, temperature 97.9 F (36.6 C), temperature source Oral, resp. rate 20, height 5' 1"  (1.549 m), weight 75 kg (165 lb 6 oz), SpO2 94 %.Body mass index is 31.25 kg/m.  General Appearance: Casual and Fairly Groomed  Eye Contact:  Fair  Speech:  Clear and Coherent and Normal Rate  Volume:  Decreased  Mood:  Depressed  Affect:  Appropriate, Congruent and Depressed  Thought Process:  Coherent, Goal Directed, Linear and Descriptions of Associations: Intact  Orientation:  Full (Time, Place, and Person)  Thought Content:  Focused on suicide  Suicidal Thoughts:  Yes.  with intent/plan  Homicidal Thoughts:  No  Memory:  Immediate;   Fair Recent;   Fair Remote;   Fair  Judgement:  Fair  Insight:  Fair  Psychomotor Activity:  Normal  Concentration:  Concentration: Fair  Recall:  [employer] of Knowledge:  Fair  Language:  Fair  Akathisia:    Handed:    AIMS (if indicated):     Assets:  Communication Skills Resilience Social Support  ADL's:  Intact  Cognition:  WNL  Sleep:      Treatment Plan Summary: MDD (major depressive disorder), recurrent severe, without psychosis ([place]) unstable, managed as below:  Medications: -Continue Celexa 35m po daily for depression -COntinue Nicotine patch -Continue Risperidone 215mpo qhs for psychotic features  -Continue trazodone 20062mo qhs prn insomnia  Disposition: Recommend psychiatric Inpatient admission when medically cleared. WitBenjamine MolaNP 11/27/2016 11:37 AM  Patient seen face-to-face for psychiatric evaluation, chart reviewed and case discussed with the physician extender and developed treatment plan. Reviewed the information documented and agree with the treatment plan. MojCorena PilgrimD

## 2016-11-27 NOTE — Progress Notes (Signed)
Admission Note:  56 year old female who presents, in no acute distress, for the treatment of SI and Depression. Patient appears flat and depressed. Patient was calm and cooperative with admission process. Patient presents with passive SI, with a plan to "drink, go back to smoking crack, and overdose on heroin". Patient contracts for safety upon admission. Patient denies AVH.  Patient identifies stressors of "got kicked out by my roommate because I couldn't pay rent", "money issues", and "my depression is getting worst".  Patient is currently homeless and identifies "friend" as her support system.  Patient denies any significant medical hx.  Patient had a recent [facility] admission in April and spent 14 days at [facility].  Patient denies recent drug and alcohol use. Patient reports hx of crack use.  While at [facility], patient would like to work on "my anger" and "my [place] meetings".  Patient would like to get into a halfway house like "[facility]" upon discharge.  Skin was assessed and found to be clear of any abnormal marks.  Patient searched and no contraband found, POC and unit policies explained and understanding verbalized. Consents obtained. Report given to accepting nurse.  Patient placed on q 15 minute safety precautions.  Patient had no additional questions or concerns.

## 2016-11-27 NOTE — Tx Team (Signed)
Initial Treatment Plan 11/27/2016 3:33 PM [name] UXN:[id number]    PATIENT STRESSORS: Financial difficulties Loss of housing   PATIENT STRENGTHS: Average or above average intelligence Communication skills Motivation for treatment/growth Supportive family/friends   PATIENT IDENTIFIED PROBLEMS: At risk for suicide  "My anger"  "My AA meetings"                 DISCHARGE CRITERIA:  Adequate post-discharge living arrangements Improved stabilization in mood, thinking, and/or behavior Motivation to continue treatment in a less acute level of care Need for constant or close observation no longer present Verbal commitment to aftercare and medication compliance  PRELIMINARY DISCHARGE PLAN: Outpatient therapy Placement in alternative living arrangements  PATIENT/FAMILY INVOLVEMENT: This treatment plan has been presented to and reviewed with the patient, [name].  The patient and family have been given the opportunity to ask questions and make suggestions.  Dustin Flock, RN 11/27/2016, 3:33 PM

## 2016-11-27 NOTE — Progress Notes (Signed)
D: Pt was in the dayroom upon initial approach.  Pt presents with anxious, depressed affect and mood.  She reports her day was "a little okay, well stressful, but I'm gonna be okay."  Pt denies SI/HI, denies hallucinations, reports left knee pain of 8/10.  Pt has been visible in milieu interacting with peers and staff appropriately.  Pt attended evening group.   A: Introduced self to pt.  Actively listened to pt and offered support and encouragement. Medications administered per order.  PRN medication administered for anxiety and pain.  Q15 minute safety checks maintained.  R: Pt is safe on the unit.  Pt is compliant with medications.  Pt verbally contracts for safety.  Will continue to monitor and assess.

## 2016-11-27 NOTE — ED Notes (Signed)
Introduced self to patient. Pt oriented to unit expectations.  Assessed pt for:  A) Anxiety &/or agitation: Pt has been calm and cooperative. She is endorsing SI with a plan to OD. Denies SI/HI/AVH.   S) Safety: Safety maintained with q-15-minute checks and hourly rounds by staff.  A) ADLs: Pt able to perform ADLs independently.  P) Pick-Up (room cleanliness): Pt's room clean and free of clutter.

## 2016-11-28 DIAGNOSIS — F1490 Cocaine use, unspecified, uncomplicated: Secondary | ICD-10-CM

## 2016-11-28 DIAGNOSIS — F332 Major depressive disorder, recurrent severe without psychotic features: Principal | ICD-10-CM

## 2016-11-28 DIAGNOSIS — F1099 Alcohol use, unspecified with unspecified alcohol-induced disorder: Secondary | ICD-10-CM

## 2016-11-28 NOTE — Progress Notes (Signed)
D: Pt presents animated on approach. Pt stated that she feels increasingly depressed and then started to smile. Pt stated that she didn't want to talk about it during assessment. Pt rates depression 3/10. Anxiety 5/10. Pt endorses passive SI with no plan or intent. Pt verbally contracts for safety. Pt seeking long-term treatment at a halfway house.  A: Medications reviewed with pt. Medications administered as ordered per MD. Verbal support provided. Pt encouraged to attend groups. 15 minute checks performed for safety. R: Pt compliant with tx.
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
## 2016-11-28 NOTE — Progress Notes (Signed)
Recreation Therapy Notes  Date: 11/28/16 Time: 0930 Location: [address] Dayroom  Group Topic: Stress Management  Goal Area(s) Addresses:  Patient will verbalize importance of using healthy stress management.  Patient will identify positive emotions associated with healthy stress management.   Behavioral Response: Engaged  Intervention: Stress Management  Activity :  Body Scan Meditation.  LRT introduced the stress management technique of meditation.  LRT played a meditation to allow patients to take inventory of the sensations they are feeling in their body.  Patients were to follow along as the meditation was played to fully engage in the technique.  Education:  Stress Management, Discharge Planning.   Education Outcome: Acknowledges edcuation/In group clarification offered/Needs additional education  Clinical Observations/Feedback: Pt attended group.   Victorino Sparrow, LRT/CTRS         Victorino Sparrow A 11/28/2016 11:27 AM

## 2016-11-28 NOTE — BHH Group Notes (Signed)
[place] LCSW Group Therapy 11/28/2016 1:15 PM  Type of Therapy: Group Therapy- Emotion Regulation  Participation Level: Pt was present for the duration of the group. Did not participate in the discussion but listened attentively throughout.   Georga Kaufmann, MSW, LCSWA 11/28/2016 2:41 PM
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
## 2016-11-28 NOTE — H&P (Signed)
Psychiatric Admission Assessment Adult  Patient Identification: [name]  MRN:  [id number]  Date of Evaluation:  11/28/2016  Chief Complaint: Suicidal thoughts.  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 MDD (major depressive disorder), recurrent severe, without psychosis ([place]) [F33.2] 11/03/2016  . Alcohol-induced mood disorder with depressive symptoms ([place]) [F10.94] 10/26/2016  . Poor dentition [K08.9] 06/26/2016  . Essential hypertension [I10] 06/26/2016  . Hyperlipidemia [E78.5] 06/26/2016  . Elevated transaminase level [R74.0] 08/29/2015  . Health care maintenance [Z00.00] 12/31/2013  . Polysubstance abuse (h/o cocaine, ETOH, and tobacco abuse)  [F19.10]   . Primary vulvar squamous cell carcinoma s/p resection 2011 [C51.9] 02/13/2010  . 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 TOBACCO ABUSE [F17.200] 04/12/2006   History of Present Illness: This is a re-admission assessment for this 56 year old AA female with hx of alcohol & drug addiction. She was discharged from this hospital on 11-07-16 & sent to the [facility] for further substance abuse treatment. Patient is being re-admitted for worsening symptoms of depression & suicidal ideations. During this assessment, [name] reports, "Someone dropped me off to the [facility] ED because I was thinking of killing myself. I had already swallowed 3 tablets of my sleeping pills, had to stop myself because I needed help. After graduating from [employer] 3 weeks ago, I was suppose to go to the home of the lady that I met in this hospital. This lady told me that if I can come live in her house, she will charge me a little bit money every month to stay there. After I got out of [facility], I tried to go to this lady's house as she promised me, but the plan fell through. And because I did  not have a place to live, I was afraid that I'm going relapse. I became suicidal & took the three sleeping pills, but had to stop myself because I want to go to a half-way house after discharge this time. I need help".  Associated Signs/Symptoms:  Depression Symptoms:  depressed mood, feelings of worthlessness/guilt, hopelessness, suicidal thoughts with specific plan, suicidal attempt,  (Hypo) Manic Symptoms:  Impulsivity,  Anxiety Symptoms:  Excessive Worry,  Psychotic Symptoms:  Denies any hallucinations, delusional thoughts or paranoia.  PTSD Symptoms: NA  Total Time spent with patient: 1 hour  Past Psychiatric History: Polysubstance  Use disorder, Mdd.  Is the patient at risk to self? No.  Has the patient been a risk to self in the past 6 months? Yes.    Has the patient been a risk to self within the distant past? Yes.    Is the patient a risk to others? No.  Has the patient been a risk to others in the past 6 months? No.  Has the patient been a risk to others within the distant past? No.   Prior Inpatient Therapy: Yes  Prior Outpatient Therapy: Yes  Alcohol Screening: 1. How often do you have a drink containing alcohol?: Never 2. How many drinks containing alcohol do you have on a typical day when you are drinking?: 1 or 2 3. How often do you have six or more drinks on one occasion?: Never Preliminary Score: 0 4. How often during the last year have you found that you were not able to stop drinking once you had started?: Never 5. How often during the last year  have you failed to do what was normally expected from you becasue of drinking?: Never 6. How often during the last year have you needed a first drink in the morning to get yourself going after a heavy drinking session?: Never 7. How often during the last year have you had a feeling of guilt of remorse after drinking?: Never 8. How often during the last year have you been unable to remember what happened the night  before because you had been drinking?: Never 9. Have you or someone else been injured as a result of your drinking?: No 10. Has a relative or friend or a doctor or another health worker been concerned about your drinking or suggested you cut down?: No Alcohol Use Disorder Identification Test Final Score (AUDIT): 0 Brief Intervention: AUDIT score less than 7 or less-screening does not suggest unhealthy drinking-brief intervention not indicated    Substance Abuse History in the last 12 months:  Yes.    Consequences of Substance Abuse: Medical Consequences:  Liver damage, Possible death by overdose Legal Consequences:  Arrests, jail time, Loss of driving privilege. Family Consequences:  Family discord, divorce and or separation.  Previous Psychotropic Medications: YES  Psychological Evaluations: NO  Past Medical History:  Past Medical History:  Diagnosis Date  . Cocaine abuse   . ETOH abuse   . Hypothyroidism   . Unspecified mood (affective) disorder [facility])     Past Surgical History:  Procedure Laterality Date  . radioactive iodine ablation     graves disease s/p   Family History: History reviewed. No pertinent family history.  Family Psychiatric  History: Denies any familial hx of mental illness.  Tobacco Screening: Have you used any form of tobacco in the last 30 days? (Cigarettes, Smokeless Tobacco, Cigars, and/or Pipes): Yes Tobacco use, Select all that apply: 5 or more cigarettes per day Are you interested in Tobacco Cessation Medications?: No, patient refused Counseled patient on smoking cessation including recognizing danger situations, developing coping skills and basic information about quitting provided: Yes  Social History:  History  Alcohol Use No    Comment: Last drink: In the last month.      History  Drug Use No    Comment: Used Crack. Last used: Earlier in the month.     Additional Social History:  Allergies:  No Known Allergies  Lab Results:  Results  for orders placed or performed during the hospital encounter of 11/26/16 (from the past 48 hour(s))  Rapid urine drug screen (hospital performed)     Status: None   Collection Time: 11/26/16  7:54 PM  Result Value Ref Range   Opiates NONE DETECTED NONE DETECTED   Cocaine NONE DETECTED NONE DETECTED   Benzodiazepines NONE DETECTED NONE DETECTED   Amphetamines NONE DETECTED NONE DETECTED   Tetrahydrocannabinol NONE DETECTED NONE DETECTED   Barbiturates NONE DETECTED NONE DETECTED    Comment:        DRUG SCREEN FOR MEDICAL PURPOSES ONLY.  IF CONFIRMATION IS NEEDED FOR ANY PURPOSE, NOTIFY LAB WITHIN 5 DAYS.        LOWEST DETECTABLE LIMITS FOR URINE DRUG SCREEN Drug Class       Cutoff (ng/mL) Amphetamine      1000 Barbiturate      200 Benzodiazepine   073 Tricyclics       710 Opiates          300 Cocaine          300 THC  50   Comprehensive metabolic panel     Status: Abnormal   Collection Time: 11/26/16  8:09 PM  Result Value Ref Range   Sodium 142 135 - 145 mmol/L   Potassium 3.4 (L) 3.5 - 5.1 mmol/L   Chloride 107 101 - 111 mmol/L   CO2 27 22 - 32 mmol/L   Glucose, Bld 121 (H) 65 - 99 mg/dL   BUN 15 6 - 20 mg/dL   Creatinine, Ser 0.70 0.44 - 1.00 mg/dL   Calcium 9.3 8.9 - 10.3 mg/dL   Total Protein 7.3 6.5 - 8.1 g/dL   Albumin 4.2 3.5 - 5.0 g/dL   AST 42 (H) 15 - 41 U/L   ALT 47 14 - 54 U/L   Alkaline Phosphatase 69 38 - 126 U/L   Total Bilirubin 0.4 0.3 - 1.2 mg/dL   GFR calc non Af Amer >60 >60 mL/min   GFR calc Af Amer >60 >60 mL/min    Comment: (NOTE) The eGFR has been calculated using the CKD EPI equation. This calculation has not been validated in all clinical situations. eGFR's persistently <60 mL/min signify possible Chronic Kidney Disease.    Anion gap 8 5 - 15  Ethanol     Status: None   Collection Time: 11/26/16  8:09 PM  Result Value Ref Range   Alcohol, Ethyl (B) <5 <5 mg/dL    Comment:        LOWEST DETECTABLE LIMIT FOR SERUM ALCOHOL  IS 5 mg/dL FOR MEDICAL PURPOSES ONLY   Salicylate level     Status: None   Collection Time: 11/26/16  8:09 PM  Result Value Ref Range   Salicylate Lvl <7.7 2.8 - 30.0 mg/dL  Acetaminophen level     Status: Abnormal   Collection Time: 11/26/16  8:09 PM  Result Value Ref Range   Acetaminophen (Tylenol), Serum <10 (L) 10 - 30 ug/mL    Comment:        THERAPEUTIC CONCENTRATIONS VARY SIGNIFICANTLY. A RANGE OF 10-30 ug/mL MAY BE AN EFFECTIVE CONCENTRATION FOR MANY PATIENTS. HOWEVER, SOME ARE BEST TREATED AT CONCENTRATIONS OUTSIDE THIS RANGE. ACETAMINOPHEN CONCENTRATIONS >150 ug/mL AT 4 HOURS AFTER INGESTION AND >50 ug/mL AT 12 HOURS AFTER INGESTION ARE OFTEN ASSOCIATED WITH TOXIC REACTIONS.   cbc     Status: None   Collection Time: 11/26/16  8:09 PM  Result Value Ref Range   WBC 7.0 4.0 - 10.5 K/uL   RBC 4.44 3.87 - 5.11 MIL/uL   Hemoglobin 12.4 12.0 - 15.0 g/dL   HCT 37.4 36.0 - 46.0 %   MCV 84.2 78.0 - 100.0 fL   MCH 27.9 26.0 - 34.0 pg   MCHC 33.2 30.0 - 36.0 g/dL   RDW 13.3 11.5 - 15.5 %   Platelets 264 150 - 400 K/uL  Urinalysis, Routine w reflex microscopic     Status: Abnormal   Collection Time: 11/27/16 12:50 PM  Result Value Ref Range   Color, Urine STRAW (A) YELLOW   APPearance CLEAR CLEAR   Specific Gravity, Urine 1.006 1.005 - 1.030   pH 6.0 5.0 - 8.0   Glucose, UA NEGATIVE NEGATIVE mg/dL   Hgb urine dipstick NEGATIVE NEGATIVE   Bilirubin Urine NEGATIVE NEGATIVE   Ketones, ur NEGATIVE NEGATIVE mg/dL   Protein, ur NEGATIVE NEGATIVE mg/dL   Nitrite NEGATIVE NEGATIVE   Leukocytes, UA NEGATIVE NEGATIVE   Blood Alcohol level:  Lab Results  Component Value Date   ETH <5 11/26/2016   ETH 208 (H)  25/00/3704   Metabolic Disorder Labs:  No results found for: HGBA1C, MPG No results found for: PROLACTIN Lab Results  Component Value Date   CHOL 191 09/12/2011   TRIG 64 09/12/2011   HDL 66 09/12/2011   CHOLHDL 2.9 09/12/2011   VLDL 13 09/12/2011   LDLCALC  112 (H) 09/12/2011   LDLCALC  12/26/2008    71        Total Cholesterol/HDL:CHD Risk Coronary Heart Disease Risk Table                     Men   Women  1/2 Average Risk   3.4   3.3  Average Risk       5.0   4.4  2 X Average Risk   9.6   7.1  3 X Average Risk  23.4   11.0        Use the calculated Patient Ratio above and the CHD Risk Table to determine the patient's CHD Risk.        ATP III CLASSIFICATION (LDL):  <100     mg/dL   Optimal  100-129  mg/dL   Near or Above                    Optimal  130-159  mg/dL   Borderline  160-189  mg/dL   High  >190     mg/dL   Very High   Current Medications: Current Facility-Administered Medications  Medication Dose Route Frequency Provider Last Rate Last Dose  . alum & mag hydroxide-simeth (MAALOX/MYLANTA) 200-200-20 MG/5ML suspension 30 mL  30 mL Oral Q4H PRN Withrow, [name], FNP      . citalopram (CELEXA) tablet 20 mg  20 mg Oral Daily [name], FNP   20 mg at 11/28/16 8889  . hydrOXYzine (ATARAX/VISTARIL) tablet 25 mg  25 mg Oral Q6H PRN [name], FNP   25 mg at 11/27/16 1951  . ibuprofen (ADVIL,MOTRIN) tablet 600 mg  600 mg Oral Q8H PRN [name], FNP   600 mg at 11/27/16 1951  . levothyroxine (SYNTHROID, LEVOTHROID) tablet 75 mcg  75 mcg Oral Q0600 [name], FNP   75 mcg at 11/28/16 1694  . magnesium hydroxide (MILK OF MAGNESIA) suspension 30 mL  30 mL Oral Daily PRN Withrow, [name], FNP      . nicotine (NICODERM CQ - dosed in mg/24 hours) patch 21 mg  21 mg Transdermal Daily [name], FNP   21 mg at 11/28/16 0808  . ondansetron (ZOFRAN) tablet 4 mg  4 mg Oral Q8H PRN Withrow, [name], FNP      . risperiDONE (RISPERDAL) tablet 2 mg  2 mg Oral QHS Withrow, [name], FNP   2 mg at 11/27/16 2100  . traZODone (DESYREL) tablet 200 mg  200 mg Oral QHS Withrow, [name], FNP   200 mg at 11/27/16 2100   PTA Medications: Prescriptions Prior to Admission  Medication Sig Dispense Refill Last Dose  . citalopram  (CELEXA) 20 MG tablet Take 1 tablet (20 mg total) by mouth daily. For depression 30 tablet 0 Past Week at Unknown time  . hydrOXYzine (ATARAX/VISTARIL) 25 MG tablet Take 1 tablet (25 mg total) by mouth every 6 (six) hours as needed for anxiety. 60 tablet 0 Past Week at Unknown time  . levothyroxine (SYNTHROID, LEVOTHROID) 75 MCG tablet Take 1 tablet (75 mcg total) by mouth daily at 6 (six) AM. For  low functioning thyroid 30 tablet 0 11/26/2016 at Unknown time  . nicotine (NICODERM CQ - DOSED IN MG/24 HOURS) 21 mg/24hr patch Place 1 patch (21 mg total) onto the skin daily. For smoking cessation 28 patch 0 11/25/2016 at Unknown time  . risperiDONE (RISPERDAL) 2 MG tablet Take 1 tablet (2 mg total) by mouth at bedtime. For mood control 30 tablet 0 11/25/2016 at Unknown time  . traZODone (DESYREL) 100 MG tablet Take 2 tablets (200 mg total) by mouth at bedtime. For sleep 60 tablet 0 11/25/2016 at Unknown time   Musculoskeletal: Strength & Muscle Tone: within normal limits Gait & Station: normal Patient leans: N/A slight limp  Psychiatric Specialty Exam: Physical Exam  Nursing note and vitals reviewed. Constitutional: She is oriented to person, place, and time. She appears well-developed and well-nourished.  HENT:  Head: Normocephalic.  Eyes: Pupils are equal, round, and reactive to light.  Neck: Normal range of motion.  Cardiovascular: Normal rate.   Respiratory: Effort normal.  GI: Soft.  Genitourinary:  Genitourinary Comments: Deferred  Musculoskeletal: Normal range of motion.  Neurological: She is alert and oriented to person, place, and time.  Skin: Skin is warm.  Psychiatric: She has a normal mood and affect. Her behavior is normal.    Review of Systems  Constitutional: Negative.   HENT: Negative.   Eyes: Negative.   Respiratory: Negative.   Cardiovascular: Negative.   Gastrointestinal: Negative.   Genitourinary: Negative.   Musculoskeletal: Negative.   Skin: Negative.    Neurological: Negative.   Endo/Heme/Allergies: Negative.   Psychiatric/Behavioral: Positive for depression, substance abuse (Hx. Alcoholism) and suicidal ideas. Negative for hallucinations and memory loss. The patient is nervous/anxious and has insomnia.     Blood pressure (!) 116/56, pulse (!) 102, temperature 97.6 F (36.4 C), temperature source Oral, resp. rate 16, height 5' 1" (1.549 m), weight 72.1 kg (159 lb).Body mass index is 30.04 kg/m.  General Appearance: Casual  Eye Contact:  Good  Speech:  Clear and Coherent and Normal Rate  Volume:  Normal  Mood:  Anxious and Depressed  Affect:  Flat  Thought Process:  Coherent and Goal Directed  Orientation:  Full (Time, Place, and Person)  Thought Content:  Denies any hallucinations, delusional thoughts or paranoia.   Suicidal Thoughts:  Currently denies any thoughts, plans or intent.  Homicidal Thoughts:  Denies any thoughts, plans or intent.  Memory:  Immediate;   Good Recent;   Good Remote;   Good  Judgement:  Fair  Insight:  Present  Psychomotor Activity:  Decreased  Concentration:  Concentration: Good and Attention Span: Good  Recall:  Good  Fund of Knowledge:  Fair  Language:  Good  Akathisia:  Negative  Handed:  Right  AIMS (if indicated):     Assets:  Communication Skills Desire for Improvement  ADL's:  Intact  Cognition:  WNL  Sleep:  Number of Hours: 6.75   Treatment Plan/Recommendations: 1. Admit for crisis management and stabilization, estimated length of stay 3-5 days.   2. Medication management to reduce current symptoms to base line and improve the patient's overall level of functioning: Continue Citalopram 20 mg for depression, Hydroxyzine 25 mg prn for anxiety, Risperdal 2 mg for mood control, Nicotine patch 21 mg for nicotine withdrawal & Trazodone 200 mg for insomnia.   3. Treat health problems as indicated.  4. Develop treatment plan to decrease risk of relapse upon discharge and the need for  readmission.  5. Psycho-social education regarding relapse prevention  and self care.  6. Health care follow up as needed for medical problems.  7. Review, reconcile, and reinstate any pertinent home medications for other health issues where appropriate. 8. Call for consults with hospitalist for any additional specialty patient care services as needed.  Observation Level/Precautions:  15 minute checks  Laboratory:  Per ED  Psychotherapy: Group session  Medications: See [facility]  Consultations:  Psychiatry  Discharge Concerns: Safety, stabilization stability, and risk of access to    Estimated LOS: 3-5 days  Other: Admit to [address]   Physician Treatment Plan for Primary Diagnosis: Will initiate medication management for mood stability. Set up an outpatient psychiatric services for medication management. Will encourage medication adherence with psychiatric medications.  Long Term Goal(s): Improvement in symptoms so as ready for discharge  Short Term Goals: Ability to identify changes in lifestyle to reduce recurrence of condition will improve, Ability to verbalize feelings will improve, Ability to disclose and discuss suicidal ideas and Ability to demonstrate self-control will improve  Physician Treatment Plan for Secondary Diagnosis: Active Problems:   MDD (major depressive disorder), recurrent severe, without psychosis ([place])  Long Term Goal(s): Improvement in symptoms so as ready for discharge  Short Term Goals: Ability to identify and develop effective coping behaviors will improve, Ability to maintain clinical measurements within normal limits will improve and Ability to identify triggers associated with substance abuse/mental health issues will improve  I certify that inpatient services furnished can reasonably be expected to improve the patient's condition.    Encarnacion Slates, NP, PMHNP, FNP-BC 5/30/201810:22 AM  Patient seen, chart reviewed and case discussed with the treatment team  and completed admission suicide risk assessment and formulated treatment plan. Reviewed the information documented and agree with the treatment plan.  Alika Eppes 11/29/2016 11:31 AM

## 2016-11-28 NOTE — Progress Notes (Signed)
Adult Psychoeducational Group Note  Date:  11/28/2016 Time:  2:40 AM  Group Topic/Focus:  Wrap-Up Group:   The focus of this group is to help patients review their daily goal of treatment and discuss progress on daily workbooks.  Participation Level:  Active  Participation Quality:  Appropriate, Attentive and Sharing  Affect:  Appropriate  Cognitive:  Appropriate  Insight: Appropriate and Good  Engagement in Group:  Developing/Improving  Modes of Intervention:  Discussion  Additional Comments:  Pt shared during group that she is going to work on her anger and stress while she is here.  Abe People Brittini 11/28/2016, 2:40 AM

## 2016-11-29 DIAGNOSIS — R45851 Suicidal ideations: Secondary | ICD-10-CM

## 2016-11-29 DIAGNOSIS — Z560 Unemployment, unspecified: Secondary | ICD-10-CM

## 2016-11-29 DIAGNOSIS — F17210 Nicotine dependence, cigarettes, uncomplicated: Secondary | ICD-10-CM

## 2016-11-29 DIAGNOSIS — E039 Hypothyroidism, unspecified: Secondary | ICD-10-CM

## 2016-11-29 NOTE — Progress Notes (Signed)
Pt is in dayroom asking when she can have her medications.  Pt wants PRN meds also.  Pt sts she is better today and is working with SW to find halfway house.  Pt sts she still has passive SI but verbally contracts for safety. Pt denies pain at this time. Pt redirected and educated on medication times and when she can have them.  Pt offered support and encouragement and does joke with this Probation officer.   Pt remains safe on unit.

## 2016-11-29 NOTE — Progress Notes (Signed)
[facility] Group Notes:  (Nursing/MHT/Case Management/Adjunct)  Date:  11/29/2016  Time:  0930 Type of Therapy:  Nurse Education  Participation Level:  Active  Participation Quality:  Appropriate  Affect:  Flat  Cognitive:  Alert  Insight:  Limited  Engagement in Group:  Lacking  Modes of Intervention:  Activity, Discussion, Education, Socialization and Support  Summary of Progress/Problems:The purpose of this group is to educate patients on the benefits and uses of aromatherapy.    Mosie Lukes 11/29/2016, 5:07 PM

## 2016-11-29 NOTE — Progress Notes (Signed)
D: Patient endorses passive SI but contracts for safety and denies HI/AVH.   Patient has a depressed mood and anxious affect.  She is otherwise pleasant and up on the unit interacting with staff and others.  Pt. Attended morning group and participated.  Pt. Reports a fair appetite and reports sleep as fair.     A: Patient given emotional support from RN. Patient encouraged to come to staff with concerns and/or questions. Patient's medication routine continued. Patient's orders and plan of care reviewed.   R: Patient remains appropriate and cooperative. Will continue to monitor patient q15 minutes for safety.

## 2016-11-29 NOTE — Progress Notes (Signed)
[facility] MD Progress Note  11/29/2016 2:40 PM [name]  MRN:  [id number]  Subjective:  Lashunta reports, "I feel a little better today. But, I still have the suicidal thoughts, no plans because I'm here. I feel hopeful because the SW is working on helping find a half-way house to go to after discharge".  Objective: [name] is seen, chart reviewed. This case is discussed with the treatment team. She remains alert & oriented x 4. She is visible on the unit. Presents with a restricted affect & yet, says she hopeful now that the SW is working her discharge disposition. She continues to endorse suicidal ideations without plans or intent. She is attending group sessions. Currently denies any HI, AVH, delusional thoughts or paranoia. She doses not appear to be responding to any internal stimuli.  Principal Problem: Major depressive disorder, recurrent, severe.  Diagnosis:   Patient Active Problem List   Diagnosis Date Noted  . MDD (major depressive disorder), recurrent severe, without psychosis ([place]) [F33.2] 11/03/2016  . Alcohol-induced mood disorder with depressive symptoms ([place]) [F10.94] 10/26/2016  . Poor dentition [K08.9] 06/26/2016  . Essential hypertension [I10] 06/26/2016  . Hyperlipidemia [E78.5] 06/26/2016  . Elevated transaminase level [R74.0] 08/29/2015  . Health care maintenance [Z00.00] 12/31/2013  . Polysubstance abuse (h/o cocaine, ETOH, and tobacco abuse)  [F19.10]   . Primary vulvar squamous cell carcinoma s/p resection 2011 [C51.9] 02/13/2010  . 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 TOBACCO ABUSE [F17.200] 04/12/2006   Total Time spent with patient: 25 minutes  Past Psychiatric History: Alcohol use disorder, Major depression.  Past Medical History:  Past Medical History:  Diagnosis Date  . Cocaine abuse   . ETOH abuse   . Hypothyroidism   . Unspecified mood (affective) disorder [facility])     Past Surgical History:  Procedure Laterality Date  . radioactive  iodine ablation     graves disease s/p   Family History: History reviewed. No pertinent family history.  Family Psychiatric  History: See H&P  Social History:  History  Alcohol Use No    Comment: Last drink: In the last month.      History  Drug Use No    Comment: Used Crack. Last used: Earlier in the month.     Social History   Social History  . Marital status: Single    Spouse name: N/A  . Number of children: N/A  . Years of education: N/A   Social History Main Topics  . Smoking status: Current Every Day Smoker    Packs/day: 0.30    Types: Cigarettes  . Smokeless tobacco: Never Used  . Alcohol use No     Comment: Last drink: In the last month.   . Drug use: No     Comment: Used Crack. Last used: Earlier in the month.   . Sexual activity: Not Asked   Other Topics Concern  . None   Social History Narrative  . None   Additional Social History:   Sleep: Good  Appetite:  Good  Current Medications: Current Facility-Administered Medications  Medication Dose Route Frequency Provider Last Rate Last Dose  . alum & mag hydroxide-simeth (MAALOX/MYLANTA) 200-200-20 MG/5ML suspension 30 mL  30 mL Oral Q4H PRN Withrow, [name], FNP      . citalopram (CELEXA) tablet 20 mg  20 mg Oral Daily [name], FNP   20 mg at 11/29/16 6295  . hydrOXYzine (ATARAX/VISTARIL) tablet 25 mg  25 mg Oral Q6H PRN [name], FNP  25 mg at 11/29/16 0820  . ibuprofen (ADVIL,MOTRIN) tablet 600 mg  600 mg Oral Q8H PRN [name], FNP   600 mg at 11/29/16 0820  . levothyroxine (SYNTHROID, LEVOTHROID) tablet 75 mcg  75 mcg Oral Q0600 [name], FNP   75 mcg at 11/29/16 0550  . magnesium hydroxide (MILK OF MAGNESIA) suspension 30 mL  30 mL Oral Daily PRN Withrow, [name], FNP      . nicotine (NICODERM CQ - dosed in mg/24 hours) patch 21 mg  21 mg Transdermal Daily [name], FNP   21 mg at 11/29/16 0818  . ondansetron (ZOFRAN) tablet 4 mg  4 mg Oral Q8H PRN Withrow, [name],  FNP      . risperiDONE (RISPERDAL) tablet 2 mg  2 mg Oral QHS [name], FNP   2 mg at 11/28/16 2101  . traZODone (DESYREL) tablet 200 mg  200 mg Oral QHS Withrow, [name], FNP   200 mg at 11/28/16 2101   Lab Results: No results found for this or any previous visit (from the past 48 hour(s)).  Blood Alcohol level:  Lab Results  Component Value Date   ETH <5 11/26/2016   ETH 208 (H) 53/97/6734    Metabolic Disorder Labs: No results found for: HGBA1C, MPG No results found for: PROLACTIN Lab Results  Component Value Date   CHOL 191 09/12/2011   TRIG 64 09/12/2011   HDL 66 09/12/2011   CHOLHDL 2.9 09/12/2011   VLDL 13 09/12/2011   LDLCALC 112 (H) 09/12/2011   LDLCALC  12/26/2008    71        Total Cholesterol/HDL:CHD Risk Coronary Heart Disease Risk Table                     Men   Women  1/2 Average Risk   3.4   3.3  Average Risk       5.0   4.4  2 X Average Risk   9.6   7.1  3 X Average Risk  23.4   11.0        Use the calculated Patient Ratio above and the CHD Risk Table to determine the patient's CHD Risk.        ATP III CLASSIFICATION (LDL):  <100     mg/dL   Optimal  100-129  mg/dL   Near or Above                    Optimal  130-159  mg/dL   Borderline  160-189  mg/dL   High  >190     mg/dL   Very High    Physical Findings: AIMS: Facial and Oral Movements Muscles of Facial Expression: None, normal Lips and Perioral Area: None, normal Jaw: None, normal Tongue: None, normal,Extremity Movements Upper (arms, wrists, hands, fingers): None, normal Lower (legs, knees, ankles, toes): None, normal, Trunk Movements Neck, shoulders, hips: None, normal, Overall Severity Severity of abnormal movements (highest score from questions above): None, normal Incapacitation due to abnormal movements: None, normal Patient's awareness of abnormal movements (rate only patient's report): No Awareness, Dental Status Current problems with teeth and/or dentures?: No Does patient  usually wear dentures?: No  CIWA:    COWS:     Musculoskeletal: Strength & Muscle Tone: within normal limits Gait & Station: normal Patient leans: N/A  Psychiatric Specialty Exam: Physical Exam  Constitutional: She is oriented to person, place, and time. She appears well-developed.  HENT:  Head: Normocephalic.  Eyes: Pupils are equal, round, and reactive to light.  Neck: Normal range of motion.  Cardiovascular:  Elevated pulse rate  Respiratory: Effort normal.  GI: Soft.  Genitourinary:  Genitourinary Comments: Deferred  Musculoskeletal: Normal range of motion.  Neurological: She is alert and oriented to person, place, and time.  Skin: Skin is warm and dry.    Review of Systems  Constitutional: Negative.   HENT: Negative.   Eyes: Negative.   Respiratory: Negative.   Cardiovascular: Negative.   Gastrointestinal: Negative.   Genitourinary: Negative.   Skin: Negative.   Neurological: Negative.   Endo/Heme/Allergies: Negative.   Psychiatric/Behavioral: Positive for depression ("Improving") and substance abuse (Hx. Alcohol/cocaine use disorder). Negative for hallucinations, memory loss and suicidal ideas. The patient has insomnia ("improving"). The patient is not nervous/anxious.     Blood pressure 104/64, pulse (!) 102, temperature 98.1 F (36.7 C), temperature source Oral, resp. rate 18, height 5\' 1"  (1.549 m), weight 72.1 kg (159 lb).Body mass index is 30.04 kg/m.  General Appearance: Guarded  Eye Contact:  Good  Speech:  Clear and Coherent  Volume:  Decreased  Mood:  Anxious and Depressed  Affect:  Constricted and Depressed  Thought Process:  Coherent and Goal Directed  Orientation:  Full (Time, Place, and Person)  Thought Content:  Rumination  Suicidal Thoughts:  Yes.  with intent/plan  Homicidal Thoughts:  No  Memory:  Immediate;   Fair Recent;   Fair Remote;   Fair  Judgement:  Impaired  Insight:  Fair  Psychomotor Activity:  Decreased  Concentration:   Concentration: Fair and Attention Span: Fair  Recall:  Good  Fund of Knowledge:  Fair  Language:  Good  Akathisia:  Negative  Handed:  Right  AIMS (if indicated):     Assets:  Communication Skills Desire for Improvement Financial Resources/Insurance Physical Health Resilience Social Support  ADL's:  Intact  Cognition:  WNL  Sleep:  Number of Hours: 6.75     Treatment Plan Summary: Patient is at his baseline. No evidence of psychosis. No evidence of mania. No dangerousness. We are finalizing aftercare    Psychiatric: Alcoholism, chronic. Major depressive disorder.  Medical: Hypothyroidism: Continue Synthroid.  Psychosocial:  Unemployed  Hx. Alcoholism, chronic Will encourage group counseling attendance & participation.  PLAN: 1.11-29-16: No changes made on the current plan of care, continue current regimen as recommended.  Depression: Continue Citalopram 20 mg daily.  Anxiety: Hydroxyzine 25 mg prn Q 6 hours prn.  Mood control:  Risperdal 2 mg for daily at bedtime.  Nicotine Withdrawal symptoms: Continue the nicotine patch 21 mg Q 24 hours.  Insomnia. Continue Trazodone 200 mg Q bedtime.   Continue to monitor mood, behavior and interaction with peers  Lindell Spar I, NP, PMHNP, FNP-BC. 11/29/2016, 2:40 PM   Reviewed the information documented and agree with the treatment plan.  Dekisha Mesmer 11/30/2016 4:03 PM

## 2016-11-29 NOTE — Progress Notes (Signed)
Pt on unit and is cooperative and friendly.  Pt denies HI and AVH at this time.  Pt denies pain or discomfort and contracts for safety.  Pt sts she still has passive SI.   Pt medication compliant and is offered support and encouragement. Pt remains safe on unit.

## 2016-11-30 NOTE — Progress Notes (Signed)
Recreation Therapy Notes  Date:  11/30/16 Time: 0930 Location: 300 Hall Dayroom  Group Topic: Stress Management  Goal Area(s) Addresses:  Patient will verbalize importance of using healthy stress management.  Patient will identify positive emotions associated with healthy stress management.   Intervention: Stress Management  Activity :  Guided Imagery.  LRT introduced the stress management technique of guided imagery.  LRT read a script to allow patients to engage in the activity.  Patients were to follow along with as LRT read the script to participate in the activity.  Education:  Stress Management, Discharge Planning.   Education Outcome: Acknowledges edcuation/In group clarification offered/Needs additional education  Clinical Observations/Feedback: Pt did not attend group.   Victorino Sparrow, LRT/CTRS         Victorino Sparrow A 11/30/2016 11:32 AM

## 2016-11-30 NOTE — Progress Notes (Signed)
D: Pt denies SI/HI/AVH. Pt is pleasant and cooperative. Pt stated she plans to go to " [facility] or another halfway house" . Pt seen on the unit with peers  A: Pt was offered support and encouragement. Pt was given scheduled medications. Pt was encourage to attend groups. Q 15 minute checks were done for safety.   R:Pt attends groups and interacts well with peers and staff. Pt is taking medication. Pt has no complaints.Pt receptive to treatment and safety maintained on unit.

## 2016-11-30 NOTE — [facility] Suicide Risk Assessment (Signed)
[facility] INPATIENT:  Family/Significant Other Suicide Prevention Education  Suicide Prevention Education:  Patient Refusal for Family/Significant Other Suicide Prevention Education: The patient [name] has refused to provide written consent for family/significant other to be provided Family/Significant Other Suicide Prevention Education during admission and/or prior to discharge.  Physician notified.  SPE completed with pt, as pt refused to consent to family contact. SPI pamphlet provided to pt and pt was encouraged to share information with support network, ask questions, and talk about any concerns relating to SPE. Pt denies access to guns/firearms and verbalized understanding of information provided. Mobile Crisis information also provided to pt.   Gladiola Madore N Smart LCSW 11/30/2016, 2:41 PM

## 2016-11-30 NOTE — [facility] Group Notes (Signed)
[facility] LCSW Group Therapy 11/30/2016 1:15pm  Type of Therapy: Group Therapy- Feelings Around Relapse and Recovery  Participation Level: Reserved  Participation Quality:  Appropriate  Affect:  Appropriate  Cognitive: Alert and Oriented   Insight:  Developing   Engagement in Therapy: Developing/Improving and Engaged   Modes of Intervention: Clarification, Confrontation, Discussion, Education, Exploration, Limit-setting, Orientation, Problem-solving, Rapport Building, Art therapist, Socialization and Support  Summary of Progress/Problems: The topic for today was feelings about relapse. The group discussed what relapse prevention is to them and identified triggers that they are on the path to relapse. Members also processed their feeling towards relapse and were able to relate to common experiences. Group also discussed coping skills that can be used for relapse prevention.  Pt participated minimally but was attentive and engaged AEB body language and affirmative phrases.    Therapeutic Modalities:   Cognitive Behavioral Therapy Solution-Focused Therapy Assertiveness Training Relapse Prevention Therapy    [name] [phone] 11/30/2016 3:30 PM

## 2016-11-30 NOTE — Progress Notes (Signed)
Pt provided with [employer] and [facility] application. She was asked to complete and give back to a Education officer, museum. She understands that she will likely not get into [facility] program directly from hospital. Patient was also encouraged to continue calling [facility].  Maxie Better, MSW, LCSW Clinical Social Worker 11/30/2016 2:29 PM

## 2016-11-30 NOTE — Tx Team (Signed)
Interdisciplinary Treatment and Diagnostic Plan Update  11/30/2016 Time of Session: 0930 [name] MRN: [id number]  Principal Diagnosis: MDD RECURRENT SEVERE WITHOUT PSYCHOTIC FEATURES  Secondary Diagnoses: Active Problems:   MDD (major depressive disorder), recurrent severe, without psychosis ([place])   Current Medications:  Current Facility-Administered Medications  Medication Dose Route Frequency Provider Last Rate Last Dose  . alum & mag hydroxide-simeth (MAALOX/MYLANTA) 200-200-20 MG/5ML suspension 30 mL  30 mL Oral Q4H PRN Withrow, [name], FNP      . citalopram (CELEXA) tablet 20 mg  20 mg Oral Daily Withrow, [name], FNP   20 mg at 11/30/16 0827  . hydrOXYzine (ATARAX/VISTARIL) tablet 25 mg  25 mg Oral Q6H PRN [name], FNP   25 mg at 11/29/16 2149  . ibuprofen (ADVIL,MOTRIN) tablet 600 mg  600 mg Oral Q8H PRN [name], FNP   600 mg at 11/30/16 0830  . levothyroxine (SYNTHROID, LEVOTHROID) tablet 75 mcg  75 mcg Oral Q0600 [name], FNP   75 mcg at 11/30/16 0555  . magnesium hydroxide (MILK OF MAGNESIA) suspension 30 mL  30 mL Oral Daily PRN Withrow, [name], FNP      . nicotine (NICODERM CQ - dosed in mg/24 hours) patch 21 mg  21 mg Transdermal Daily [name], FNP   21 mg at 11/30/16 0827  . ondansetron (ZOFRAN) tablet 4 mg  4 mg Oral Q8H PRN Withrow, [name], FNP      . risperiDONE (RISPERDAL) tablet 2 mg  2 mg Oral QHS [name], FNP   2 mg at 11/29/16 2146  . traZODone (DESYREL) tablet 200 mg  200 mg Oral QHS Withrow, [name], FNP   200 mg at 11/29/16 2146   PTA Medications: Prescriptions Prior to Admission  Medication Sig Dispense Refill Last Dose  . citalopram (CELEXA) 20 MG tablet Take 1 tablet (20 mg total) by mouth daily. For depression 30 tablet 0 Past Week at Unknown time  . hydrOXYzine (ATARAX/VISTARIL) 25 MG tablet Take 1 tablet (25 mg total) by mouth every 6 (six) hours as needed for anxiety. 60 tablet 0 Past Week at Unknown time  .  levothyroxine (SYNTHROID, LEVOTHROID) 75 MCG tablet Take 1 tablet (75 mcg total) by mouth daily at 6 (six) AM. For low functioning thyroid 30 tablet 0 11/26/2016 at Unknown time  . nicotine (NICODERM CQ - DOSED IN MG/24 HOURS) 21 mg/24hr patch Place 1 patch (21 mg total) onto the skin daily. For smoking cessation 28 patch 0 11/25/2016 at Unknown time  . risperiDONE (RISPERDAL) 2 MG tablet Take 1 tablet (2 mg total) by mouth at bedtime. For mood control 30 tablet 0 11/25/2016 at Unknown time  . traZODone (DESYREL) 100 MG tablet Take 2 tablets (200 mg total) by mouth at bedtime. For sleep 60 tablet 0 11/25/2016 at Unknown time    Patient Stressors: Financial difficulties Loss of housing  Patient Strengths: Average or above average intelligence Communication skills Motivation for treatment/growth Supportive family/friends  Treatment Modalities: Medication Management, Group therapy, Case management,  1 to 1 session with clinician, Psychoeducation, Recreational therapy.   Physician Treatment Plan for Primary Diagnosis: MDD RECURRENT SEVERE WITHOUT PSYCHOTIC FEATURES Long Term Goal(s): Improvement in symptoms so as ready for discharge Improvement in symptoms so as ready for discharge   Short Term Goals: Ability to identify changes in lifestyle to reduce recurrence of condition will improve Ability to verbalize feelings will improve Ability to disclose and discuss suicidal ideas  Ability to demonstrate self-control will improve Ability to identify and develop effective coping behaviors will improve Ability to maintain clinical measurements within normal limits will improve Ability to identify triggers associated with substance abuse/mental health issues will improve  Medication Management: Evaluate patient's response, side effects, and tolerance of medication regimen.  Therapeutic Interventions: 1 to 1 sessions, Unit Group sessions and Medication administration.  Evaluation of Outcomes:  Progressing  Physician Treatment Plan for Secondary Diagnosis: Active Problems:   MDD (major depressive disorder), recurrent severe, without psychosis ([place])  Long Term Goal(s): Improvement in symptoms so as ready for discharge Improvement in symptoms so as ready for discharge   Short Term Goals: Ability to identify changes in lifestyle to reduce recurrence of condition will improve Ability to verbalize feelings will improve Ability to disclose and discuss suicidal ideas Ability to demonstrate self-control will improve Ability to identify and develop effective coping behaviors will improve Ability to maintain clinical measurements within normal limits will improve Ability to identify triggers associated with substance abuse/mental health issues will improve     Medication Management: Evaluate patient's response, side effects, and tolerance of medication regimen.  Therapeutic Interventions: 1 to 1 sessions, Unit Group sessions and Medication administration.  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 RN will report any adverse and/or side effects to prescribing provider.  Therapeutic Interventions: 1 on 1 counseling sessions, Psychoeducation, Medication administration, Evaluate responses to treatment, Monitor vital signs and CBGs as ordered, Perform/monitor CIWA, COWS, AIMS and Fall Risk screenings as ordered, Perform wound care treatments as ordered.  Evaluation of  Outcomes: Progressing   LCSW Treatment Plan for Primary Diagnosis: MDD RECURRENT SEVERE WITHOUT PSYCHOTIC FEATURES Long Term Goal(s): Safe transition to appropriate next level of care at discharge, Engage patient in therapeutic group addressing interpersonal concerns.  Short Term Goals: Engage patient in aftercare planning with referrals and resources, Facilitate patient progression through stages of change regarding substance use diagnoses and concerns and Identify triggers associated with mental health/substance abuse issues  Therapeutic Interventions: Assess for all discharge needs, 1 to 1 time with Social worker, Explore available resources and support systems, Assess for adequacy in community support network, Educate family and significant other(s) on suicide prevention, Complete Psychosocial Assessment, Interpersonal group therapy.  Evaluation of Outcomes: Progressing   Progress in Treatment: Attending groups: Yes. Participating in groups: Yes. Taking medication as prescribed: Yes. Toleration medication: Yes. Family/Significant other contact made: SPe completed with pt; pt declined to consent to family contact.  Patient understands diagnosis: Yes. Discussing patient identified problems/goals with staff: Yes. Medical problems stabilized or resolved: Yes. Denies suicidal/homicidal ideation: Yes. Issues/concerns per patient self-inventory: No. Other: n/a   New problem(s) identified: No, Describe:  n/a   New Short Term/Long Term Goal(s): none identified   Discharge Plan or Barriers: pt has been given [facility]; [facility] application, [facility] pamphlet, mental health association of [place] pamphlet. She was encouraged to call [facility]. Pt also referred to [facility] for [facility].   Reason for Continuation of Hospitalization: Depression Medication stabilization Withdrawal symptoms  Estimated Length of  Stay: 2-3 days   Attendees: Patient: 11/30/2016 2:38 PM   Physician: Dr. Dwyane Dee MD 11/30/2016 2:38 PM  Nursing: Bryna Colander RN 11/30/2016 2:38 PM  RN Care Manager: Lars Pinks CM 11/30/2016 2:38 PM  Social Worker: Maxie Better, LCSW 11/30/2016 2:38 PM  Recreational Therapist: x 11/30/2016 2:38 PM  Other: Lindell Spar NP; Ricky Ala NP 11/30/2016 2:38 PM  Other:  11/30/2016 2:38 PM  Other: 11/30/2016 2:38 PM    Scribe for Treatment Team: [place], LCSW 11/30/2016 2:38 PM

## 2016-11-30 NOTE — Social Work (Signed)
Patient has care coordinator, Mayer Camel [phone]).  Edwyna Shell, LCSW Lead Clinical Social Worker Phone:  [phone]

## 2016-11-30 NOTE — Progress Notes (Signed)
D [name] has had a good day today. She has spent much ofher day out in the milieu...interacting with her peers on the 300 hall. She is pleasant and cooperative upon approach by this Probation officer. A SHe completed her daily assessment and on this she wrote she did have SI today and she contracted with this writer  to not hurt herself today. She has spoken to her SW about her dc plans and is waiting to have SW her tomorrow call potential home for collateral info. R Safety in place.

## 2016-11-30 NOTE — Progress Notes (Signed)
[facility] MD Progress Note  11/30/2016 4:37 PM [name]  MRN:  [id number]  Subjective: [name] reports, " I felt suicidal in the morning after I woke up. But, I'm doing okay now. I contacted a half-way house that talked to me today. I'm happy about that. I waiting for the Social worker to talk this half way house & confirmed that really will take me".  Objective: [name] is seen, chart reviewed. This case is discussed with the treatment team. She remains alert & oriented x 4. She is visible on the unit. Presents with a more improved affect Today. She remains hopeful now that the SW is working her discharge disposition. She says she was able to contact one half-way house today. She continues to endorse passive suicidal ideations without plans or intent. She is attending group sessions. Currently denies any HI, AVH, delusional thoughts or paranoia. She doses not appear to be responding to any internal stimuli. [name] continues to require support.  Principal Problem: Major depressive disorder, recurrent, severe.  Diagnosis:   Patient Active Problem List   Diagnosis Date Noted  . MDD (major depressive disorder), recurrent severe, without psychosis ([place]) [F33.2] 11/03/2016  . Alcohol-induced mood disorder with depressive symptoms ([place]) [F10.94] 10/26/2016  . Poor dentition [K08.9] 06/26/2016  . Essential hypertension [I10] 06/26/2016  . Hyperlipidemia [E78.5] 06/26/2016  . Elevated transaminase level [R74.0] 08/29/2015  . Health care maintenance [Z00.00] 12/31/2013  . Polysubstance abuse (h/o cocaine, ETOH, and tobacco abuse)  [F19.10]   . Primary vulvar squamous cell carcinoma s/p resection 2011 [C51.9] 02/13/2010  . 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 TOBACCO ABUSE [F17.200] 04/12/2006   Total Time spent with patient: 15 minutes  Past Psychiatric History: Alcohol use disorder, Major depression.  Past Medical History:  Past Medical History:  Diagnosis Date  . Cocaine abuse    . ETOH abuse   . Hypothyroidism   . Unspecified mood (affective) disorder [facility])     Past Surgical History:  Procedure Laterality Date  . radioactive iodine ablation     graves disease s/p   Family History: History reviewed. No pertinent family history.  Family Psychiatric  History: See H&P  Social History:  History  Alcohol Use No    Comment: Last drink: In the last month.      History  Drug Use No    Comment: Used Crack. Last used: Earlier in the month.     Social History   Social History  . Marital status: Single    Spouse name: N/A  . Number of children: N/A  . Years of education: N/A   Social History Main Topics  . Smoking status: Current Every Day Smoker    Packs/day: 0.30    Types: Cigarettes  . Smokeless tobacco: Never Used  . Alcohol use No     Comment: Last drink: In the last month.   . Drug use: No     Comment: Used Crack. Last used: Earlier in the month.   . Sexual activity: Not Asked   Other Topics Concern  . None   Social History Narrative  . None   Additional Social History:   Sleep: Good  Appetite:  Good  Current Medications: Current Facility-Administered Medications  Medication Dose Route Frequency Provider Last Rate Last Dose  . alum & mag hydroxide-simeth (MAALOX/MYLANTA) 200-200-20 MG/5ML suspension 30 mL  30 mL Oral Q4H PRN Withrow, [name], FNP      . citalopram (CELEXA) tablet 20 mg  20 mg Oral Daily  [name], FNP   20 mg at 11/30/16 0827  . hydrOXYzine (ATARAX/VISTARIL) tablet 25 mg  25 mg Oral Q6H PRN [name], FNP   25 mg at 11/29/16 2149  . ibuprofen (ADVIL,MOTRIN) tablet 600 mg  600 mg Oral Q8H PRN [name], FNP   600 mg at 11/30/16 1633  . levothyroxine (SYNTHROID, LEVOTHROID) tablet 75 mcg  75 mcg Oral Q0600 [name], FNP   75 mcg at 11/30/16 0555  . magnesium hydroxide (MILK OF MAGNESIA) suspension 30 mL  30 mL Oral Daily PRN Withrow, [name], FNP      . nicotine (NICODERM CQ - dosed in mg/24  hours) patch 21 mg  21 mg Transdermal Daily [name], FNP   21 mg at 11/30/16 0827  . ondansetron (ZOFRAN) tablet 4 mg  4 mg Oral Q8H PRN Withrow, [name], FNP      . risperiDONE (RISPERDAL) tablet 2 mg  2 mg Oral QHS [name], FNP   2 mg at 11/29/16 2146  . traZODone (DESYREL) tablet 200 mg  200 mg Oral QHS Withrow, [name], FNP   200 mg at 11/29/16 2146   Lab Results: No results found for this or any previous visit (from the past 48 hour(s)).  Blood Alcohol level:  Lab Results  Component Value Date   ETH <5 11/26/2016   ETH 208 (H) 67/61/9509    Metabolic Disorder Labs: No results found for: HGBA1C, MPG No results found for: PROLACTIN Lab Results  Component Value Date   CHOL 191 09/12/2011   TRIG 64 09/12/2011   HDL 66 09/12/2011   CHOLHDL 2.9 09/12/2011   VLDL 13 09/12/2011   LDLCALC 112 (H) 09/12/2011   LDLCALC  12/26/2008    71        Total Cholesterol/HDL:CHD Risk Coronary Heart Disease Risk Table                     Men   Women  1/2 Average Risk   3.4   3.3  Average Risk       5.0   4.4  2 X Average Risk   9.6   7.1  3 X Average Risk  23.4   11.0        Use the calculated Patient Ratio above and the CHD Risk Table to determine the patient's CHD Risk.        ATP III CLASSIFICATION (LDL):  <100     mg/dL   Optimal  100-129  mg/dL   Near or Above                    Optimal  130-159  mg/dL   Borderline  160-189  mg/dL   High  >190     mg/dL   Very High    Physical Findings: AIMS: Facial and Oral Movements Muscles of Facial Expression: None, normal Lips and Perioral Area: None, normal Jaw: None, normal Tongue: None, normal,Extremity Movements Upper (arms, wrists, hands, fingers): None, normal Lower (legs, knees, ankles, toes): None, normal, Trunk Movements Neck, shoulders, hips: None, normal, Overall Severity Severity of abnormal movements (highest score from questions above): None, normal Incapacitation due to abnormal movements: None,  normal Patient's awareness of abnormal movements (rate only patient's report): No Awareness, Dental Status Current problems with teeth and/or dentures?: No Does patient usually wear dentures?: No  CIWA:    COWS:     Musculoskeletal: Strength & Muscle  Tone: within normal limits Gait & Station: normal Patient leans: N/A  Psychiatric Specialty Exam: Physical Exam  Constitutional: She is oriented to person, place, and time. She appears well-developed.  HENT:  Head: Normocephalic.  Eyes: Pupils are equal, round, and reactive to light.  Neck: Normal range of motion.  Cardiovascular:  Elevated pulse rate  Respiratory: Effort normal.  GI: Soft.  Genitourinary:  Genitourinary Comments: Deferred  Musculoskeletal: Normal range of motion.  Neurological: She is alert and oriented to person, place, and time.  Skin: Skin is warm and dry.    Review of Systems  Constitutional: Negative.   HENT: Negative.   Eyes: Negative.   Respiratory: Negative.   Cardiovascular: Negative.   Gastrointestinal: Negative.   Genitourinary: Negative.   Skin: Negative.   Neurological: Negative.   Endo/Heme/Allergies: Negative.   Psychiatric/Behavioral: Positive for depression ("Improving") and substance abuse (Hx. Alcohol/cocaine use disorder). Negative for hallucinations, memory loss and suicidal ideas. The patient has insomnia ("improving"). The patient is not nervous/anxious.     Blood pressure 115/66, pulse 89, temperature 97.6 F (36.4 C), temperature source Oral, resp. rate 16, height 5\' 1"  (1.549 m), weight 72.1 kg (159 lb).Body mass index is 30.04 kg/m.  General Appearance: Guarded  Eye Contact:  Good  Speech:  Clear and Coherent  Volume:  Decreased  Mood:  Anxious and Depressed  Affect:  Constricted and Depressed  Thought Process:  Coherent and Goal Directed  Orientation:  Full (Time, Place, and Person)  Thought Content:  Rumination  Suicidal Thoughts:  Yes.  with intent/plan  Homicidal  Thoughts:  No  Memory:  Immediate;   Fair Recent;   Fair Remote;   Fair  Judgement:  Impaired  Insight:  Fair  Psychomotor Activity:  Decreased  Concentration:  Concentration: Fair and Attention Span: Fair  Recall:  Good  Fund of Knowledge:  Fair  Language:  Good  Akathisia:  Negative  Handed:  Right  AIMS (if indicated):     Assets:  Communication Skills Desire for Improvement Financial Resources/Insurance Physical Health Resilience Social Support  ADL's:  Intact  Cognition:  WNL  Sleep:  Number of Hours: 6.75     Treatment Plan Summary: Patient is at his baseline. No evidence of psychosis. No evidence of mania. No dangerousness. We are finalizing aftercare    Psychiatric: Alcoholism, chronic. Major depressive disorder.  Medical: Hypothyroidism: Continue Synthroid.  Psychosocial:  Unemployed  Hx. Alcoholism, chronic Will encourage group counseling attendance & participation.  PLAN: 1.11-30-16: No changes made on the current plan of care, continue current regimen as recommended.  Depression: Continue Citalopram 20 mg daily.  Anxiety: Hydroxyzine 25 mg prn Q 6 hours prn.  Mood control:  Risperdal 2 mg for daily at bedtime.  Nicotine Withdrawal symptoms: Continue the nicotine patch 21 mg Q 24 hours.  Insomnia. Continue Trazodone 200 mg Q bedtime.   Continue to monitor mood, behavior and interaction with peers  Encarnacion Slates, PMHNP, FNP-BC. 11/30/2016 4:37 PMPatient ID: Harmon Dun, female   DOB: April 24, 1961, 56 y.o.   MRN: [id number]

## 2016-12-01 DIAGNOSIS — F39 Unspecified mood [affective] disorder: Secondary | ICD-10-CM

## 2016-12-01 DIAGNOSIS — G4700 Insomnia, unspecified: Secondary | ICD-10-CM

## 2016-12-01 DIAGNOSIS — F17213 Nicotine dependence, cigarettes, with withdrawal: Secondary | ICD-10-CM

## 2016-12-01 DIAGNOSIS — F419 Anxiety disorder, unspecified: Secondary | ICD-10-CM

## 2016-12-01 MED ORDER — CITALOPRAM HYDROBROMIDE 40 MG PO TABS
40.0000 mg | ORAL_TABLET | Freq: Every day | ORAL | Status: DC
  Administered 2016-12-02 – 2016-12-09 (×8): 40 mg via ORAL
  Filled 2016-12-01 (×2): qty 7
  Filled 2016-12-01 (×2): qty 1
  Filled 2016-12-01: qty 7
  Filled 2016-12-01 (×3): qty 1
  Filled 2016-12-01 (×2): qty 7

## 2016-12-01 NOTE — Progress Notes (Signed)
[facility] MD Progress Note  12/01/2016 4:45 PM [name]  MRN:  [id number] Subjective: patient reports " I feeling okay, I feel ready to discharge to a halfway house on Monday." reports she has attempted to call [facility] and [place].  Objective: [name] is awake, alert and oriented. Seen resting in dayroom.  Denies suicidal or homicidal ideation during this assessment. Denies auditory or visual hallucination and does not appear to be responding to internal stimuli.  Patient reports he is medication compliant without mediation side effects.  States her depression 8/10. Reports good appetite and states she is resting well. Patient report she is excited regarding discharge on Monday. Support, encouragement and reassurance was provided.   Principal Problem: MDD (major depressive disorder), recurrent severe, without psychosis ([place]) Diagnosis:   Patient Active Problem List   Diagnosis Date Noted  . MDD (major depressive disorder), recurrent severe, without psychosis ([place]) [F33.2] 11/03/2016  . Alcohol-induced mood disorder with depressive symptoms ([place]) [F10.94] 10/26/2016  . Poor dentition [K08.9] 06/26/2016  . Essential hypertension [I10] 06/26/2016  . Hyperlipidemia [E78.5] 06/26/2016  . Elevated transaminase level [R74.0] 08/29/2015  . Health care maintenance [Z00.00] 12/31/2013  . Polysubstance abuse (h/o cocaine, ETOH, and tobacco abuse)  [F19.10]   . Primary vulvar squamous cell carcinoma s/p resection 2011 [C51.9] 02/13/2010  . 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 TOBACCO ABUSE [F17.200] 04/12/2006   Total Time spent with patient: 30 minutes  Past Psychiatric History:   Past Medical History:  Past Medical History:  Diagnosis Date  . Cocaine abuse   . ETOH abuse   . Hypothyroidism   . Unspecified mood (affective) disorder [facility])     Past Surgical History:  Procedure Laterality Date  . radioactive iodine ablation     graves disease s/p   Family  History: History reviewed. No pertinent family history. Family Psychiatric  History: Social History:  History  Alcohol Use No    Comment: Last drink: In the last month.      History  Drug Use No    Comment: Used Crack. Last used: Earlier in the month.     Social History   Social History  . Marital status: Single    Spouse name: N/A  . Number of children: N/A  . Years of education: N/A   Social History Main Topics  . Smoking status: Current Every Day Smoker    Packs/day: 0.30    Types: Cigarettes  . Smokeless tobacco: Never Used  . Alcohol use No     Comment: Last drink: In the last month.   . Drug use: No     Comment: Used Crack. Last used: Earlier in the month.   . Sexual activity: Not Asked   Other Topics Concern  . None   Social History Narrative  . None   Additional Social History:                         Sleep: Fair  Appetite:  Fair  Current Medications: Current Facility-Administered Medications  Medication Dose Route Frequency Provider Last Rate Last Dose  . alum & mag hydroxide-simeth (MAALOX/MYLANTA) 200-200-20 MG/5ML suspension 30 mL  30 mL Oral Q4H PRN Withrow, [name], FNP      . [START ON 12/02/2016] citalopram (CELEXA) tablet 40 mg  40 mg Oral Daily Derrill Center, NP      . hydrOXYzine (ATARAX/VISTARIL) tablet 25 mg  25 mg Oral Q6H PRN Withrow, [name],  FNP   25 mg at 11/30/16 2129  . ibuprofen (ADVIL,MOTRIN) tablet 600 mg  600 mg Oral Q8H PRN [name], FNP   600 mg at 12/01/16 0806  . levothyroxine (SYNTHROID, LEVOTHROID) tablet 75 mcg  75 mcg Oral Q0600 [name], FNP   75 mcg at 12/01/16 8588  . magnesium hydroxide (MILK OF MAGNESIA) suspension 30 mL  30 mL Oral Daily PRN Withrow, [name], FNP      . nicotine (NICODERM CQ - dosed in mg/24 hours) patch 21 mg  21 mg Transdermal Daily [name], FNP   21 mg at 12/01/16 5027  . ondansetron (ZOFRAN) tablet 4 mg  4 mg Oral Q8H PRN Withrow, [name], FNP      . risperiDONE  (RISPERDAL) tablet 2 mg  2 mg Oral QHS [name], FNP   2 mg at 11/30/16 2129  . traZODone (DESYREL) tablet 200 mg  200 mg Oral QHS Withrow, [name], FNP   200 mg at 11/30/16 2129    Lab Results: No results found for this or any previous visit (from the past 48 hour(s)).  Blood Alcohol level:  Lab Results  Component Value Date   ETH <5 11/26/2016   ETH 208 (H) 74/06/8785    Metabolic Disorder Labs: No results found for: HGBA1C, MPG No results found for: PROLACTIN Lab Results  Component Value Date   CHOL 191 09/12/2011   TRIG 64 09/12/2011   HDL 66 09/12/2011   CHOLHDL 2.9 09/12/2011   VLDL 13 09/12/2011   LDLCALC 112 (H) 09/12/2011   LDLCALC  12/26/2008    71        Total Cholesterol/HDL:CHD Risk Coronary Heart Disease Risk Table                     Men   Women  1/2 Average Risk   3.4   3.3  Average Risk       5.0   4.4  2 X Average Risk   9.6   7.1  3 X Average Risk  23.4   11.0        Use the calculated Patient Ratio above and the CHD Risk Table to determine the patient's CHD Risk.        ATP III CLASSIFICATION (LDL):  <100     mg/dL   Optimal  100-129  mg/dL   Near or Above                    Optimal  130-159  mg/dL   Borderline  160-189  mg/dL   High  >190     mg/dL   Very High    Physical Findings: AIMS: Facial and Oral Movements Muscles of Facial Expression: None, normal Lips and Perioral Area: None, normal Jaw: None, normal Tongue: None, normal,Extremity Movements Upper (arms, wrists, hands, fingers): None, normal Lower (legs, knees, ankles, toes): None, normal, Trunk Movements Neck, shoulders, hips: None, normal, Overall Severity Severity of abnormal movements (highest score from questions above): None, normal Incapacitation due to abnormal movements: None, normal Patient's awareness of abnormal movements (rate only patient's report): No Awareness, Dental Status Current problems with teeth and/or dentures?: No Does patient usually wear  dentures?: No  CIWA:    COWS:     Musculoskeletal: Strength & Muscle Tone: within normal limits Gait & Station: normal Patient leans: N/A  Psychiatric Specialty Exam: Physical Exam  Vitals reviewed. Constitutional: She is oriented to person,  place, and time. She appears well-developed.  Cardiovascular: Normal rate.   Neurological: She is alert and oriented to person, place, and time.  Psychiatric: She has a normal mood and affect. Her behavior is normal.    Review of Systems  Psychiatric/Behavioral: Positive for depression. The patient is nervous/anxious.     Blood pressure 115/64, pulse 94, temperature 97.6 F (36.4 C), temperature source Oral, resp. rate 16, height 5\' 1"  (1.549 m), weight 72.1 kg (159 lb).Body mass index is 30.04 kg/m.  General Appearance: Guarded  Eye Contact:  Good  Speech:  Clear and Coherent  Volume:  Normal  Mood:  Anxious and Depressed  Affect:  Congruent  Thought Process:  Coherent  Orientation:  Full (Time, Place, and Person)  Thought Content:  Hallucinations: None and Rumination  Suicidal Thoughts:  No  Homicidal Thoughts:  No  Memory:  Immediate;   Fair Recent;   Fair Remote;   Fair  Judgement:  Fair  Insight:  Fair  Psychomotor Activity:  Normal  Concentration:  Concentration: Fair and Attention Span: Fair  Recall:  [employer] of Knowledge:  Fair  Language:  Fair  Akathisia:  No  Handed:  Right  AIMS (if indicated):     Assets:  Communication Skills Desire for Improvement Resilience Social Support  ADL's:  Intact  Cognition:  WNL  Sleep:  Number of Hours: 6.25     I agree with current treatment plan on 12/01/2016, Patient seen face-to-face for psychiatric evaluation follow-up, chart reviewed. Reviewed the information documented and agree with the treatment plan.  Treatment Plan Summary: Daily contact with patient to assess and evaluate symptoms and progress in treatment and Medication management   Psychiatric: Alcoholism,  chronic. Major depressive disorder.  Medical: Hypothyroidism: Continue Synthroid.  Psychosocial:  Unemployed  Hx. Alcoholism, chronic Will encourage group counseling attendance & participation.  PLAN: 1.12-01-16: No changes made on the current plan of care, continue current regimen as recommended.  Depression: -Increased  Citalopram 20 mg to 40mg  daily.  Anxiety: Hydroxyzine 25 mg prn Q 6 hours prn.  Mood control:  Risperdal 2 mg for daily at bedtime.  Nicotine Withdrawal symptoms: Continue the nicotine patch 21 mg Q 24 hours.  Insomnia. Continue Trazodone 200 mg Q bedtime. Continue to monitor mood, behavior and interaction with peers  Derrill Center, NP 12/01/2016, 4:45 PM

## 2016-12-01 NOTE — [facility] Group Notes (Signed)
[place] Group Notes:  (Nursing/MHT/Case Management/Adjunct)  Date:  12/01/2016  Time:  3:02 PM  Type of Therapy:  Nurse Education  Participation Level:  Active  Participation Quality:  Appropriate  Affect:  Appropriate  Cognitive:  Appropriate  Insight:  Appropriate  Engagement in Group:  Engaged  Modes of Intervention:  Discussion and Education  Summary of Progress/Problems:  This was a healthy coping skills development group.   Cheri Kearns 12/01/2016, 3:02 PM

## 2016-12-01 NOTE — Progress Notes (Signed)
Patient did attend the evening speaker AA meeting.  

## 2016-12-01 NOTE — [facility] Group Notes (Addendum)
[place] LCSW Group Therapy Note  12/01/2016  and  10:00 AM  Type of Therapy and Topic:  Group Therapy: Avoiding Self-Sabotaging and Enabling Behaviors  Participation Level:  Active  Participation Quality:  Attentive  Affect:  Depressed and flat  Cognitive:  Alert   Insight:  None Noted  Engagement in Therapy:  Lacking   Therapeutic models used: Cognitive Behavioral Therapy,  Person-Centered Therapy and Motivational Interviewing  Modes of Intervention:  Discussion, Exploration, Orientation, Rapport Building, Socialization and Support  Summary of Progress/Problems:  The main focus of today's process group was for the patient to identify ways in which they have in the past sabotaged their own recovery. Motivational Interviewing was utilized to identify motivation they may have for wanting to change. Patient was attentive   Sheilah Pigeon, LCSW

## 2016-12-01 NOTE — Progress Notes (Signed)
D.  Pt pleasant on approach, no complaints voiced.  Pt was positive for evening AA group, observed engaged in appropriate interaction with peers on the unit.  Pt denies SI/HI/hallucinations at this time and reports looking forward to discharge on Monday.  A.  Support and encouragement offered, medication given as ordered  R.  Pt remains safe on the unit, will continue to monitor.

## 2016-12-01 NOTE — Progress Notes (Signed)
Pt.attended last evening's A.A.meeting and was appropriate.

## 2016-12-01 NOTE — Progress Notes (Signed)
D: Pt presents animated and anxious on initial contact. A & O X4. Denies SI, HI and AVH. Observed interacting well with peers and staff. Rates her anxiety 4/10 "I'm just ready to go to the treatment program, they told me I'm going to [facility]" hopelessness 0/10 Idepression 0/10 when assessed.  A: Scheduled and PRN (Motrin--back pain) medications administered as prescribed and effects monitored. Support and availability provided to pt. Writer encouraged pt to voice concerns and comply with current treatment regimen including groups. Q 15 minutes safety checks maintained without self harm gestures or outburst to note at this time.  R: Pt took her medications without issues. Denies adverse drug reactions when assessed. Attended scheduled unit groups and was engaged. Went off unit for activities and meals with peers; returned without issues. POC effective for safety and mood stability.

## 2016-12-02 NOTE — Progress Notes (Signed)
Patient ID: [name], female   DOB: 12/26/1960, 56 y.o.   MRN: [id number]  DAR: Pt. Denies HI and A/V Hallucinations. Patient reports passive SI but is able to contract for safety. She reports sleep is fair, appetite is fair, energy level is low, and concentration is poor. She rates depression 5/10, hopelessness 4/10, and anxiety 4/10. She reports some pain in her legs and received PRN medication which provided relief.Support and encouragement provided to the patient. Scheduled medications administered to patient per physician's orders. Patient is minimal with Probation officer but does reports that she is worried about her discharge plan. She reports, "I really want to go to [facility]." Patient is seen in the milieu and mainly seen in the dayroom relaxing and reading a magazine. Q15 minute checks are maintained for safety.

## 2016-12-02 NOTE — Progress Notes (Signed)
[facility] MD Progress Note  12/02/2016 11:50 AM [name]  MRN:  [id number]   Subjective: patient reports "I doing good today,  I really hope my application for the [facility] program get approved.".  Objective: [name] seen attending group session.  Denies suicidal or homicidal ideation during this assessment. Denies auditory or visual hallucination and does not appear to be responding to internal stimuli. Patient appears future oriented with long term treatment facility. Support, encouragement and reassurance was provided.   Principal Problem: MDD (major depressive disorder), recurrent severe, without psychosis ([place]) Diagnosis:   Patient Active Problem List   Diagnosis Date Noted  . MDD (major depressive disorder), recurrent severe, without psychosis ([place]) [F33.2] 11/03/2016  . Alcohol-induced mood disorder with depressive symptoms ([place]) [F10.94] 10/26/2016  . Poor dentition [K08.9] 06/26/2016  . Essential hypertension [I10] 06/26/2016  . Hyperlipidemia [E78.5] 06/26/2016  . Elevated transaminase level [R74.0] 08/29/2015  . Health care maintenance [Z00.00] 12/31/2013  . Polysubstance abuse (h/o cocaine, ETOH, and tobacco abuse)  [F19.10]   . Primary vulvar squamous cell carcinoma s/p resection 2011 [C51.9] 02/13/2010  . 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 TOBACCO ABUSE [F17.200] 04/12/2006   Total Time spent with patient: 30 minutes  Past Psychiatric History:   Past Medical History:  Past Medical History:  Diagnosis Date  . Cocaine abuse   . ETOH abuse   . Hypothyroidism   . Unspecified mood (affective) disorder [facility])     Past Surgical History:  Procedure Laterality Date  . radioactive iodine ablation     graves disease s/p   Family History: History reviewed. No pertinent family history. Family Psychiatric  History: Social History:  History  Alcohol Use No    Comment: Last drink: In the last month.      History  Drug Use No    Comment: Used Crack.  Last used: Earlier in the month.     Social History   Social History  . Marital status: Single    Spouse name: N/A  . Number of children: N/A  . Years of education: N/A   Social History Main Topics  . Smoking status: Current Every Day Smoker    Packs/day: 0.30    Types: Cigarettes  . Smokeless tobacco: Never Used  . Alcohol use No     Comment: Last drink: In the last month.   . Drug use: No     Comment: Used Crack. Last used: Earlier in the month.   . Sexual activity: Not Asked   Other Topics Concern  . None   Social History Narrative  . None   Additional Social History:                         Sleep: Fair  Appetite:  Fair  Current Medications: Current Facility-Administered Medications  Medication Dose Route Frequency Provider Last Rate Last Dose  . alum & mag hydroxide-simeth (MAALOX/MYLANTA) 200-200-20 MG/5ML suspension 30 mL  30 mL Oral Q4H PRN Withrow, [name], FNP      . citalopram (CELEXA) tablet 40 mg  40 mg Oral Daily Derrill Center, NP   40 mg at 12/02/16 0807  . hydrOXYzine (ATARAX/VISTARIL) tablet 25 mg  25 mg Oral Q6H PRN [name], FNP   25 mg at 12/01/16 2108  . ibuprofen (ADVIL,MOTRIN) tablet 600 mg  600 mg Oral Q8H PRN [name], FNP   600 mg at 12/02/16 3762  . levothyroxine (SYNTHROID, LEVOTHROID) tablet  75 mcg  75 mcg Oral Q0600 [name], FNP   75 mcg at 12/02/16 2426  . magnesium hydroxide (MILK OF MAGNESIA) suspension 30 mL  30 mL Oral Daily PRN Withrow, [name], FNP      . nicotine (NICODERM CQ - dosed in mg/24 hours) patch 21 mg  21 mg Transdermal Daily [name], FNP   21 mg at 12/02/16 0807  . ondansetron (ZOFRAN) tablet 4 mg  4 mg Oral Q8H PRN Withrow, [name], FNP      . risperiDONE (RISPERDAL) tablet 2 mg  2 mg Oral QHS [name], FNP   2 mg at 12/01/16 2107  . traZODone (DESYREL) tablet 200 mg  200 mg Oral QHS Withrow, [name], FNP   200 mg at 12/01/16 2106    Lab Results: No results found for this or  any previous visit (from the past 48 hour(s)).  Blood Alcohol level:  Lab Results  Component Value Date   ETH <5 11/26/2016   ETH 208 (H) 83/41/9622    Metabolic Disorder Labs: No results found for: HGBA1C, MPG No results found for: PROLACTIN Lab Results  Component Value Date   CHOL 191 09/12/2011   TRIG 64 09/12/2011   HDL 66 09/12/2011   CHOLHDL 2.9 09/12/2011   VLDL 13 09/12/2011   LDLCALC 112 (H) 09/12/2011   LDLCALC  12/26/2008    71        Total Cholesterol/HDL:CHD Risk Coronary Heart Disease Risk Table                     Men   Women  1/2 Average Risk   3.4   3.3  Average Risk       5.0   4.4  2 X Average Risk   9.6   7.1  3 X Average Risk  23.4   11.0        Use the calculated Patient Ratio above and the CHD Risk Table to determine the patient's CHD Risk.        ATP III CLASSIFICATION (LDL):  <100     mg/dL   Optimal  100-129  mg/dL   Near or Above                    Optimal  130-159  mg/dL   Borderline  160-189  mg/dL   High  >190     mg/dL   Very High    Physical Findings: AIMS: Facial and Oral Movements Muscles of Facial Expression: None, normal Lips and Perioral Area: None, normal Jaw: None, normal Tongue: None, normal,Extremity Movements Upper (arms, wrists, hands, fingers): None, normal Lower (legs, knees, ankles, toes): None, normal, Trunk Movements Neck, shoulders, hips: None, normal, Overall Severity Severity of abnormal movements (highest score from questions above): None, normal Incapacitation due to abnormal movements: None, normal Patient's awareness of abnormal movements (rate only patient's report): No Awareness, Dental Status Current problems with teeth and/or dentures?: No Does patient usually wear dentures?: No  CIWA:  CIWA-Ar Total: 1 COWS:  COWS Total Score: 1  Musculoskeletal: Strength & Muscle Tone: within normal limits Gait & Station: normal Patient leans: N/A  Psychiatric Specialty Exam: Physical Exam  Vitals  reviewed. Constitutional: She is oriented to person, place, and time. She appears well-developed.  Cardiovascular: Normal rate.   Neurological: She is alert and oriented to person, place, and time.  Skin: Skin is warm and dry.  Psychiatric: She has a  normal mood and affect. Her behavior is normal.    Review of Systems  Psychiatric/Behavioral: Positive for depression, hallucinations and substance abuse. Negative for suicidal ideas. The patient is nervous/anxious.     Blood pressure 115/65, pulse 89, temperature 98.7 F (37.1 C), temperature source Oral, resp. rate 20, height 5\' 1"  (1.549 m), weight 72.1 kg (159 lb).Body mass index is 30.04 kg/m.  General Appearance: Casual paper scrubs   Eye Contact:  Good  Speech:  Clear and Coherent  Volume:  Normal  Mood:  Anxious and Depressed  Affect:  Congruent  Thought Process:  Coherent  Orientation:  Full (Time, Place, and Person)  Thought Content:  Hallucinations: None and Rumination  Suicidal Thoughts:  No  Homicidal Thoughts:  No  Memory:  Immediate;   Fair Recent;   Fair Remote;   Fair  Judgement:  Fair  Insight:  Fair  Psychomotor Activity:  Normal  Concentration:  Concentration: Fair and Attention Span: Fair  Recall:  [employer] of Knowledge:  Fair  Language:  Fair  Akathisia:  No  Handed:  Right  AIMS (if indicated):     Assets:  Communication Skills Desire for Improvement Resilience Social Support  ADL's:  Intact  Cognition:  WNL  Sleep:  Number of Hours: 6.5     I agree with current treatment plan on 12/02/2016, Patient seen face-to-face for psychiatric evaluation follow-up, chart reviewed. Reviewed the information documented and agree with the treatment plan.  Treatment Plan Summary: Daily contact with patient to assess and evaluate symptoms and progress in treatment and Medication management   Continue with current treatment plan except where noted on 12/02/2016  Psychiatric: Alcoholism, chronic. Major  depressive disorder.  Medical: Hypothyroidism: Continue Synthroid.  Psychosocial:  Unemployed  Hx. Alcoholism, chronic Will encourage group counseling attendance & participation.  PLAN: 1.12-01-16: No changes made on the current plan of care, continue current regimen as recommended.  Depression: Continue Citalopram 40mg  daily.  Anxiety: Hydroxyzine 25 mg prn Q 6 hours prn.  Mood control:  Risperdal 2 mg for daily at bedtime.  Nicotine Withdrawal symptoms: Continue the nicotine patch 21 mg Q 24 hours.  Insomnia. Continue Trazodone 200 mg Q bedtime. Continue to monitor mood, behavior and interaction with peers  Derrill Center, NP 12/02/2016, 11:50 AM

## 2016-12-02 NOTE — [facility] Group Notes (Signed)
[place] LCSW Group Therapy  12/02/2016 10 AM  Type of Therapy:  Group Therapy  Participation Level:  Minimal  Participation Quality:  Attentive  Affect:  Depressed and Flat  Cognitive:  Alert and Oriented  Insight:  None shared  Engagement in Therapy:  Lacking  Modes of Intervention:  Discussion, Education, Exploration, Socialization and Support  Summary of Progress/Problems: Topic for today was thoughts and feelings regarding discharge. We discussed fears of upcoming changes including judgements, expectations and stigma of mental health issues. We then discussed supports: what constitutes a supportive framework, identification of supports and what to do when others are not supportive. Patients had an opportunity to process the quote: "A joy not shared is cut in half, a pain not shared is doubled."  Patient appears attentive yet if asked a questions presents as unable to relate. Patient asked for remote as soon as group was closing.    Sheilah Pigeon, LCSW

## 2016-12-02 NOTE — B[facility] Group Notes (Signed)
[place] Group Notes: Healthy Coping Skills   Date:  12/02/2016  Time:  4:17 PM  Type of Therapy:  Psychoeducational Skills  Participation Level:  Active  Participation Quality:  Appropriate  Affect:  Anxious  Cognitive:  Alert  Insight:  Limited  Engagement in Group:  Engaged  Modes of Intervention:  Discussion and Education  Summary of Progress/Problems: Patient attended group and was engaged.   Gaylan Gerold E 12/02/2016, 4:17 PM

## 2016-12-02 NOTE — Progress Notes (Signed)
Patient did attend the evening speaker AA meeting.  

## 2016-12-03 NOTE — Progress Notes (Signed)
Patient ID: [name], female   DOB: 08-Oct-1960, 56 y.o.   MRN: [id number]  DAR: Pt. Denies SI/HI and A/V Hallucinations at time of assessment however reports on her daily inventory sheet that she is experiencing SI thoughts. She is able to contract for safety. She reports sleep is fair, appetite is fair, energy level is normal, and concentration is poor. She rates depression 5/10, hopelessness 4/10, and anxiety 11/10.  Support and encouragement provided to the patient. Scheduled medications and PRN Ibuprofen administered to patient per physician's orders. Patient is minimal with Probation officer and can be intrusive at times but overall is redirectable and cooperative. She is seen in the milieu and is attending group. Q15 minute checks are maintained for safety.

## 2016-12-03 NOTE — Progress Notes (Signed)
[facility] referral faxed 12/03/2016 10:57 AM. Several voicemails left for Angela/[facility] admission to check waitlist/bed availability with no call back. Last voicemail left for admissions 6/4 at 9:45AM.  Maxie Better, MSW, LCSW Clinical Social Worker 12/03/2016 10:59 AM

## 2016-12-03 NOTE — Progress Notes (Signed)
D.  Pt pleasant on approach, no complaints voiced other than continued chronic back pain which she states never gets below a 5/10.  Pt was positive for evening AA group, interacting appropriately with peers on the unit.  Pt denies SI/HI/hallucinations at this time and is hopeful to get accepted to [facility] this week.  A.  Support and encouragement offered, medication given as ordered  R. Pt remains safe on the unit, will continue to monitor.

## 2016-12-03 NOTE — [facility] Group Notes (Signed)
[place] LCSW Group Therapy  12/03/2016 3:54 PM  Type of Therapy:  Group Therapy  Participation Level:  Minimal  Participation Quality:  Attentive  Affect:  Appropriate  Cognitive:  Alert and Oriented  Insight:  Improving  Engagement in Therapy:  Improving  Modes of Intervention:  Confrontation, Discussion, Education, Problem-solving, Socialization and Support  Summary of Progress/Problems: Today's Topic: Overcoming Obstacles. Patients identified one short term goal and potential obstacles in reaching this goal. Patients processed barriers involved in overcoming these obstacles. Patients identified steps necessary for overcoming these obstacles and explored motivation (internal and external) for facing these difficulties head on.   [place] LCSW 12/03/2016, 3:54 PM

## 2016-12-03 NOTE — Plan of Care (Signed)
Problem: Coping: Goal: Ability to cope will improve Outcome: Progressing Pt has been attending groups on unit, denies suicidal ideation

## 2016-12-03 NOTE — Progress Notes (Signed)
Recreation Therapy Notes  Date: 12/03/16 Time: 0930 Location: 300 Hall Group Room  Group Topic: Stress Management  Goal Area(s) Addresses:  Patient will verbalize importance of using healthy stress management.  Patient will identify positive emotions associated with healthy stress management.   Intervention: Stress Management  Activity :  LRT introduced the stress management technique of meditation.  LRT played a meditation on gratitude to allow patients to engage in the meditation.  Patients were to follow along as the meditation played to fully participate in the activity.   Education:  Stress Management, Discharge Planning.   Education Outcome: Acknowledges edcuation/In group clarification offered/Needs additional education  Clinical Observations/Feedback: Pt did not attend group.   Victorino Sparrow, LRT/CTRS         Victorino Sparrow A 12/03/2016 11:48 AM

## 2016-12-03 NOTE — [facility] Group Notes (Signed)
PT SUMMARY   [name] was present throughout group.  She was awake at beginning of group, fell asleep during group.  She awoke several times at prompting of facilitator or other group members.  Upon waking, she apologized, but would fall asleep soon after.   She did not participate in group discussion   GROUP SUMMARY   Pt attended spiritual care group on grief and loss facilitated by chaplain Jerene Pitch   Group opened with brief discussion and psycho-social ed around grief and loss in relationships and in relation to self - identifying life patterns, circumstances, changes that cause losses. Established group norm of speaking from own life experience. Group goal of establishing open and affirming space for members to share loss and experience with grief, normalize grief experience and provide psycho social education and grief support.   [facility] / [facility] Chaplain Jerene Pitch, [place] Page [phone]  Office [phone]

## 2016-12-03 NOTE — Progress Notes (Signed)
[facility] MD Progress Note  12/03/2016 2:21 PM [name]  MRN:  [id number]   Subjective:  Patient reports " I have called the [facility] and they need someone form here to speak to them before I can discharge." patient reports that she really doesn't want to go to the [facility] and would prefer the [facility] program in [place].  Objective: [name] seen attending group session for grief and substance abuse. Patient reports passive thoughts of suicidal ideations. Denies attempts or plan.  Denies  homicidal ideation during this assessment. Denies auditory or visual hallucination and does not appear to be responding to internal stimuli. Reports taken Celexa without side effects. Reports her depression is "minimal"  Patient continues to appear future oriented with discharge plan. Reports she is resting well and states she has a good appetite. Support, encouragement and reassurance was provided.   Principal Problem: MDD (major depressive disorder), recurrent severe, without psychosis ([place]) Diagnosis:   Patient Active Problem List   Diagnosis Date Noted  . MDD (major depressive disorder), recurrent severe, without psychosis ([place]) [F33.2] 11/03/2016  . Alcohol-induced mood disorder with depressive symptoms ([place]) [F10.94] 10/26/2016  . Poor dentition [K08.9] 06/26/2016  . Essential hypertension [I10] 06/26/2016  . Hyperlipidemia [E78.5] 06/26/2016  . Elevated transaminase level [R74.0] 08/29/2015  . Health care maintenance [Z00.00] 12/31/2013  . Polysubstance abuse (h/o cocaine, ETOH, and tobacco abuse)  [F19.10]   . Primary vulvar squamous cell carcinoma s/p resection 2011 [C51.9] 02/13/2010  . 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 TOBACCO ABUSE [F17.200] 04/12/2006   Total Time spent with patient: 30 minutes  Past Psychiatric History:   Past Medical History:  Past Medical History:  Diagnosis Date  . Cocaine abuse   . ETOH abuse   . Hypothyroidism   . Unspecified mood  (affective) disorder [facility])     Past Surgical History:  Procedure Laterality Date  . radioactive iodine ablation     graves disease s/p   Family History: History reviewed. No pertinent family history. Family Psychiatric  History: Social History:  History  Alcohol Use No    Comment: Last drink: In the last month.      History  Drug Use No    Comment: Used Crack. Last used: Earlier in the month.     Social History   Social History  . Marital status: Single    Spouse name: N/A  . Number of children: N/A  . Years of education: N/A   Social History Main Topics  . Smoking status: Current Every Day Smoker    Packs/day: 0.30    Types: Cigarettes  . Smokeless tobacco: Never Used  . Alcohol use No     Comment: Last drink: In the last month.   . Drug use: No     Comment: Used Crack. Last used: Earlier in the month.   . Sexual activity: Not Asked   Other Topics Concern  . None   Social History Narrative  . None   Additional Social History:                         Sleep: Fair  Appetite:  Fair  Current Medications: Current Facility-Administered Medications  Medication Dose Route Frequency Provider Last Rate Last Dose  . alum & mag hydroxide-simeth (MAALOX/MYLANTA) 200-200-20 MG/5ML suspension 30 mL  30 mL Oral Q4H PRN Withrow, [name], FNP      . citalopram (CELEXA) tablet 40 mg  40 mg Oral  Daily Derrill Center, NP   40 mg at 12/03/16 0800  . hydrOXYzine (ATARAX/VISTARIL) tablet 25 mg  25 mg Oral Q6H PRN [name], FNP   25 mg at 12/02/16 2114  . ibuprofen (ADVIL,MOTRIN) tablet 600 mg  600 mg Oral Q8H PRN [name], FNP   600 mg at 12/03/16 0801  . levothyroxine (SYNTHROID, LEVOTHROID) tablet 75 mcg  75 mcg Oral Q0600 [name], FNP   75 mcg at 12/03/16 3790  . magnesium hydroxide (MILK OF MAGNESIA) suspension 30 mL  30 mL Oral Daily PRN Withrow, [name], FNP      . nicotine (NICODERM CQ - dosed in mg/24 hours) patch 21 mg  21 mg Transdermal  Daily [name], FNP   21 mg at 12/03/16 0801  . ondansetron (ZOFRAN) tablet 4 mg  4 mg Oral Q8H PRN Withrow, [name], FNP      . risperiDONE (RISPERDAL) tablet 2 mg  2 mg Oral QHS [name], FNP   2 mg at 12/02/16 2114  . traZODone (DESYREL) tablet 200 mg  200 mg Oral QHS Withrow, [name], FNP   200 mg at 12/02/16 2114    Lab Results: No results found for this or any previous visit (from the past 48 hour(s)).  Blood Alcohol level:  Lab Results  Component Value Date   ETH <5 11/26/2016   ETH 208 (H) 24/03/7352    Metabolic Disorder Labs: No results found for: HGBA1C, MPG No results found for: PROLACTIN Lab Results  Component Value Date   CHOL 191 09/12/2011   TRIG 64 09/12/2011   HDL 66 09/12/2011   CHOLHDL 2.9 09/12/2011   VLDL 13 09/12/2011   LDLCALC 112 (H) 09/12/2011   LDLCALC  12/26/2008    71        Total Cholesterol/HDL:CHD Risk Coronary Heart Disease Risk Table                     Men   Women  1/2 Average Risk   3.4   3.3  Average Risk       5.0   4.4  2 X Average Risk   9.6   7.1  3 X Average Risk  23.4   11.0        Use the calculated Patient Ratio above and the CHD Risk Table to determine the patient's CHD Risk.        ATP III CLASSIFICATION (LDL):  <100     mg/dL   Optimal  100-129  mg/dL   Near or Above                    Optimal  130-159  mg/dL   Borderline  160-189  mg/dL   High  >190     mg/dL   Very High    Physical Findings: AIMS: Facial and Oral Movements Muscles of Facial Expression: None, normal Lips and Perioral Area: None, normal Jaw: None, normal Tongue: None, normal,Extremity Movements Upper (arms, wrists, hands, fingers): None, normal Lower (legs, knees, ankles, toes): None, normal, Trunk Movements Neck, shoulders, hips: None, normal, Overall Severity Severity of abnormal movements (highest score from questions above): None, normal Incapacitation due to abnormal movements: None, normal Patient's awareness of abnormal  movements (rate only patient's report): No Awareness, Dental Status Current problems with teeth and/or dentures?: No Does patient usually wear dentures?: No  CIWA:  CIWA-Ar Total: 1 COWS:  COWS Total Score: 1  Musculoskeletal: Strength & Muscle Tone: within normal limits Gait & Station: normal Patient leans: N/A  Psychiatric Specialty Exam: Physical Exam  Vitals reviewed. Constitutional: She is oriented to person, place, and time. She appears well-developed.  Cardiovascular: Normal rate.   Neurological: She is alert and oriented to person, place, and time.  Skin: Skin is warm and dry.  Psychiatric: She has a normal mood and affect. Her behavior is normal.    Review of Systems  Psychiatric/Behavioral: Positive for depression, hallucinations and substance abuse. Negative for suicidal ideas. The patient is nervous/anxious.     Blood pressure 137/71, pulse 95, temperature 97.5 F (36.4 C), temperature source Oral, resp. rate 20, height 5\' 1"  (1.549 m), weight 72.1 kg (159 lb).Body mass index is 30.04 kg/m.  General Appearance: Casual paper scrubs   Eye Contact:  Good  Speech:  Clear and Coherent  Volume:  Normal  Mood:  Anxious and Depressed  Affect:  Congruent  Thought Process:  Coherent  Orientation:  Full (Time, Place, and Person)  Thought Content:  Hallucinations: None and Rumination  Suicidal Thoughts:  No  Homicidal Thoughts:  No  Memory:  Immediate;   Fair Recent;   Fair Remote;   Fair  Judgement:  Fair  Insight:  Fair  Psychomotor Activity:  Normal  Concentration:  Concentration: Fair and Attention Span: Fair  Recall:  [employer] of Knowledge:  Fair  Language:  Fair  Akathisia:  No  Handed:  Right  AIMS (if indicated):     Assets:  Communication Skills Desire for Improvement Resilience Social Support  ADL's:  Intact  Cognition:  WNL  Sleep:  Number of Hours: 6.25     I agree with current treatment plan on 12/03/2016, Patient seen face-to-face for  psychiatric evaluation follow-up, chart reviewed. Reviewed the information documented and agree with the treatment plan.  Treatment Plan Summary: Daily contact with patient to assess and evaluate symptoms and progress in treatment and Medication management   Continue with current treatment plan except where noted on 12/03/2016  Psychiatric: Alcoholism, chronic. Major depressive disorder.  Medical: Hypothyroidism: Continue Synthroid.  Psychosocial:  Unemployed  Hx. Alcoholism, chronic Will encourage group counseling attendance & participation.  PLAN: 1.12-01-16: No changes made on the current plan of care, continue current regimen as recommended.  Depression: Continue Citalopram 40mg  daily.  Anxiety: Hydroxyzine 25 mg prn Q 6 hours prn.  Mood control:  Risperdal 2 mg for daily at bedtime.  Nicotine Withdrawal symptoms: Continue the nicotine patch 21 mg Q 24 hours.  Insomnia. Continue Trazodone 200 mg Q bedtime. Continue to monitor mood, behavior and interaction with peers  Derrill Center, NP 12/03/2016, 2:21 PM  Agree with NP progress note

## 2016-12-04 NOTE — Progress Notes (Signed)
Nursing Note: 0700-1900  D:  Pt presents with anxious mood and animated affect.  She c/o difficulty concentrating, rates depression 5/10, hopelessness 8/10 and anxiety 8/10. Reported SI on self inventory, "I am waiting on a half-way house to go to when I leave, if I live there- I won't want to kill myself because it is not a shelter.  I am just so frustrated and sick, I have been thinking about how I could hurt myself."  Pt does not have a plan.  A:  Sat 1:1 with pt to discuss thoughts of SI, pt has no plan is and is able to contract for safety at this time.  Active listening and support provided.  Continued Q 15 minute safety checks.  Ibuprofen administered prn for back pain as ordered.  R:  Pt. Is cooperative and brightens in the milieu with peers. She is attending groups.  Denies A/V hallucinations and is able to verbally contract for safety at this time

## 2016-12-04 NOTE — Progress Notes (Signed)
[facility] MD Progress Note  12/04/2016 7:03 PM [name]  MRN:  [id number] Subjective:  56 yo AAF, single, homeless. Background history Depression and substance use. Self presented for voluntary admission. Recently got out of [facility]. Was having thoughts of relapsing and thoughts of suicide. UDS and BAL were negative at admission.  Chart reviewed today. Patient discussed at team  Staff reports that she has been pleasant. She has been cooperative with treatment. Hopeful discharge as soon as we have placement.  Seen today. Says she feels good. She wants to get into a halfway house. She is worried that if she does not get into sober living she would relapse and decompensate. No evidence of depression. No evidence of psychosis. No current suicidal or homicidal thoughts  Principal Problem: MDD (major depressive disorder), recurrent severe, without psychosis ([place]) Diagnosis:   Patient Active Problem List   Diagnosis Date Noted  . MDD (major depressive disorder), recurrent severe, without psychosis ([place]) [F33.2] 11/03/2016  . Alcohol-induced mood disorder with depressive symptoms ([place]) [F10.94] 10/26/2016  . Poor dentition [K08.9] 06/26/2016  . Essential hypertension [I10] 06/26/2016  . Hyperlipidemia [E78.5] 06/26/2016  . Elevated transaminase level [R74.0] 08/29/2015  . Health care maintenance [Z00.00] 12/31/2013  . Polysubstance abuse (h/o cocaine, ETOH, and tobacco abuse)  [F19.10]   . Primary vulvar squamous cell carcinoma s/p resection 2011 [C51.9] 02/13/2010  . 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 TOBACCO ABUSE [F17.200] 04/12/2006   Total Time spent with patient: 20 minutes  Past Psychiatric History: As in H&P  Past Medical History:  Past Medical History:  Diagnosis Date  . Cocaine abuse   . ETOH abuse   . Hypothyroidism   . Unspecified mood (affective) disorder [facility])     Past Surgical History:  Procedure Laterality Date  . radioactive iodine ablation     graves  disease s/p   Family History: History reviewed. No pertinent family history. Family Psychiatric  History: As in H&P Social History:  History  Alcohol Use No    Comment: Last drink: In the last month.      History  Drug Use No    Comment: Used Crack. Last used: Earlier in the month.     Social History   Social History  . Marital status: Single    Spouse name: N/A  . Number of children: N/A  . Years of education: N/A   Social History Main Topics  . Smoking status: Current Every Day Smoker    Packs/day: 0.30    Types: Cigarettes  . Smokeless tobacco: Never Used  . Alcohol use No     Comment: Last drink: In the last month.   . Drug use: No     Comment: Used Crack. Last used: Earlier in the month.   . Sexual activity: Not Asked   Other Topics Concern  . None   Social History Narrative  . None   Additional Social History:          Sleep: Good  Appetite:  Good  Current Medications: Current Facility-Administered Medications  Medication Dose Route Frequency Provider Last Rate Last Dose  . alum & mag hydroxide-simeth (MAALOX/MYLANTA) 200-200-20 MG/5ML suspension 30 mL  30 mL Oral Q4H PRN Withrow, [name], FNP      . citalopram (CELEXA) tablet 40 mg  40 mg Oral Daily Derrill Center, NP   40 mg at 12/04/16 4827  . hydrOXYzine (ATARAX/VISTARIL) tablet 25 mg  25 mg Oral Q6H PRN [name], FNP  25 mg at 12/02/16 2114  . ibuprofen (ADVIL,MOTRIN) tablet 600 mg  600 mg Oral Q8H PRN [name], FNP   600 mg at 12/04/16 0809  . levothyroxine (SYNTHROID, LEVOTHROID) tablet 75 mcg  75 mcg Oral Q0600 [name], FNP   75 mcg at 12/04/16 0559  . magnesium hydroxide (MILK OF MAGNESIA) suspension 30 mL  30 mL Oral Daily PRN Withrow, [name], FNP      . nicotine (NICODERM CQ - dosed in mg/24 hours) patch 21 mg  21 mg Transdermal Daily [name], FNP   21 mg at 12/04/16 0807  . ondansetron (ZOFRAN) tablet 4 mg  4 mg Oral Q8H PRN Withrow, [name], FNP      .  risperiDONE (RISPERDAL) tablet 2 mg  2 mg Oral QHS [name], FNP   2 mg at 12/03/16 2110  . traZODone (DESYREL) tablet 200 mg  200 mg Oral QHS Withrow, [name], FNP   200 mg at 12/03/16 2110    Lab Results: No results found for this or any previous visit (from the past 48 hour(s)).  Blood Alcohol level:  Lab Results  Component Value Date   ETH <5 11/26/2016   ETH 208 (H) 12/26/9483    Metabolic Disorder Labs: No results found for: HGBA1C, MPG No results found for: PROLACTIN Lab Results  Component Value Date   CHOL 191 09/12/2011   TRIG 64 09/12/2011   HDL 66 09/12/2011   CHOLHDL 2.9 09/12/2011   VLDL 13 09/12/2011   LDLCALC 112 (H) 09/12/2011   LDLCALC  12/26/2008    71        Total Cholesterol/HDL:CHD Risk Coronary Heart Disease Risk Table                     Men   Women  1/2 Average Risk   3.4   3.3  Average Risk       5.0   4.4  2 X Average Risk   9.6   7.1  3 X Average Risk  23.4   11.0        Use the calculated Patient Ratio above and the CHD Risk Table to determine the patient's CHD Risk.        ATP III CLASSIFICATION (LDL):  <100     mg/dL   Optimal  100-129  mg/dL   Near or Above                    Optimal  130-159  mg/dL   Borderline  160-189  mg/dL   High  >190     mg/dL   Very High    Physical Findings: AIMS: Facial and Oral Movements Muscles of Facial Expression: None, normal Lips and Perioral Area: None, normal Jaw: None, normal Tongue: None, normal,Extremity Movements Upper (arms, wrists, hands, fingers): None, normal Lower (legs, knees, ankles, toes): None, normal, Trunk Movements Neck, shoulders, hips: None, normal, Overall Severity Severity of abnormal movements (highest score from questions above): None, normal Incapacitation due to abnormal movements: None, normal Patient's awareness of abnormal movements (rate only patient's report): No Awareness, Dental Status Current problems with teeth and/or dentures?: No Does patient usually  wear dentures?: No  CIWA:  CIWA-Ar Total: 1 COWS:  COWS Total Score: 1  Musculoskeletal: Strength & Muscle Tone: within normal limits Gait & Station: normal Patient leans: N/A  Psychiatric Specialty Exam: Physical Exam  Constitutional: She appears well-developed and well-nourished.  HENT:  Head: Normocephalic and atraumatic.  Eyes: Pupils are equal, round, and reactive to light.  Neck: Normal range of motion. Neck supple.  Cardiovascular: Normal rate.   Respiratory: Effort normal and breath sounds normal.  GI: Soft. Bowel sounds are normal.  Musculoskeletal: Normal range of motion.  Neurological: She is alert.  Skin: Skin is warm and dry.  Psychiatric:  As above    ROS  Blood pressure (!) 106/56, pulse 99, temperature 97.7 F (36.5 C), temperature source Oral, resp. rate 16, height 5\' 1"  (1.549 m), weight 72.1 kg (159 lb).Body mass index is 30.04 kg/m.  General Appearance: In hospital clothing, pleasant, engaging well and cooperative. Appropriate behavior. Not in any distress. Good relatedness. Not internally stimulated  Eye Contact:  Good  Speech:  Clear and Coherent and Normal Rate  Volume:  Normal  Mood:  Euthymic  Affect:  Appropriate and Full Range  Thought Process:  Goal Directed and Linear  Orientation:  Full (Time, Place, and Person)  Thought Content:  No delusional theme. No preoccupation with violent thoughts. No negative ruminations. No obsession.  No hallucination in any modality.   Suicidal Thoughts:  No  Homicidal Thoughts:  No  Memory:  Immediate;   Good Recent;   Good Remote;   Good  Judgement:  Good  Insight:  Good  Psychomotor Activity:  Normal  Concentration:  Concentration: Good and Attention Span: Good  Recall:  Good  Fund of Knowledge:  Good  Language:  Good  Akathisia:  NA  Handed:    AIMS (if indicated):     Assets:  Communication Skills Desire for Improvement Resilience  ADL's:  Intact  Cognition:  WNL  Sleep:  Number of Hours: 6.5      Treatment Plan Summary: Patient is at baseline. We are finalizing appropriate placement. Hopeful discharge tomorrow. Would continue current regimen.   [place], MD 12/04/2016, 7:03 PM

## 2016-12-04 NOTE — Progress Notes (Signed)
Recreation Therapy Notes  Animal-Assisted Activity (AAA) Program Checklist/Progress Notes Patient Eligibility Criteria Checklist & Daily Group note for Rec TxIntervention  Date: 12/04/2016 Time: 1:55pm Location: 40 hall dayroom   AAA/T Program Assumption of Risk Form signed by Patient/ or Parent Legal Guardian Yes  Patient is free of allergies or sever asthma Yes  Patient reports no fear of animals Yes  Patient reports no history of cruelty to animals Yes  Patient understands his/her participation is voluntary Yes  Behavioral Response: Patient did not attend.  Donovan Kail, Recreation Therapy Intern

## 2016-12-04 NOTE — BHH Group Notes (Signed)
[place] LCSW Group Therapy  12/04/2016 3:21 PM  Type of Therapy:  Group Therapy  Participation Level:  Active  Participation Quality:  Attentive  Affect:  Appropriate  Cognitive:  Alert and Oriented  Insight:  Improving  Engagement in Therapy:  Improving  Modes of Intervention:  Confrontation, Discussion, Education, Problem-solving, Socialization and Support  Summary of Progress/Problems: [facility] Speaker came to talk about his personal journey with substance abuse and addiction. The pt processed ways by which to relate to the speaker. [place] speaker provided handouts and educational information pertaining to groups and services offered by the [facility].   Rohnan Bartleson N Smart LCSW 12/04/2016, 3:21 PM

## 2016-12-04 NOTE — Progress Notes (Signed)
D: Pt at the time of assessment was alert and oriented x4. Pt at the time endorsed moderate depression. Pt denied pain, anxiety, SI, HI of AVH. Pt remained flat and withdrawn to self even while in the dayroom. A: Medications offered as prescribed. All patient's questions and concerns addressed. Support, encouragement, and safe environment provided. Will continue to monitor for any changes. 15-minute safety checks continue. R: Pt was med compliant.  Pt attended wrap-up group. Safety checks continue.

## 2016-12-05 MED ORDER — CITALOPRAM HYDROBROMIDE 40 MG PO TABS: 40.0000 mg | ORAL_TABLET | Freq: Every day | ORAL | 0 refills | Status: DC

## 2016-12-05 MED ORDER — TRAZODONE HCL 100 MG PO TABS: 200.0000 mg | ORAL_TABLET | Freq: Every day | ORAL | 0 refills | Status: DC

## 2016-12-05 MED ORDER — LEVOTHYROXINE SODIUM 75 MCG PO TABS: 75.0000 ug | ORAL_TABLET | Freq: Every day | ORAL | 0 refills | Status: DC

## 2016-12-05 MED ORDER — LORAZEPAM 1 MG PO TABS
ORAL_TABLET | ORAL | Status: AC
  Filled 2016-12-05: qty 1

## 2016-12-05 MED ORDER — NICOTINE 21 MG/24HR TD PT24: 21.0000 mg | MEDICATED_PATCH | Freq: Every day | TRANSDERMAL | 0 refills | Status: DC

## 2016-12-05 MED ORDER — RISPERIDONE 2 MG PO TABS: 2.0000 mg | ORAL_TABLET | Freq: Every day | ORAL | 0 refills | Status: DC

## 2016-12-05 MED ORDER — HYDROXYZINE HCL 25 MG PO TABS: 25.0000 mg | ORAL_TABLET | Freq: Four times a day (QID) | ORAL | 0 refills | Status: DC | PRN

## 2016-12-05 NOTE — Progress Notes (Signed)
Recreation Therapy Notes  Date: 12/05/16 Time: 0930 Location: 300 Hall Dayroom  Group Topic: Stress Management  Goal Area(s) Addresses:  Patient will verbalize importance of using healthy stress management.  Patient will identify positive emotions associated with healthy stress management.   Behavioral Response: Engaged  Intervention: Stress Management  Activity :  Progressive Muscle Relaxation.  LRT introduced the stress management technique of progressive muscle relaxation.  LRT read a script to allow patients to participate in PMR which allowed them to tense and relax each muscle group individually.  Patients were to follow along with the script to fully engage in the activity.  Education:  Stress Management, Discharge Planning.   Education Outcome: Acknowledges edcuation/In group clarification offered/Needs additional education  Clinical Observations/Feedback: Pt attended group.   Victorino Sparrow, LRT/CTRS         Victorino Sparrow A 12/05/2016 11:38 AM

## 2016-12-05 NOTE — Tx Team (Addendum)
Interdisciplinary Treatment and Diagnostic Plan Update  12/05/2016 Time of Session: 0930 [name] MRN: [id number]  Principal Diagnosis: MDD RECURRENT SEVERE WITHOUT PSYCHOTIC FEATURES  Secondary Diagnoses: Principal Problem:   MDD (major depressive disorder), recurrent severe, without psychosis ([place])   Current Medications:  Current Facility-Administered Medications  Medication Dose Route Frequency Provider Last Rate Last Dose  . alum & mag hydroxide-simeth (MAALOX/MYLANTA) 200-200-20 MG/5ML suspension 30 mL  30 mL Oral Q4H PRN Withrow, [name], FNP      . citalopram (CELEXA) tablet 40 mg  40 mg Oral Daily Derrill Center, NP   40 mg at 12/05/16 0815  . hydrOXYzine (ATARAX/VISTARIL) tablet 25 mg  25 mg Oral Q6H PRN [name], FNP   25 mg at 12/02/16 2114  . ibuprofen (ADVIL,MOTRIN) tablet 600 mg  600 mg Oral Q8H PRN [name], FNP   600 mg at 12/05/16 0816  . levothyroxine (SYNTHROID, LEVOTHROID) tablet 75 mcg  75 mcg Oral Q0600 [name], FNP   75 mcg at 12/05/16 6834  . magnesium hydroxide (MILK OF MAGNESIA) suspension 30 mL  30 mL Oral Daily PRN Withrow, [name], FNP      . nicotine (NICODERM CQ - dosed in mg/24 hours) patch 21 mg  21 mg Transdermal Daily [name], FNP   21 mg at 12/05/16 0815  . ondansetron (ZOFRAN) tablet 4 mg  4 mg Oral Q8H PRN Withrow, [name], FNP      . risperiDONE (RISPERDAL) tablet 2 mg  2 mg Oral QHS [name], FNP   2 mg at 12/04/16 2104  . traZODone (DESYREL) tablet 200 mg  200 mg Oral QHS Withrow, [name], FNP   200 mg at 12/04/16 2105   PTA Medications: Prescriptions Prior to Admission  Medication Sig Dispense Refill Last Dose  . citalopram (CELEXA) 20 MG tablet Take 1 tablet (20 mg total) by mouth daily. For depression 30 tablet 0 Past Week at Unknown time  . hydrOXYzine (ATARAX/VISTARIL) 25 MG tablet Take 1 tablet (25 mg total) by mouth every 6 (six) hours as needed for anxiety. 60 tablet 0 Past Week at Unknown time  .  levothyroxine (SYNTHROID, LEVOTHROID) 75 MCG tablet Take 1 tablet (75 mcg total) by mouth daily at 6 (six) AM. For low functioning thyroid 30 tablet 0 11/26/2016 at Unknown time  . nicotine (NICODERM CQ - DOSED IN MG/24 HOURS) 21 mg/24hr patch Place 1 patch (21 mg total) onto the skin daily. For smoking cessation 28 patch 0 11/25/2016 at Unknown time  . risperiDONE (RISPERDAL) 2 MG tablet Take 1 tablet (2 mg total) by mouth at bedtime. For mood control 30 tablet 0 11/25/2016 at Unknown time  . traZODone (DESYREL) 100 MG tablet Take 2 tablets (200 mg total) by mouth at bedtime. For sleep 60 tablet 0 11/25/2016 at Unknown time    Patient Stressors: Financial difficulties Loss of housing  Patient Strengths: Average or above average intelligence Communication skills Motivation for treatment/growth Supportive family/friends  Treatment Modalities: Medication Management, Group therapy, Case management,  1 to 1 session with clinician, Psychoeducation, Recreational therapy.   Physician Treatment Plan for Primary Diagnosis: MDD RECURRENT SEVERE WITHOUT PSYCHOTIC FEATURES Long Term Goal(s): Improvement in symptoms so as ready for discharge Improvement in symptoms so as ready for discharge   Short Term Goals: Ability to identify changes in lifestyle to reduce recurrence of condition will improve Ability to verbalize feelings will improve Ability to disclose and discuss suicidal ideas  Ability to demonstrate self-control will improve Ability to identify and develop effective coping behaviors will improve Ability to maintain clinical measurements within normal limits will improve Ability to identify triggers associated with substance abuse/mental health issues will improve  Medication Management: Evaluate patient's response, side effects, and tolerance of medication regimen.  Therapeutic Interventions: 1 to 1 sessions, Unit Group sessions and Medication administration.  Evaluation of Outcomes:  Progressing  Physician Treatment Plan for Secondary Diagnosis: Principal Problem:   MDD (major depressive disorder), recurrent severe, without psychosis ([place])  Long Term Goal(s): Improvement in symptoms so as ready for discharge Improvement in symptoms so as ready for discharge   Short Term Goals: Ability to identify changes in lifestyle to reduce recurrence of condition will improve Ability to verbalize feelings will improve Ability to disclose and discuss suicidal ideas Ability to demonstrate self-control will improve Ability to identify and develop effective coping behaviors will improve Ability to maintain clinical measurements within normal limits will improve Ability to identify triggers associated with substance abuse/mental health issues will improve     Medication Management: Evaluate patient's response, side effects, and tolerance of medication regimen.  Therapeutic Interventions: 1 to 1 sessions, Unit Group sessions and Medication administration.  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 RN will report any adverse and/or side effects to prescribing provider.  Therapeutic Interventions: 1 on 1 counseling sessions, Psychoeducation, Medication administration, Evaluate responses to treatment, Monitor vital signs and CBGs as ordered, Perform/monitor CIWA, COWS, AIMS and Fall Risk screenings as ordered, Perform wound care treatments as ordered.  Evaluation of  Outcomes: Progressing   LCSW Treatment Plan for Primary Diagnosis: MDD RECURRENT SEVERE WITHOUT PSYCHOTIC FEATURES Long Term Goal(s): Safe transition to appropriate next level of care at discharge, Engage patient in therapeutic group addressing interpersonal concerns.  Short Term Goals: Engage patient in aftercare planning with referrals and resources, Facilitate patient progression through stages of change regarding substance use diagnoses and concerns and Identify triggers associated with mental health/substance abuse issues  Therapeutic Interventions: Assess for all discharge needs, 1 to 1 time with Social worker, Explore available resources and support systems, Assess for adequacy in community support network, Educate family and significant other(s) on suicide prevention, Complete Psychosocial Assessment, Interpersonal group therapy.  Evaluation of Outcomes: Progressing   Progress in Treatment: Attending groups: Yes. Participating in groups: Yes. Taking medication as prescribed: Yes. Toleration medication: Yes. Family/Significant other contact made: SPe completed with pt; pt declined to consent to family contact.  Patient understands diagnosis: Yes. Discussing patient identified problems/goals with staff: Yes. Medical problems stabilized or resolved: Yes. Denies suicidal/homicidal ideation: Yes. Issues/concerns per patient self-inventory: No. Other: n/a   New problem(s) identified: No, Describe:  n/a   New Short Term/Long Term Goal(s): none identified   Discharge Plan or Barriers: pt has been given [facility]; [facility] application,  [facility] pamphlet, [facility] [place] pamphlet. She was encouraged to call [facility]. Pt also referred to [facility] for [facility]. Pt and CSW spoke with Cloyde Reams [phone] at local [place] who states she will call  CSW on Friday to schedule an interview for Friday evening. Pt can discharge directly to this meeting.    Reason for Continuation of Hospitalization: Depression; Medication management   Estimated Length of Stay: 2 days (tenative discharge on Friday)  Attendees: Patient: 12/05/2016 10:48 AM  Physician: Dr. Sanjuana Letters MD 12/05/2016 10:48 AM  Nursing: Carlynn Purl RN 12/05/2016 10:48 AM  RN Care Manager: Lars Pinks CM 12/05/2016 10:48 AM  Social Worker: Maxie Better, LCSW 12/05/2016 10:48 AM  Recreational Therapist: x 12/05/2016 10:48 AM  Other: Lindell Spar NP 12/05/2016 10:48 AM  Other:  12/05/2016 10:48 AM  Other: 12/05/2016 10:48 AM    Scribe for Treatment Team: [place], LCSW 12/05/2016 10:48 AM

## 2016-12-05 NOTE — Progress Notes (Signed)
DAR NOTE: Pt present with flat affect and depressed mood in the unit. Pt has been visible in the dayroom interacting with peers. Pt denies physical pain, took all her meds as scheduled. As per self inventory, pt had a fair night sleep, fair appetite, normal energy, and poor concentration. Pt rate depression at 7, hopeless ness at 7, and anxiety at 6. Pt's safety ensured with 15 minute and environmental checks. Pt currently denies SI/HI and A/V hallucinations. Pt verbally agrees to seek staff if SI/HI or A/VH occurs and to consult with staff before acting on these thoughts. Will continue POC.

## 2016-12-05 NOTE — Progress Notes (Signed)
[facility] MD Progress Note  12/05/2016 11:30 AM [name]  MRN:  [id number] Subjective:  56 yo AAF, single, homeless. Background history Depression and substance use. Self presented for voluntary admission. Recently got out of [facility]. Was having thoughts of relapsing and thoughts of suicide. UDS and BAL were negative at admission.  Chart reviewed today. Patient discussed at team  Staff reports that she has been appropriate. She has been pleasant and cooperative. She has not voiced any suicidal thoughts. She did not express any thoughts of violence.  SW reports that [place] housing would not be available until Friday.   Seen today. Reports being in good spirits. A bit disappointed she has to wait for two more days. Says it is worth the wait as she would relapse if not in a safe place. Not feeling depressed. No suicidal thoughts. Slept well last night. No thoughts of violence.   Principal Problem: MDD (major depressive disorder), recurrent severe, without psychosis ([place]) Diagnosis:   Patient Active Problem List   Diagnosis Date Noted  . MDD (major depressive disorder), recurrent severe, without psychosis ([place]) [F33.2] 11/03/2016  . Alcohol-induced mood disorder with depressive symptoms ([place]) [F10.94] 10/26/2016  . Poor dentition [K08.9] 06/26/2016  . Essential hypertension [I10] 06/26/2016  . Hyperlipidemia [E78.5] 06/26/2016  . Elevated transaminase level [R74.0] 08/29/2015  . Health care maintenance [Z00.00] 12/31/2013  . Polysubstance abuse (h/o cocaine, ETOH, and tobacco abuse)  [F19.10]   . Primary vulvar squamous cell carcinoma s/p resection 2011 [C51.9] 02/13/2010  . 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 TOBACCO ABUSE [F17.200] 04/12/2006   Total Time spent with patient: 20 minutes  Past Psychiatric History: As in H&P  Past Medical History:  Past Medical History:  Diagnosis Date  . Cocaine abuse   . ETOH abuse   . Hypothyroidism   . Unspecified mood (affective)  disorder [facility])     Past Surgical History:  Procedure Laterality Date  . radioactive iodine ablation     graves disease s/p   Family History: History reviewed. No pertinent family history. Family Psychiatric  History: As in H&P Social History:  History  Alcohol Use No    Comment: Last drink: In the last month.      History  Drug Use No    Comment: Used Crack. Last used: Earlier in the month.     Social History   Social History  . Marital status: Single    Spouse name: N/A  . Number of children: N/A  . Years of education: N/A   Social History Main Topics  . Smoking status: Current Every Day Smoker    Packs/day: 0.30    Types: Cigarettes  . Smokeless tobacco: Never Used  . Alcohol use No     Comment: Last drink: In the last month.   . Drug use: No     Comment: Used Crack. Last used: Earlier in the month.   . Sexual activity: Not Asked   Other Topics Concern  . None   Social History Narrative  . None   Additional Social History:          Sleep: Good  Appetite:  Good  Current Medications: Current Facility-Administered Medications  Medication Dose Route Frequency Provider Last Rate Last Dose  . alum & mag hydroxide-simeth (MAALOX/MYLANTA) 200-200-20 MG/5ML suspension 30 mL  30 mL Oral Q4H PRN Withrow, [name], FNP      . citalopram (CELEXA) tablet 40 mg  40 mg Oral Daily Derrill Center, NP  40 mg at 12/05/16 0815  . hydrOXYzine (ATARAX/VISTARIL) tablet 25 mg  25 mg Oral Q6H PRN [name], FNP   25 mg at 12/02/16 2114  . ibuprofen (ADVIL,MOTRIN) tablet 600 mg  600 mg Oral Q8H PRN [name], FNP   600 mg at 12/05/16 0816  . levothyroxine (SYNTHROID, LEVOTHROID) tablet 75 mcg  75 mcg Oral Q0600 [name], FNP   75 mcg at 12/05/16 5329  . magnesium hydroxide (MILK OF MAGNESIA) suspension 30 mL  30 mL Oral Daily PRN Withrow, [name], FNP      . nicotine (NICODERM CQ - dosed in mg/24 hours) patch 21 mg  21 mg Transdermal Daily [name], FNP    21 mg at 12/05/16 0815  . ondansetron (ZOFRAN) tablet 4 mg  4 mg Oral Q8H PRN Withrow, [name], FNP      . risperiDONE (RISPERDAL) tablet 2 mg  2 mg Oral QHS [name], FNP   2 mg at 12/04/16 2104  . traZODone (DESYREL) tablet 200 mg  200 mg Oral QHS Withrow, [name], FNP   200 mg at 12/04/16 2105    Lab Results: No results found for this or any previous visit (from the past 48 hour(s)).  Blood Alcohol level:  Lab Results  Component Value Date   ETH <5 11/26/2016   ETH 208 (H) 92/42/6834    Metabolic Disorder Labs: No results found for: HGBA1C, MPG No results found for: PROLACTIN Lab Results  Component Value Date   CHOL 191 09/12/2011   TRIG 64 09/12/2011   HDL 66 09/12/2011   CHOLHDL 2.9 09/12/2011   VLDL 13 09/12/2011   LDLCALC 112 (H) 09/12/2011   LDLCALC  12/26/2008    71        Total Cholesterol/HDL:CHD Risk Coronary Heart Disease Risk Table                     Men   Women  1/2 Average Risk   3.4   3.3  Average Risk       5.0   4.4  2 X Average Risk   9.6   7.1  3 X Average Risk  23.4   11.0        Use the calculated Patient Ratio above and the CHD Risk Table to determine the patient's CHD Risk.        ATP III CLASSIFICATION (LDL):  <100     mg/dL   Optimal  100-129  mg/dL   Near or Above                    Optimal  130-159  mg/dL   Borderline  160-189  mg/dL   High  >190     mg/dL   Very High    Physical Findings: AIMS: Facial and Oral Movements Muscles of Facial Expression: None, normal Lips and Perioral Area: None, normal Jaw: None, normal Tongue: None, normal,Extremity Movements Upper (arms, wrists, hands, fingers): None, normal Lower (legs, knees, ankles, toes): None, normal, Trunk Movements Neck, shoulders, hips: None, normal, Overall Severity Severity of abnormal movements (highest score from questions above): None, normal Incapacitation due to abnormal movements: None, normal Patient's awareness of abnormal movements (rate only patient's  report): No Awareness, Dental Status Current problems with teeth and/or dentures?: No Does patient usually wear dentures?: No  CIWA:  CIWA-Ar Total: 1 COWS:  COWS Total Score: 1  Musculoskeletal: Strength & Muscle Tone: within  normal limits Gait & Station: normal Patient leans: N/A  Psychiatric Specialty Exam: Physical Exam  Constitutional: She appears well-developed and well-nourished.  HENT:  Head: Normocephalic and atraumatic.  Eyes: Pupils are equal, round, and reactive to light.  Neck: Normal range of motion. Neck supple.  Cardiovascular: Normal rate.   Respiratory: Effort normal and breath sounds normal.  GI: Soft. Bowel sounds are normal.  Musculoskeletal: Normal range of motion.  Neurological: She is alert.  Skin: Skin is warm and dry.  Psychiatric:  As above    ROS  Blood pressure 120/65, pulse 99, temperature 97.5 F (36.4 C), temperature source Oral, resp. rate 16, height 5\' 1"  (1.549 m), weight 72.1 kg (159 lb).Body mass index is 30.04 kg/m.  General Appearance: Calm and cooperative. Appropriate behavior.   Eye Contact:  Good  Speech:  Clear and Coherent and Normal Rate  Volume:  Normal  Mood:  Euthymic  Affect:  Appropriate and Full Range  Thought Process:  Goal Directed and Linear  Orientation:  Full (Time, Place, and Person)  Thought Content:  No delusional theme. No preoccupation with violent thoughts. No negative ruminations. No obsession.  No hallucination in any modality.   Suicidal Thoughts:  No  Homicidal Thoughts:  No  Memory:  Immediate;   Good Recent;   Good Remote;   Good  Judgement:  Good  Insight:  Good  Psychomotor Activity:  Normal  Concentration:  Concentration: Good and Attention Span: Good  Recall:  Good  Fund of Knowledge:  Good  Language:  Good  Akathisia:  NA  Handed:    AIMS (if indicated):     Assets:  Communication Skills Desire for Improvement Resilience  ADL's:  Intact  Cognition:  WNL  Sleep:  Number of Hours: 6.75      Treatment Plan Summary: Patient is at baseline. We are finalizing appropriate placement. Housing would be available on Friday.   Psychiatric: MDD recurrent Alcohol Use Disorder  Medical: HTN HLD Hypothyroidism  Psychosocial:  Homelessness Unemployed Limited support  PLAN: 1. Continue current regimen 2. Continue to monitor mood, behavior and interaction with peers 3. Hopeful discharge on Friday.     Artist Beach, MD 12/05/2016, 11:30 AMPatient ID: [name], female   DOB: 1960/07/22, 56 y.o.   MRN: [id number]

## 2016-12-05 NOTE — Progress Notes (Signed)
D: Pt at the time of assessment was alert and oriented x4. Pt at the time endorsed moderate depression and anxiety; states, "I hope I will be accepted in the [place]; waiting for them to give a reply is making me a little anxious." Pt denied pain, SI, HI of AVH. Pt remained flat and withdrawn to self. A: Medications offered as prescribed. All patient's questions and concerns addressed. Support, encouragement, and safe environment provided. Will continue to monitor for any changes. 15-minute safety checks continue. R: Pt was med compliant.  Pt attended wrap-up group. Safety checks continue.

## 2016-12-05 NOTE — Progress Notes (Signed)
Adult Psychoeducational Group Note  Date:  12/05/2016 Time:  5:56 AM  Group Topic/Focus:  Wrap-Up Group:   The focus of this group is to help patients review their daily goal of treatment and discuss progress on daily workbooks.  Participation Level:  Active  Participation Quality:  Appropriate, Attentive, Sharing and Supportive  Affect:  Appropriate  Cognitive:  Appropriate  Insight: Appropriate and Good  Engagement in Group:  Developing/Improving  Modes of Intervention:  Discussion, Socialization and Support  Additional Comments:  Pt shared during group she will be going to halfway house and looking forward to going.  Abe People Brittini 12/05/2016, 5:56 AM

## 2016-12-06 DIAGNOSIS — Z590 Homelessness: Secondary | ICD-10-CM

## 2016-12-06 DIAGNOSIS — I10 Essential (primary) hypertension: Secondary | ICD-10-CM

## 2016-12-06 DIAGNOSIS — E785 Hyperlipidemia, unspecified: Secondary | ICD-10-CM

## 2016-12-06 NOTE — BHH Group Notes (Signed)
[place] LCSW Group Therapy  12/06/2016 3:59 PM  Type of Therapy:  Group Therapy  Participation Level:  Active  Participation Quality:  Attentive  Affect:  Appropriate  Cognitive:  Alert and Oriented  Insight:  Improving  Engagement in Therapy:  Engaged  Modes of Intervention:  Confrontation, Discussion, Education, Socialization and Support  Summary of Progress/Problems: Today's Topic: Overcoming Obstacles. Patients identified one short term goal and potential obstacles in reaching this goal. Patients processed barriers involved in overcoming these obstacles. Patients identified steps necessary for overcoming these obstacles and explored motivation (internal and external) for facing these difficulties head on. [name] was attentive and engaged during today's processing group. She shared that her obstacle is getting into an [place] house by discharge. She has been calling multiple houses in an attempt to gain admission. [name] states that she is motivated to stay sober and avoid relapse. She continues to show progress in the group setting with improving insight.   [name] N Smart LCSW 12/06/2016, 3:59 PM

## 2016-12-06 NOTE — Progress Notes (Signed)
[facility] MD Progress Note  12/06/2016 2:19 PM [name]  MRN:  [id number] Subjective:  56 yo AAF, single, homeless. Background history Depression and substance use. Self presented for voluntary admission. Recently got out of [facility]. Was having thoughts of relapsing and thoughts of suicide. UDS and BAL were negative at admission.  Chart reviewed today. Patient discussed at team  Staff reports that patient has been appropriate on the unit. Patient has been interacting well with peers. No behavioral issues. Patient has not voiced any suicidal thoughts. Patient has not been observed to be internally stimulated. Patient has been adherent with treatment recommendations. Patient has been tolerating their medication well.   Seen today. In good spirits. No evidence of depression. No evidence of mania. No evidence of psychosis. Hopes to get into sober living tomorrow. No thougts of violence. No suicidal or homicidal thoughts.  Principal Problem: MDD (major depressive disorder), recurrent severe, without psychosis ([place]) Diagnosis:   Patient Active Problem List   Diagnosis Date Noted  . MDD (major depressive disorder), recurrent severe, without psychosis ([place]) [F33.2] 11/03/2016  . Alcohol-induced mood disorder with depressive symptoms ([place]) [F10.94] 10/26/2016  . Poor dentition [K08.9] 06/26/2016  . Essential hypertension [I10] 06/26/2016  . Hyperlipidemia [E78.5] 06/26/2016  . Elevated transaminase level [R74.0] 08/29/2015  . Health care maintenance [Z00.00] 12/31/2013  . Polysubstance abuse (h/o cocaine, ETOH, and tobacco abuse)  [F19.10]   . Primary vulvar squamous cell carcinoma s/p resection 2011 [C51.9] 02/13/2010  . 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 TOBACCO ABUSE [F17.200] 04/12/2006   Total Time spent with patient: 20 minutes  Past Psychiatric History: As in H&P  Past Medical History:  Past Medical History:  Diagnosis Date  . Cocaine abuse   . ETOH abuse   .  Hypothyroidism   . Unspecified mood (affective) disorder [facility])     Past Surgical History:  Procedure Laterality Date  . radioactive iodine ablation     graves disease s/p   Family History: History reviewed. No pertinent family history. Family Psychiatric  History: As in H&P Social History:  History  Alcohol Use No    Comment: Last drink: In the last month.      History  Drug Use No    Comment: Used Crack. Last used: Earlier in the month.     Social History   Social History  . Marital status: Single    Spouse name: N/A  . Number of children: N/A  . Years of education: N/A   Social History Main Topics  . Smoking status: Current Every Day Smoker    Packs/day: 0.30    Types: Cigarettes  . Smokeless tobacco: Never Used  . Alcohol use No     Comment: Last drink: In the last month.   . Drug use: No     Comment: Used Crack. Last used: Earlier in the month.   . Sexual activity: Not Asked   Other Topics Concern  . None   Social History Narrative  . None   Additional Social History:          Sleep: Good  Appetite:  Good  Current Medications: Current Facility-Administered Medications  Medication Dose Route Frequency Provider Last Rate Last Dose  . alum & mag hydroxide-simeth (MAALOX/MYLANTA) 200-200-20 MG/5ML suspension 30 mL  30 mL Oral Q4H PRN Withrow, [name], FNP      . citalopram (CELEXA) tablet 40 mg  40 mg Oral Daily Derrill Center, NP   40 mg at 12/06/16 0755  .  hydrOXYzine (ATARAX/VISTARIL) tablet 25 mg  25 mg Oral Q6H PRN [name], FNP   25 mg at 12/02/16 2114  . ibuprofen (ADVIL,MOTRIN) tablet 600 mg  600 mg Oral Q8H PRN [name], FNP   600 mg at 12/06/16 0757  . levothyroxine (SYNTHROID, LEVOTHROID) tablet 75 mcg  75 mcg Oral Q0600 [name], FNP   75 mcg at 12/06/16 0109  . magnesium hydroxide (MILK OF MAGNESIA) suspension 30 mL  30 mL Oral Daily PRN Withrow, [name], FNP      . nicotine (NICODERM CQ - dosed in mg/24 hours) patch 21 mg   21 mg Transdermal Daily [name], FNP   21 mg at 12/06/16 0756  . ondansetron (ZOFRAN) tablet 4 mg  4 mg Oral Q8H PRN Withrow, [name], FNP      . risperiDONE (RISPERDAL) tablet 2 mg  2 mg Oral QHS [name], FNP   2 mg at 12/05/16 2108  . traZODone (DESYREL) tablet 200 mg  200 mg Oral QHS Withrow, [name], FNP   200 mg at 12/05/16 2108    Lab Results: No results found for this or any previous visit (from the past 48 hour(s)).  Blood Alcohol level:  Lab Results  Component Value Date   ETH <5 11/26/2016   ETH 208 (H) 32/35/5732    Metabolic Disorder Labs: No results found for: HGBA1C, MPG No results found for: PROLACTIN Lab Results  Component Value Date   CHOL 191 09/12/2011   TRIG 64 09/12/2011   HDL 66 09/12/2011   CHOLHDL 2.9 09/12/2011   VLDL 13 09/12/2011   LDLCALC 112 (H) 09/12/2011   LDLCALC  12/26/2008    71        Total Cholesterol/HDL:CHD Risk Coronary Heart Disease Risk Table                     Men   Women  1/2 Average Risk   3.4   3.3  Average Risk       5.0   4.4  2 X Average Risk   9.6   7.1  3 X Average Risk  23.4   11.0        Use the calculated Patient Ratio above and the CHD Risk Table to determine the patient's CHD Risk.        ATP III CLASSIFICATION (LDL):  <100     mg/dL   Optimal  100-129  mg/dL   Near or Above                    Optimal  130-159  mg/dL   Borderline  160-189  mg/dL   High  >190     mg/dL   Very High    Physical Findings: AIMS: Facial and Oral Movements Muscles of Facial Expression: None, normal Lips and Perioral Area: None, normal Jaw: None, normal Tongue: None, normal,Extremity Movements Upper (arms, wrists, hands, fingers): None, normal Lower (legs, knees, ankles, toes): None, normal, Trunk Movements Neck, shoulders, hips: None, normal, Overall Severity Severity of abnormal movements (highest score from questions above): None, normal Incapacitation due to abnormal movements: None, normal Patient's  awareness of abnormal movements (rate only patient's report): No Awareness, Dental Status Current problems with teeth and/or dentures?: No Does patient usually wear dentures?: No  CIWA:  CIWA-Ar Total: 1 COWS:  COWS Total Score: 1  Musculoskeletal: Strength & Muscle Tone: within normal limits Gait & Station: normal Patient  leans: N/A  Psychiatric Specialty Exam: Physical Exam  Constitutional: She appears well-developed and well-nourished.  HENT:  Head: Normocephalic and atraumatic.  Eyes: Pupils are equal, round, and reactive to light.  Neck: Normal range of motion. Neck supple.  Cardiovascular: Normal rate.   Respiratory: Effort normal and breath sounds normal.  GI: Soft. Bowel sounds are normal.  Musculoskeletal: Normal range of motion.  Neurological: She is alert.  Skin: Skin is warm and dry.  Psychiatric:  As above    ROS  Blood pressure 116/66, pulse 96, temperature 98.5 F (36.9 C), temperature source Oral, resp. rate 16, height 5\' 1"  (1.549 m), weight 72.1 kg (159 lb).Body mass index is 30.04 kg/m.  General Appearance: In hospital clothing, pleasant, calm and cooperative. Appropriate behavior.   Eye Contact:  Good  Speech:  Clear and Coherent and Normal Rate  Volume:  Normal  Mood:  Euthymic  Affect:  Appropriate and Full Range  Thought Process:  Goal Directed and Linear  Orientation:  Full (Time, Place, and Person)  Thought Content:  No delusional theme. No preoccupation with violent thoughts. No negative ruminations. No obsession.  No hallucination in any modality.   Suicidal Thoughts:  No  Homicidal Thoughts:  No  Memory:  Immediate;   Good Recent;   Good Remote;   Good  Judgement:  Good  Insight:  Good  Psychomotor Activity:  Normal  Concentration:  Concentration: Good and Attention Span: Good  Recall:  Good  Fund of Knowledge:  Good  Language:  Good  Akathisia:  NA  Handed:    AIMS (if indicated):     Assets:  Communication Skills Desire for  Improvement Resilience  ADL's:  Intact  Cognition:  WNL  Sleep:  Number of Hours: 6.75     Treatment Plan Summary: Patient is at baseline. No dangerousness. No evidence of depression, psychosis or mania. For discharge tomorrow.    Psychiatric: MDD recurrent Alcohol Use Disorder  Medical: HTN HLD Hypothyroidism  Psychosocial:  Homelessness Unemployed Limited support  PLAN: 1. Continue current regimen 2. Continue to monitor mood, behavior and interaction with peers 3. Discharge tomorrow.     Artist Beach, MD 12/06/2016, 2:19 PMPatient ID: [name], female   DOB: 07-22-1960, 56 y.o.   MRN: [id number] Patient ID: [name], female   DOB: September 09, 1960, 56 y.o.   MRN: [id number]

## 2016-12-06 NOTE — Progress Notes (Signed)
Pt has been in the dayroom most of the evening.  She attended the evening NA group tonight.  She came to the nurse's station after receiving her snack stating that she wanted to get her hs meds as the other patients were going to watch a game, and she was not interested in watching it.  She decided that she would just go to bed early.  She presented as flat and sad, but brightened in conversation.  She voices no needs or concerns.  She denies SI/HI/AVH.  She denies having any withdrawal symptoms at this time.  She plans to go to a halfway house after discharge.  Support and encouragement offered.  Discharge plans are in process.  Safety maintained with q15 minute checks.

## 2016-12-07 NOTE — Progress Notes (Signed)
Pt has been pleasant and cooperative this evening.  She denies SI/HI/AVH.  She has been observed sitting in the dayroom talking with peers and watching TV.  She is looking forward to being discharged tomorrow to a halfway house.  She attended karaoke this evening which she enjoyed.  She makes her needs known to staff.  Support and encouragement offered.  Discharge plans are in process.Safety maintained with q15 minute checks.

## 2016-12-07 NOTE — Progress Notes (Signed)
Patient attended AA group meeting.  

## 2016-12-07 NOTE — Tx Team (Signed)
Interdisciplinary Treatment and Diagnostic Plan Update  12/07/2016 Time of Session: 0930 [name] MRN: [id number]  Principal Diagnosis: MDD RECURRENT SEVERE WITHOUT PSYCHOTIC FEATURES  Secondary Diagnoses: Principal Problem:   MDD (major depressive disorder), recurrent severe, without psychosis ([place])   Current Medications:  Current Facility-Administered Medications  Medication Dose Route Frequency Provider Last Rate Last Dose  . alum & mag hydroxide-simeth (MAALOX/MYLANTA) 200-200-20 MG/5ML suspension 30 mL  30 mL Oral Q4H PRN Withrow, [name], FNP      . citalopram (CELEXA) tablet 40 mg  40 mg Oral Daily Derrill Center, NP   40 mg at 12/07/16 2637  . hydrOXYzine (ATARAX/VISTARIL) tablet 25 mg  25 mg Oral Q6H PRN [name], FNP   25 mg at 12/02/16 2114  . ibuprofen (ADVIL,MOTRIN) tablet 600 mg  600 mg Oral Q8H PRN [name], FNP   600 mg at 12/07/16 8588  . levothyroxine (SYNTHROID, LEVOTHROID) tablet 75 mcg  75 mcg Oral Q0600 [name], FNP   75 mcg at 12/07/16 5027  . magnesium hydroxide (MILK OF MAGNESIA) suspension 30 mL  30 mL Oral Daily PRN Withrow, [name], FNP      . nicotine (NICODERM CQ - dosed in mg/24 hours) patch 21 mg  21 mg Transdermal Daily [name], FNP   21 mg at 12/07/16 7412  . ondansetron (ZOFRAN) tablet 4 mg  4 mg Oral Q8H PRN Withrow, [name], FNP      . risperiDONE (RISPERDAL) tablet 2 mg  2 mg Oral QHS [name], FNP   2 mg at 12/06/16 2153  . traZODone (DESYREL) tablet 200 mg  200 mg Oral QHS Withrow, [name], FNP   200 mg at 12/06/16 2153   PTA Medications: Prescriptions Prior to Admission  Medication Sig Dispense Refill Last Dose  . [DISCONTINUED] citalopram (CELEXA) 20 MG tablet Take 1 tablet (20 mg total) by mouth daily. For depression 30 tablet 0 Past Week at Unknown time  . [DISCONTINUED] hydrOXYzine (ATARAX/VISTARIL) 25 MG tablet Take 1 tablet (25 mg total) by mouth every 6 (six) hours as needed for anxiety. 60 tablet 0  Past Week at Unknown time  . [DISCONTINUED] levothyroxine (SYNTHROID, LEVOTHROID) 75 MCG tablet Take 1 tablet (75 mcg total) by mouth daily at 6 (six) AM. For low functioning thyroid 30 tablet 0 11/26/2016 at Unknown time  . [DISCONTINUED] nicotine (NICODERM CQ - DOSED IN MG/24 HOURS) 21 mg/24hr patch Place 1 patch (21 mg total) onto the skin daily. For smoking cessation 28 patch 0 11/25/2016 at Unknown time  . [DISCONTINUED] risperiDONE (RISPERDAL) 2 MG tablet Take 1 tablet (2 mg total) by mouth at bedtime. For mood control 30 tablet 0 11/25/2016 at Unknown time  . [DISCONTINUED] traZODone (DESYREL) 100 MG tablet Take 2 tablets (200 mg total) by mouth at bedtime. For sleep 60 tablet 0 11/25/2016 at Unknown time    Patient Stressors: Financial difficulties Loss of housing  Patient Strengths: Average or above average intelligence Communication skills Motivation for treatment/growth Supportive family/friends  Treatment Modalities: Medication Management, Group therapy, Case management,  1 to 1 session with clinician, Psychoeducation, Recreational therapy.   Physician Treatment Plan for Primary Diagnosis: MDD RECURRENT SEVERE WITHOUT PSYCHOTIC FEATURES Long Term Goal(s): Improvement in symptoms so as ready for discharge Improvement in symptoms so as ready for discharge   Short Term Goals: Ability to identify changes in lifestyle to reduce recurrence of condition will improve Ability to verbalize feelings will improve Ability  to disclose and discuss suicidal ideas Ability to demonstrate self-control will improve Ability to identify and develop effective coping behaviors will improve Ability to maintain clinical measurements within normal limits will improve Ability to identify triggers associated with substance abuse/mental health issues will improve  Medication Management: Evaluate patient's response, side effects, and tolerance of medication regimen.  Therapeutic Interventions: 1 to 1  sessions, Unit Group sessions and Medication administration.  Evaluation of Outcomes: Met  Physician Treatment Plan for Secondary Diagnosis: Principal Problem:   MDD (major depressive disorder), recurrent severe, without psychosis ([place])  Long Term Goal(s): Improvement in symptoms so as ready for discharge Improvement in symptoms so as ready for discharge   Short Term Goals: Ability to identify changes in lifestyle to reduce recurrence of condition will improve Ability to verbalize feelings will improve Ability to disclose and discuss suicidal ideas Ability to demonstrate self-control will improve Ability to identify and develop effective coping behaviors will improve Ability to maintain clinical measurements within normal limits will improve Ability to identify triggers associated with substance abuse/mental health issues will improve     Medication Management: Evaluate patient's response, side effects, and tolerance of medication regimen.  Therapeutic Interventions: 1 to 1 sessions, Unit Group sessions and Medication administration.  Evaluation of Outcomes: Met   RN Treatment Plan for Primary Diagnosis:MDD RECURRENT SEVERE WITHOUT PSYCHOTIC FEATURES Long Term Goal(s): Knowledge of disease and therapeutic regimen to maintain health will improve  Short Term Goals: Ability to remain free from injury will improve, Ability to verbalize feelings will improve and Ability to disclose and discuss suicidal ideas  Medication Management: RN will administer medications as ordered by provider, will assess and evaluate patient's response and provide education to patient for prescribed medication. RN will report any adverse and/or side effects to prescribing provider.  Therapeutic Interventions: 1 on 1 counseling sessions, Psychoeducation, Medication administration, Evaluate responses to treatment, Monitor vital signs and CBGs as ordered, Perform/monitor CIWA, COWS, AIMS and Fall Risk screenings as  ordered, Perform wound care treatments as ordered.  Evaluation of Outcomes: Met   LCSW Treatment Plan for Primary Diagnosis: MDD RECURRENT SEVERE WITHOUT PSYCHOTIC FEATURES Long Term Goal(s): Safe transition to appropriate next level of care at discharge, Engage patient in therapeutic group addressing interpersonal concerns.  Short Term Goals: Engage patient in aftercare planning with referrals and resources, Facilitate patient progression through stages of change regarding substance use diagnoses and concerns and Identify triggers associated with mental health/substance abuse issues  Therapeutic Interventions: Assess for all discharge needs, 1 to 1 time with Social worker, Explore available resources and support systems, Assess for adequacy in community support network, Educate family and significant other(s) on suicide prevention, Complete Psychosocial Assessment, Interpersonal group therapy.  Evaluation of Outcomes: Met   Progress in Treatment: Attending groups: Yes. Participating in groups: Yes. Taking medication as prescribed: Yes. Toleration medication: Yes. Family/Significant other contact made: SPe completed with pt; pt declined to consent to family contact.  Patient understands diagnosis: Yes. Discussing patient identified problems/goals with staff: Yes. Medical problems stabilized or resolved: Yes. Denies suicidal/homicidal ideation: Yes. Issues/concerns per patient self-inventory: No. Other: n/a   New problem(s) identified: No, Describe:  n/a   New Short Term/Long Term Goal(s): none identified   Discharge Plan or Barriers: pt has been given [facility] list; [facility] application, [facility] pamphlet, mental health association of [place] pamphlet. She was encouraged to call [facility]. Pt also referred to [facility] for [facility]. Pt and CSW spoke with Cloyde Reams [phone] at local [facility] who states she will call CSW on Friday to schedule an interview for Friday  evening. Pt can discharge directly to this meeting.  6/8: Pt has screening at [facility] on Monday. Taxi voucher in chart. She is aware that she may not meet criteria. She also has an interview with an [place] at 12pm on Monday and plans to attend "if [facility] doesn't work out."   Reason for Continuation of Hospitalization: Medication management   Estimated Length of Stay: 2 days (7am discharge scheduled for Monday).   Attendees: Patient: 12/07/2016 3:52 PM  Physician: Dr. Sanjuana Letters MD 12/07/2016 3:52 PM  Nursing: Juluis Mire RN 12/07/2016 3:52 PM  RN Care Manager: Lars Pinks CM 12/07/2016 3:52 PM  Social Worker: Press photographer, LCSW 12/07/2016 3:52 PM  Recreational Therapist: x 12/07/2016 3:52 PM  Other: Lindell Spar NP 12/07/2016 3:52 PM  Other:  12/07/2016 3:52 PM  Other: 12/07/2016 3:52 PM    Scribe for Treatment Team: [place], LCSW 12/07/2016 3:52 PM

## 2016-12-07 NOTE — [facility] Group Notes (Signed)
[place] LCSW Group Therapy  12/07/2016 3:48 PM  Type of Therapy:  Group Therapy  Participation Level:  Active  Participation Quality:  Attentive  Affect:  Appropriate  Cognitive:  Alert and Oriented  Insight:  Engaged  Engagement in Therapy:  Engaged  Modes of Intervention:  Discussion, Education, Exploration, Socialization and Support  Summary of Progress/Problems: Feelings around Relapse. Group members discussed the meaning of relapse and shared personal stories of relapse, how it affected them and others, and how they perceived themselves during this time. Group members were encouraged to identify triggers, warning signs and coping skills used when facing the possibility of relapse. Social supports were discussed and explored in detail. Post Acute Withdrawal Syndrome (handout provided) was introduced and examined. Pt's were encouraged to ask questions, talk about key points associated with PAWS, and process this information in terms of relapse prevention.   Zoeie Ritter N Smart LCSW 12/07/2016, 3:48 PM

## 2016-12-07 NOTE — Progress Notes (Signed)
Pt was informed that although she has a screening at [facility] on Monday, she may not meet criteria for admission being that she has not relapsed in over a month. Pt verbalized understanding above but states, "I'll take my chances." Pt also informed that she has an interview with an [place] in [place] at 12pm on Monday and plans to go to that "if [facility] doesn't work out." [employer] and a few [employer] bus passes in chart. PATIENT MUST DISCHARGE BY 7AM ON Monday VIA [facility] TO MAKE HER 745AM APPT AT [facility]. SHE NEEDS 2 WEEK SUPPLY OF MEDICATIONS.  Maxie Better, MSW, LCSW Clinical Social Worker 12/07/2016 3:51 PM

## 2016-12-07 NOTE — Progress Notes (Signed)
Nursing Note 12/07/2016 [id number]  Data Reports sleeping fair without PRN sleep med.  Rates depression 5/10, hopelessness 4/10, and anxiety 5/10. Affect blunted.  Denies HI and AVH.  Endorses ongoing chronic SI, states she can call her son if she was suicidal outside of here.  Attending groups, appropriate and polite with peers.  Action Spoke with patient 1:1, nurse offered support to patient throughout shift.  Continues to be monitored on 15 minute checks for safety.  Response Remains safe and appropriate on unit.

## 2016-12-07 NOTE — Progress Notes (Signed)
[facility] MD Progress Note  12/07/2016 3:13 PM [name]  MRN:  [id number] Subjective:  56 yo AAF, single, homeless. Background history Depression and substance use. Self presented for voluntary admission. Recently got out of [facility]. Was having thoughts of relapsing and thoughts of suicide. UDS and BAL were negative at admission.  Chart reviewed today. Patient discussed at team  Staff reports that patient has been doing well. No issues. Placement is still an issue. Likely none would be available till Monday. She has expressed worries she would relapse and get suicidal.   Seen today. Hopes to get placed soon. No evidence of depression. No evidence of mania. No evidence of psychosis.  No thougts of violence. No suicidal or homicidal thoughts.  Principal Problem: MDD (major depressive disorder), recurrent severe, without psychosis ([place]) Diagnosis:   Patient Active Problem List   Diagnosis Date Noted  . MDD (major depressive disorder), recurrent severe, without psychosis ([place]) [F33.2] 11/03/2016  . Alcohol-induced mood disorder with depressive symptoms ([place]) [F10.94] 10/26/2016  . Poor dentition [K08.9] 06/26/2016  . Essential hypertension [I10] 06/26/2016  . Hyperlipidemia [E78.5] 06/26/2016  . Elevated transaminase level [R74.0] 08/29/2015  . Health care maintenance [Z00.00] 12/31/2013  . Polysubstance abuse (h/o cocaine, ETOH, and tobacco abuse)  [F19.10]   . Primary vulvar squamous cell carcinoma s/p resection 2011 [C51.9] 02/13/2010  . 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 TOBACCO ABUSE [F17.200] 04/12/2006   Total Time spent with patient: 20 minutes  Past Psychiatric History: As in H&P  Past Medical History:  Past Medical History:  Diagnosis Date  . Cocaine abuse   . ETOH abuse   . Hypothyroidism   . Unspecified mood (affective) disorder [facility])     Past Surgical History:  Procedure Laterality Date  . radioactive iodine ablation     graves disease s/p   Family  History: History reviewed. No pertinent family history. Family Psychiatric  History: As in H&P Social History:  History  Alcohol Use No    Comment: Last drink: In the last month.      History  Drug Use No    Comment: Used Crack. Last used: Earlier in the month.     Social History   Social History  . Marital status: Single    Spouse name: N/A  . Number of children: N/A  . Years of education: N/A   Social History Main Topics  . Smoking status: Current Every Day Smoker    Packs/day: 0.30    Types: Cigarettes  . Smokeless tobacco: Never Used  . Alcohol use No     Comment: Last drink: In the last month.   . Drug use: No     Comment: Used Crack. Last used: Earlier in the month.   . Sexual activity: Not Asked   Other Topics Concern  . None   Social History Narrative  . None   Additional Social History:          Sleep: Good  Appetite:  Good  Current Medications: Current Facility-Administered Medications  Medication Dose Route Frequency Provider Last Rate Last Dose  . alum & mag hydroxide-simeth (MAALOX/MYLANTA) 200-200-20 MG/5ML suspension 30 mL  30 mL Oral Q4H PRN Withrow, [name], FNP      . citalopram (CELEXA) tablet 40 mg  40 mg Oral Daily Derrill [place], NP   40 mg at 12/07/16 6962  . hydrOXYzine (ATARAX/VISTARIL) tablet 25 mg  25 mg Oral Q6H PRN [name], FNP   25 mg at 12/02/16  2114  . ibuprofen (ADVIL,MOTRIN) tablet 600 mg  600 mg Oral Q8H PRN [name], FNP   600 mg at 12/07/16 4401  . levothyroxine (SYNTHROID, LEVOTHROID) tablet 75 mcg  75 mcg Oral Q0600 [name], FNP   75 mcg at 12/07/16 0272  . magnesium hydroxide (MILK OF MAGNESIA) suspension 30 mL  30 mL Oral Daily PRN Withrow, [name], FNP      . nicotine (NICODERM CQ - dosed in mg/24 hours) patch 21 mg  21 mg Transdermal Daily [name], FNP   21 mg at 12/07/16 5366  . ondansetron (ZOFRAN) tablet 4 mg  4 mg Oral Q8H PRN Withrow, [name], FNP      . risperiDONE (RISPERDAL) tablet  2 mg  2 mg Oral QHS [name], FNP   2 mg at 12/06/16 2153  . traZODone (DESYREL) tablet 200 mg  200 mg Oral QHS Withrow, [name], FNP   200 mg at 12/06/16 2153    Lab Results: No results found for this or any previous visit (from the past 48 hour(s)).  Blood Alcohol level:  Lab Results  Component Value Date   ETH <5 11/26/2016   ETH 208 (H) 44/08/4740    Metabolic Disorder Labs: No results found for: HGBA1C, MPG No results found for: PROLACTIN Lab Results  Component Value Date   CHOL 191 09/12/2011   TRIG 64 09/12/2011   HDL 66 09/12/2011   CHOLHDL 2.9 09/12/2011   VLDL 13 09/12/2011   LDLCALC 112 (H) 09/12/2011   LDLCALC  12/26/2008    71        Total Cholesterol/HDL:CHD Risk Coronary Heart Disease Risk Table                     Men   Women  1/2 Average Risk   3.4   3.3  Average Risk       5.0   4.4  2 X Average Risk   9.6   7.1  3 X Average Risk  23.4   11.0        Use the calculated Patient Ratio above and the CHD Risk Table to determine the patient's CHD Risk.        ATP III CLASSIFICATION (LDL):  <100     mg/dL   Optimal  100-129  mg/dL   Near or Above                    Optimal  130-159  mg/dL   Borderline  160-189  mg/dL   High  >190     mg/dL   Very High    Physical Findings: AIMS: Facial and Oral Movements Muscles of Facial Expression: None, normal Lips and Perioral Area: None, normal Jaw: None, normal Tongue: None, normal,Extremity Movements Upper (arms, wrists, hands, fingers): None, normal Lower (legs, knees, ankles, toes): None, normal, Trunk Movements Neck, shoulders, hips: None, normal, Overall Severity Severity of abnormal movements (highest score from questions above): None, normal Incapacitation due to abnormal movements: None, normal Patient's awareness of abnormal movements (rate only patient's report): No Awareness, Dental Status Current problems with teeth and/or dentures?: No Does patient usually wear dentures?: No  CIWA:   CIWA-Ar Total: 1 COWS:  COWS Total Score: 1  Musculoskeletal: Strength & Muscle Tone: within normal limits Gait & Station: normal Patient leans: N/A  Psychiatric Specialty Exam: Physical Exam  Constitutional: She appears well-developed and well-nourished.  HENT:  Head: Normocephalic and  atraumatic.  Eyes: Pupils are equal, round, and reactive to light.  Neck: Normal range of motion. Neck supple.  Cardiovascular: Normal rate.   Respiratory: Effort normal and breath sounds normal.  GI: Soft. Bowel sounds are normal.  Musculoskeletal: Normal range of motion.  Neurological: She is alert.  Skin: Skin is warm and dry.  Psychiatric:  As above    ROS  Blood pressure 110/71, pulse 100, temperature 97.8 F (36.6 C), temperature source Oral, resp. rate 16, height 5\' 1"  (1.549 m), weight 72.1 kg (159 lb).Body mass index is 30.04 kg/m.  General Appearance: In hospital clothing, pleasant, calm and cooperative. Appropriate behavior.   Eye Contact:  Good  Speech:  Clear and Coherent and Normal Rate  Volume:  Normal  Mood:  Euthymic  Affect:  Appropriate and Full Range  Thought Process:  Goal Directed and Linear  Orientation:  Full (Time, Place, and Person)  Thought Content:  No delusional theme. No preoccupation with violent thoughts. No negative ruminations. No obsession.  No hallucination in any modality.   Suicidal Thoughts:  No  Homicidal Thoughts:  No  Memory:  Immediate;   Good Recent;   Good Remote;   Good  Judgement:  Good  Insight:  Good  Psychomotor Activity:  Normal  Concentration:  Concentration: Good and Attention Span: Good  Recall:  Good  Fund of Knowledge:  Good  Language:  Good  Akathisia:  NA  Handed:    AIMS (if indicated):     Assets:  Communication Skills Desire for Improvement Resilience  ADL's:  Intact  Cognition:  WNL  Sleep:  Number of Hours: 6.75     Treatment Plan Summary: Patient is at baseline. No dangerousness. No evidence of depression,  psychosis or mania. For discharge as soon as we have placement.  Psychiatric: MDD recurrent Alcohol Use Disorder  Medical: HTN HLD Hypothyroidism  Psychosocial:  Homelessness Unemployed Limited support  PLAN: 1. Continue current regimen 2. Continue to monitor mood, behavior and interaction with peers 3. Discharge as soon as we have placement     [place], MD 12/07/2016, 3:13 PMPatient ID: Harmon Dun, female   DOB: 1961/05/30, 56 y.o.   MRN: [id number] Patient ID: Harmon Dun, female   DOB: [birth year], 56 y.o.   MRN: [id number] Patient ID: Harmon Dun, female   DOB: 1960/12/25, 56 y.o.   MRN: [id number]

## 2016-12-07 NOTE — Progress Notes (Signed)
  [facility] Adult Case Management Discharge Plan :  Will you be returning to the same living situation after discharge:  No. Pt going to [facility] or [facility].  At discharge, do you have transportation home?: Yes,  taxi voucher for [facility] in chart--PATIENT MUST D/C NO [place] (VOUCHER IN CHART) Do you have the ability to pay for your medications: Yes,  mental health  Release of information consent forms completed and submitted to medical records by CSW.  Patient to Follow up at: Follow-up Information    [facility] Follow up.   Specialty:  Behavioral Health Why:  Please go for a walk-in appointment within 1-3 days of discharge to be established for outpatient services. Walk-in hours are Mon-Fri 8am-3pm. Please arrive as early as possible to be sure that you are seen. Contact information: [place] [address] [phone]        [facility] Follow up.   Why:  Referral Faxed to Admissions Dept on 12/03/16. Messages left for Levada Dy in admissions to discuss referral and check bed availability.  Contact information: [address], [place] [address] Phone: [phone] Fax: [phone]       [facility], [facility] Follow up on 12/10/2016.   Why:  Screening for possible admission on Monday at 7:45AM. Please bring Photo ID (provided by hospital) and medications/prescriptions (also provided). Thank you.  Contact information: [address] [place] [address] [phone]           Next level of care provider has access to [place] and Suicide Prevention discussed: Yes,  SPE completed with pt; pt declined to consent to family contact.  Have you used any form of tobacco in the last 30 days? (Cigarettes, Smokeless Tobacco, Cigars, and/or Pipes): Yes  Has patient been referred to the Quitline?: Patient refused referral  Patient has been  referred for addiction treatment: Yes  Tiant [place]oto N Smart LCSW 12/07/2016, 3:53 PM

## 2016-12-07 NOTE — Progress Notes (Signed)
Adult Psychoeducational Group Note  Date:  12/07/2016 Time:  12:49 PM  Group Topic/Focus:  Relapse Prevention Planning:   The focus of this group is to define relapse and discuss the need for planning to combat relapse.  Participation Level:  Active  Participation Quality:  Appropriate  Affect:  Appropriate  Cognitive:  Appropriate  Insight: Appropriate  Engagement in Group:  Engaged  Modes of Intervention:  Discussion  Additional Comments:  Pt. Was willing to participate in the group activity with the extra push from peers and other staff members.  Sherlynn Carbon 12/07/2016, 12:49 PM

## 2016-12-07 NOTE — Progress Notes (Signed)
Recreation Therapy Notes  Date: 12/07/16 Time: 0930 Location: [address] Dayroom  Group Topic: Stress Management  Goal Area(s) Addresses:  Patient will verbalize importance of using healthy stress management.  Patient will identify positive emotions associated with healthy stress management.   Behavioral Response: Engaged  Intervention: Stress Management  Activity :  Guided Imagery.  LRT introduced the stress management technique of guided imagery.  LRT read a script to allow patients to take a mental vacation to escape their current surroundings.  Patients were to follow along as the script was read to fully engage in the activity.  Education:  Stress Management, Discharge Planning.   Education Outcome: Acknowledges edcuation/In group clarification offered/Needs additional education  Clinical Observations/Feedback: Pt attended group.   Victorino Sparrow, LRT/CTRS         Ria Comment, Janisa Labus A 12/07/2016 11:18 AM

## 2016-12-07 NOTE — Progress Notes (Signed)
Patient ID: [name], female   DOB: 04/24/1961, 56 y.o.   MRN: [id number]  Pt currently presents with an appropriate affect and assertive behavior. Pt reports to writer that their goal is to "make plans to leave on Monday." Pt reports good sleep with current medication regimen. Pt speech is very loud and pressured. Endorses anxiety.   Pt provided with medications per providers orders. Pt's labs and vitals were monitored throughout the night. Pt given a 1:1 about emotional and mental status. Pt supported and encouraged to express concerns and questions. Pt educated on medications.  Pt's safety ensured with 15 minute and environmental checks. Pt currently denies SI/HI and A/V hallucinations. Pt verbally agrees to seek staff if SI/HI or A/VH occurs and to consult with staff before acting on any harmful thoughts. Will continue POC.

## 2016-12-08 NOTE — Progress Notes (Signed)
[facility] MD Progress Note  12/08/2016 3:44 PM [name]  MRN:  [id number] Subjective:  56 yo AAF, single, homeless. Background history Depression and substance use. Self presented for voluntary admission. Recently got out of [facility]. Was having thoughts of relapsing and thoughts of suicide. UDS and BAL were negative at admission.  Chart reviewed today. Patient discussed at team  Staff reports that patient has been appropriate on the unit. Patient has been interacting well with peers. No behavioral issues. Patient has not voiced any suicidal thoughts. Patient has not been observed to be internally stimulated. Patient has been adherent with treatment recommendations. Patient has been tolerating their medication well.  She has been accepted into sober housing for Monday  Seen today. Pleased she has secured placement.  No evidence of depression. No evidence of mania. No evidence of psychosis.  No thougts of violence. No suicidal or homicidal thoughts.  Principal Problem: MDD (major depressive disorder), recurrent severe, without psychosis ([place]) Diagnosis:   Patient Active Problem List   Diagnosis Date Noted  . MDD (major depressive disorder), recurrent severe, without psychosis ([place]) [F33.2] 11/03/2016  . Alcohol-induced mood disorder with depressive symptoms ([place]) [F10.94] 10/26/2016  . Poor dentition [K08.9] 06/26/2016  . Essential hypertension [I10] 06/26/2016  . Hyperlipidemia [E78.5] 06/26/2016  . Elevated transaminase level [R74.0] 08/29/2015  . Health care maintenance [Z00.00] 12/31/2013  . Polysubstance abuse (h/o cocaine, ETOH, and tobacco abuse)  [F19.10]   . Primary vulvar squamous cell carcinoma s/p resection 2011 [C51.9] 02/13/2010  . 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 TOBACCO ABUSE [F17.200] 04/12/2006   Total Time spent with patient: 20 minutes  Past Psychiatric History: As in H&P  Past Medical History:  Past Medical History:  Diagnosis Date  . Cocaine abuse    . ETOH abuse   . Hypothyroidism   . Unspecified mood (affective) disorder [facility])     Past Surgical History:  Procedure Laterality Date  . radioactive iodine ablation     graves disease s/p   Family History: History reviewed. No pertinent family history. Family Psychiatric  History: As in H&P Social History:  History  Alcohol Use No    Comment: Last drink: In the last month.      History  Drug Use No    Comment: Used Crack. Last used: Earlier in the month.     Social History   Social History  . Marital status: Single    Spouse name: N/A  . Number of children: N/A  . Years of education: N/A   Social History Main Topics  . Smoking status: Current Every Day Smoker    Packs/day: 0.30    Types: Cigarettes  . Smokeless tobacco: Never Used  . Alcohol use No     Comment: Last drink: In the last month.   . Drug use: No     Comment: Used Crack. Last used: Earlier in the month.   . Sexual activity: Not Asked   Other Topics Concern  . None   Social History Narrative  . None   Additional Social History:          Sleep: Good  Appetite:  Good  Current Medications: Current Facility-Administered Medications  Medication Dose Route Frequency Provider Last Rate Last Dose  . alum & mag hydroxide-simeth (MAALOX/MYLANTA) 200-200-20 MG/5ML suspension 30 mL  30 mL Oral Q4H PRN Withrow, [name], FNP      . citalopram (CELEXA) tablet 40 mg  40 mg Oral Daily Derrill Center, NP  40 mg at 12/08/16 0827  . hydrOXYzine (ATARAX/VISTARIL) tablet 25 mg  25 mg Oral Q6H PRN [name], FNP   25 mg at 12/02/16 2114  . ibuprofen (ADVIL,MOTRIN) tablet 600 mg  600 mg Oral Q8H PRN [name], FNP   600 mg at 12/08/16 0829  . levothyroxine (SYNTHROID, LEVOTHROID) tablet 75 mcg  75 mcg Oral Q0600 [name], FNP   75 mcg at 12/08/16 0602  . magnesium hydroxide (MILK OF MAGNESIA) suspension 30 mL  30 mL Oral Daily PRN Withrow, [name], FNP      . nicotine (NICODERM CQ - dosed in  mg/24 hours) patch 21 mg  21 mg Transdermal Daily [name], FNP   21 mg at 12/08/16 0827  . ondansetron (ZOFRAN) tablet 4 mg  4 mg Oral Q8H PRN Withrow, [name], FNP      . risperiDONE (RISPERDAL) tablet 2 mg  2 mg Oral QHS [name], FNP   2 mg at 12/07/16 2105  . traZODone (DESYREL) tablet 200 mg  200 mg Oral QHS Withrow, [name], FNP   200 mg at 12/07/16 2105    Lab Results: No results found for this or any previous visit (from the past 48 hour(s)).  Blood Alcohol level:  Lab Results  Component Value Date   ETH <5 11/26/2016   ETH 208 (H) 27/12/2374    Metabolic Disorder Labs: No results found for: HGBA1C, MPG No results found for: PROLACTIN Lab Results  Component Value Date   CHOL 191 09/12/2011   TRIG 64 09/12/2011   HDL 66 09/12/2011   CHOLHDL 2.9 09/12/2011   VLDL 13 09/12/2011   LDLCALC 112 (H) 09/12/2011   LDLCALC  12/26/2008    71        Total Cholesterol/HDL:CHD Risk Coronary Heart Disease Risk Table                     Men   Women  1/2 Average Risk   3.4   3.3  Average Risk       5.0   4.4  2 X Average Risk   9.6   7.1  3 X Average Risk  23.4   11.0        Use the calculated Patient Ratio above and the CHD Risk Table to determine the patient's CHD Risk.        ATP III CLASSIFICATION (LDL):  <100     mg/dL   Optimal  100-129  mg/dL   Near or Above                    Optimal  130-159  mg/dL   Borderline  160-189  mg/dL   High  >190     mg/dL   Very High    Physical Findings: AIMS: Facial and Oral Movements Muscles of Facial Expression: None, normal Lips and Perioral Area: None, normal Jaw: None, normal Tongue: None, normal,Extremity Movements Upper (arms, wrists, hands, fingers): None, normal Lower (legs, knees, ankles, toes): None, normal, Trunk Movements Neck, shoulders, hips: None, normal, Overall Severity Severity of abnormal movements (highest score from questions above): None, normal Incapacitation due to abnormal movements: None,  normal Patient's awareness of abnormal movements (rate only patient's report): No Awareness, Dental Status Current problems with teeth and/or dentures?: No Does patient usually wear dentures?: No  CIWA:  CIWA-Ar Total: 1 COWS:  COWS Total Score: 1  Musculoskeletal: Strength & Muscle Tone: within  normal limits Gait & Station: normal Patient leans: N/A  Psychiatric Specialty Exam: Physical Exam  Constitutional: She appears well-developed and well-nourished.  HENT:  Head: Normocephalic and atraumatic.  Eyes: Pupils are equal, round, and reactive to light.  Neck: Normal range of motion. Neck supple.  Cardiovascular: Normal rate.   Respiratory: Effort normal and breath sounds normal.  GI: Soft. Bowel sounds are normal.  Musculoskeletal: Normal range of motion.  Neurological: She is alert.  Skin: Skin is warm and dry.  Psychiatric:  As above    ROS  Blood pressure 133/61, pulse 99, temperature 97.7 F (36.5 C), temperature source Oral, resp. rate (!) 24, height 5\' 1"  (1.549 m), weight 72.1 kg (159 lb).Body mass index is 30.04 kg/m.  General Appearance: In hospital clothing, pleasant, calm and cooperative. Appropriate behavior.   Eye Contact:  Good  Speech:  Clear and Coherent and Normal Rate  Volume:  Normal  Mood:  Euthymic  Affect:  Appropriate and Full Range  Thought Process:  Goal Directed and Linear  Orientation:  Full (Time, Place, and Person)  Thought Content:  No delusional theme. No preoccupation with violent thoughts. No negative ruminations. No obsession.  No hallucination in any modality.   Suicidal Thoughts:  No  Homicidal Thoughts:  No  Memory:  Immediate;   Good Recent;   Good Remote;   Good  Judgement:  Good  Insight:  Good  Psychomotor Activity:  Normal  Concentration:  Concentration: Good and Attention Span: Good  Recall:  Good  Fund of Knowledge:  Good  Language:  Good  Akathisia:  NA  Handed:    AIMS (if indicated):     Assets:  Communication  Skills Desire for Improvement Resilience  ADL's:  Intact  Cognition:  WNL  Sleep:  Number of Hours: 6.75     Treatment Plan Summary: Patient is at baseline. No dangerousness. No evidence of depression, psychosis or mania. For discharge on Monday.  Psychiatric: MDD recurrent Alcohol Use Disorder  Medical: HTN HLD Hypothyroidism  Psychosocial:  Homelessness Unemployed Limited support  PLAN: 1. Continue current regimen 2. Continue to monitor mood, behavior and interaction with peers    [place]ist [place], MD 12/08/2016, 3:44 PMPatient ID: [name], female   DOB: [birth year], 56 y.o.   MRN: [id number] Patient ID: [name], female   DOB: 24-Dec-1960, 56 y.o.   MRN: [id number] Patient ID: [name], female   DOB: 01/09/1961, 56 y.o.   MRN: [id number] Patient ID: [name], female   DOB: 13-Dec-1960, 56 y.o.   MRN: [id number]

## 2016-12-08 NOTE — Plan of Care (Signed)
Problem: Education: Goal: Verbalization of understanding the information provided will improve Outcome: Progressing Patient verbalizes understanding of education, information provided.

## 2016-12-08 NOTE — Progress Notes (Signed)
D: Pt denies SI/HI/AVH. Pt is pleasant and cooperative. Pt stated she was ready to leave Monday, pt seen interacting with peers on the unit  A: Pt was offered support and encouragement. Pt was given scheduled medications. Pt was encourage to attend groups. Q 15 minute checks were done for safety.   R:Pt attends groups and interacts well with peers and staff. Pt is taking medication. Pt has no complaints at this time .Pt receptive to treatment and safety maintained on unit.

## 2016-12-08 NOTE — Plan of Care (Signed)
Problem: Coping: Goal: Ability to cope will improve Outcome: Progressing Pt seen interacting on the unit with peers

## 2016-12-08 NOTE — BHH Group Notes (Signed)
[place] Group Notes: (Clinical Social Work)   12/08/2016      Type of Therapy:  Group Therapy   Participation Level:  Did Not Attend despite MHT prompting - she did come in for the last 5-10 minutes of group, sat silently and listened   Selmer Dominion, [place] 12/08/2016, 3:30 PM

## 2016-12-08 NOTE — Progress Notes (Signed)
Patient attended group and said that her day was a 10.  Something positive about her day was she got to watch [employer] channel.

## 2016-12-08 NOTE — Progress Notes (Signed)
D: Spoke with patient 1:1 who is visible in the milieu. Patient's affect animated, mood anxious. Rating depression at a 10/10, hopelessness at a 9/10 and anxiety at an 8/10. Rates sleep as fair, appetite as fair, energy as normal and concentration as good.  States goal for today is to "go to a place, go to a meeting." Complaining of L knee pain, no other physical complaints.   A: Medicated per orders, prn advil given. Emotional support offered and self inventory reviewed. Encouraged completion of Suicide Safety Plan. Discussed POC with MD, SW.  R: Patient verbalizes understanding of POC. Patient denies SI/HI and remains safe on level III obs.

## 2016-12-08 NOTE — BHH Group Notes (Signed)
[place] Group Notes:  (Nursing/MHT/Case Management/Adjunct)  Date:  12/08/2016  Time:  1315  Type of Therapy:  Nurse Education  Participation Level:  Active  Participation Quality:  Redirectable  Affect:  Animated  Cognitive:  Alert  Insight:  Limited  Engagement in Group:  Engaged  Modes of Intervention:  Discussion and Education  Summary of Progress/Problems: Nurse led exercise on improving, strengthening self esteem.   [name] 12/08/2016, 2:35 PM

## 2016-12-09 NOTE — Discharge Summary (Signed)
Physician Discharge Summary Note  Patient:  [name] is an 56 y.o., female MRN:  [id number] DOB:  06-24-1961 Patient phone:  [phone] (home)  Patient address:   [place] [address],  Total Time spent with patient: 30 minutes  Date of Admission:  11/27/2016 Date of Discharge: 12/10/2016  Reason for Admission:  Per H&P-This is a re-admission assessment for this 56 year old AA female with hx of alcohol & drug addiction. She was discharged from this hospital on 11-07-16 & sent to the [facility] for further substance abuse treatment. Patient is being re-admitted for worsening symptoms of depression & suicidal ideations. During this assessment, [name] reports, "Someone dropped me off to the [facility] ED because I was thinking of killing myself. I had already swallowed 3 tablets of my sleeping pills, had to stop myself because I needed help. After graduating from [employer] 3 weeks ago, I was suppose to go to the home of the lady that I met in this hospital. This lady told me that if I can come live in her house, she will charge me a little bit money every month to stay there. After I got out of [employer], I tried to go to this lady's house as she promised me, but the plan fell through. And because I did not have a place to live, I was afraid that I'm going relapse. I became suicidal & took the three sleeping pills, but had to stop myself because I want to go to a half-way house after discharge this time. I need help".  Principal Problem: MDD (major depressive disorder), recurrent severe, without psychosis [facility]) Discharge Diagnoses: Patient Active Problem List   Diagnosis Date Noted  . MDD (major depressive disorder), recurrent severe, without psychosis ([place]) [F33.2] 11/03/2016  . Alcohol-induced mood disorder with depressive symptoms ([place]) [F10.94] 10/26/2016  . Poor dentition [K08.9] 06/26/2016  . Essential hypertension [I10] 06/26/2016  . Hyperlipidemia [E78.5]  06/26/2016  . Elevated transaminase level [R74.0] 08/29/2015  . Health care maintenance [Z00.00] 12/31/2013  . Polysubstance abuse (h/o cocaine, ETOH, and tobacco abuse)  [F19.10]   . Primary vulvar squamous cell carcinoma s/p resection 2011 [C51.9] 02/13/2010  . 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 TOBACCO ABUSE [F17.200] 04/12/2006    Past Psychiatric History:   Past Medical History:  Past Medical History:  Diagnosis Date  . Cocaine abuse   . ETOH abuse   . Hypothyroidism   . Unspecified mood (affective) disorder [facility])     Past Surgical History:  Procedure Laterality Date  . radioactive iodine ablation     graves disease s/p   Family History: History reviewed. No pertinent family history. Family Psychiatric  History:  Social History:  History  Alcohol Use No    Comment: Last drink: In the last month.      History  Drug Use No    Comment: Used Crack. Last used: Earlier in the month.     Social History   Social History  . Marital status: Single    Spouse name: N/A  . Number of children: N/A  . Years of education: N/A   Social History Main Topics  . Smoking status: Current Every Day Smoker    Packs/day: 0.30    Types: Cigarettes  . Smokeless tobacco: Never Used  . Alcohol use No     Comment: Last drink: In the last month.   . Drug use: No     Comment: Used Crack. Last used: Earlier  in the month.   . Sexual activity: Not Asked   Other Topics Concern  . None   Social History Narrative  . None    Hospital Course: Alis L Haire was admitted for MDD (major depressive disorder), recurrent severe, without psychosis ([facility])  and crisis management.  Pt was treated discharged with the medications listed below under Medication List.  Medical problems were identified and treated as needed.  Home medications were restarted as appropriate.  Improvement was monitored by observation and Shailynn L Morella 's daily report of symptom reduction.  Emotional and  mental status was monitored by daily self-inventory reports completed by Kalayla L Steinhart and clinical staff.         Syanna L Caudill was evaluated by the treatment team for stability and plans for continued recovery upon discharge. Percilla L Roadcap 's motivation was an integral factor for scheduling further treatment. Employment, transportation, bed availability, health status, family support, and any pending legal issues were also considered during hospital stay. Pt was offered further treatment options upon discharge including but not limited to Residential, Intensive Outpatient, and Outpatient treatment.  Meah L Montfort will follow up with the services as listed below under Follow Up Information.     Upon completion of this admission the patient was both mentally and medically stable for discharge denying suicidal/homicidal ideation, auditory/visual/tactile hallucinations, delusional thoughts and paranoia.     Vester L Iversen responded well to treatment with Celexa 40mg, Risperdal 2 mg and Trazodone 200 mg without adverse effects. Pt demonstrated improvement without reported or observed adverse effects to the point of stability appropriate for outpatient management. Pertinent labs include: Hepatic function panel and CMP, for which outpatient follow-up is necessary for lab recheck as mentioned below. Reviewed CBC, CMP, BAL, and UDS+ 208 on admission; all unremarkable aside from noted exceptions.   Physical Findings: AIMS: Facial and Oral Movements Muscles of Facial Expression: None, normal Lips and Perioral Area: None, normal Jaw: None, normal Tongue: None, normal,Extremity Movements Upper (arms, wrists, hands, fingers): None, normal Lower (legs, knees, ankles, toes): None, normal, Trunk Movements Neck, shoulders, hips: None, normal, Overall Severity Severity of abnormal movements (highest score from questions above): None, normal Incapacitation due to abnormal movements: None,  normal Patient's awareness of abnormal movements (rate only patient's report): No Awareness, Dental Status Current problems with teeth and/or dentures?: No Does patient usually wear dentures?: No  CIWA:  CIWA-Ar Total: 1 COWS:  COWS Total Score: 1  Musculoskeletal: Strength & Muscle Tone: within normal limits Gait & Station: normal Patient leans: N/A  Psychiatric Specialty Exam: See SRA by MD Physical Exam  Nursing note and vitals reviewed. Constitutional: She is oriented to person, place, and time. She appears well-developed.  Cardiovascular: Normal rate.   Neurological: She is alert and oriented to person, place, and time.  Psychiatric: She has a normal mood and affect. Her behavior is normal.    Review of Systems  Psychiatric/Behavioral: Negative for depression (stable). The patient is not nervous/anxious.     Blood pressure 128/84, pulse 93, temperature 97.5 F (36.4 C), resp. rate 20, height 5' 1" (1.549 m), weight 72.1 kg (159 lb).Body mass index is 30.04 kg/m.    Have you used any form of tobacco in the last 30 days? (Cigarettes, Smokeless Tobacco, Cigars, and/or Pipes): Yes  Has this patient used any form of tobacco in the last 30 days? (Cigarettes, Smokeless Tobacco, Cigars, and/or Pipes)  No  Blood Alcohol level:  Lab Results  Component Value Date     ETH <5 11/26/2016   ETH 208 (H) 43/32/9518    Metabolic Disorder Labs:  No results found for: HGBA1C, MPG No results found for: PROLACTIN Lab Results  Component Value Date   CHOL 191 09/12/2011   TRIG 64 09/12/2011   HDL 66 09/12/2011   CHOLHDL 2.9 09/12/2011   VLDL 13 09/12/2011   LDLCALC 112 (H) 09/12/2011   LDLCALC  12/26/2008    71        Total Cholesterol/HDL:CHD Risk Coronary Heart Disease Risk Table                     Men   Women  1/2 Average Risk   3.4   3.3  Average Risk       5.0   4.4  2 X Average Risk   9.6   7.1  3 X Average Risk  23.4   11.0        Use the calculated Patient Ratio above  and the CHD Risk Table to determine the patient's CHD Risk.        ATP III CLASSIFICATION (LDL):  <100     mg/dL   Optimal  100-129  mg/dL   Near or Above                    Optimal  130-159  mg/dL   Borderline  160-189  mg/dL   High  >190     mg/dL   Very High    See Psychiatric Specialty Exam and Suicide Risk Assessment completed by Attending Physician prior to discharge.  Discharge destination:  [facility]  Is patient on multiple antipsychotic therapies at discharge:  No   Has Patient had three or more failed trials of antipsychotic monotherapy by history:  No  Recommended Plan for Multiple Antipsychotic Therapies: NA  Discharge Instructions    Diet - low sodium heart healthy    Complete by:  As directed    Discharge instructions    Complete by:  As directed    Take all medications as prescribed. Keep all follow-up appointments as scheduled.  Do not consume alcohol or use illegal drugs while on prescription medications. Report any adverse effects from your medications to your primary care provider promptly.  In the event of recurrent symptoms or worsening symptoms, call 911, a crisis hotline, or go to the nearest emergency department for evaluation.   Increase activity slowly    Complete by:  As directed      Allergies as of 12/09/2016   No Known Allergies     Medication List    TAKE these medications     Indication  citalopram 40 MG tablet Commonly known as:  CELEXA Take 1 tablet (40 mg total) by mouth daily. For depression What changed:  medication strength  how much to take  Indication:  Depression   hydrOXYzine 25 MG tablet Commonly known as:  ATARAX/VISTARIL Take 1 tablet (25 mg total) by mouth every 6 (six) hours as needed for anxiety.  Indication:  Anxiety Neurosis   levothyroxine 75 MCG tablet Commonly known as:  SYNTHROID, LEVOTHROID Take 1 tablet (75 mcg total) by mouth daily at 6 (six) AM. For low functioning thyroid  Indication:   Underactive Thyroid   nicotine 21 mg/24hr patch Commonly known as:  NICODERM CQ - dosed in mg/24 hours Place 1 patch (21 mg total) onto the skin daily. For smoking cessation  Indication:  Nicotine Addiction   risperiDONE 2 MG tablet  Commonly known as:  RISPERDAL Take 1 tablet (2 mg total) by mouth at bedtime. For mood control  Indication:  Mood control   traZODone 100 MG tablet Commonly known as:  DESYREL Take 2 tablets (200 mg total) by mouth at bedtime. For sleep  Indication:  Trouble Sleeping      Follow-up Information    [facility] Follow up.   Specialty:  Behavioral Health Why:  Please go for a walk-in appointment within 1-3 days of discharge to be established for outpatient services. Walk-in hours are Mon-Fri 8am-3pm. Please arrive as early as possible to be sure that you are seen. Contact information: [address] [place] [address] [phone]        [facility] Follow up.   Why:  Referral Faxed to Admissions Dept on 12/03/16. Messages left for Angela in admissions to discuss referral and check bed availability.  Contact information: [address] [place], [place] [address] Phone: [phone] Fax: [phone]       Services, [facility] Follow up on 12/10/2016.   Why:  Screening for possible admission on Monday at 7:45AM. Please bring Photo ID (provided by hospital) and medications/prescriptions (also provided). Thank you.  Contact information: [address] [place] [address] [phone]           Follow-up recommendations:  Activity:  as tolerated Diet:  heart healthy  Comments: Take all medications as prescribed. Keep all follow-up appointments as scheduled.  Do not consume alcohol or use illegal drugs while on prescription medications. Report any adverse effects from your medications to your primary care provider promptly.  In the event of recurrent symptoms or worsening symptoms,  call 911, a crisis hotline, or go to the nearest emergency department for evaluation.   Signed:  N , NP 12/09/2016, 9:16 PM 

## 2016-12-09 NOTE — Progress Notes (Signed)
Patient did attend the evening speaker AA meeting.  

## 2016-12-09 NOTE — BHH Group Notes (Signed)
[place] LCSW Group Therapy  12/09/2016 11 AM  Type of Therapy:  Group Therapy  Participation Level:  Minimal  Participation Quality:  Attentive  Affect:  Appropriate  Cognitive:  Appropriate  Insight:  Developing/Improving  Engagement in Therapy:  Developing/Improving  Modes of Intervention:  Discussion, Exploration, Problem-solving, Socialization and Support  Summary of Progress/Problems: Topic for today was thoughts and feelings regarding discharge. We discussed fears of upcoming changes including judgements, expectations and stigma of mental health issues. We then discussed supports: what constitutes a supportive framework, identification of supports and what to do when others are not supportive. Patients then processed the concept of self care and what that might look like.   Sheilah Pigeon, LCSW

## 2016-12-09 NOTE — BHH Group Notes (Signed)
[place] Group Notes:  (Nursing/MHT/Case Management/Adjunct)  Date:  12/09/2016  Time:  4:21 PM  Type of Therapy:  Psychoeducational Skills  Participation Level:  Active  Participation Quality:  Appropriate  Affect:  Appropriate  Cognitive:  Appropriate  Insight:  Appropriate  Engagement in Group:  Engaged  Modes of Intervention:  Problem-solving Summary of Progress/Problems: Group encouraged to surround themselves with positive and healthy group/support system when changing to a healthy life style.       [name] 12/09/2016, 4:21 PM

## 2016-12-09 NOTE — BHH Suicide Risk Assessment (Signed)
[facility] Discharge Suicide Risk Assessment   Principal Problem: MDD (major depressive disorder), recurrent severe, without psychosis ([place]) Discharge Diagnoses:  Patient Active Problem List   Diagnosis Date Noted  . MDD (major depressive disorder), recurrent severe, without psychosis ([place]) [F33.2] 11/03/2016  . Alcohol-induced mood disorder with depressive symptoms ([place]) [F10.94] 10/26/2016  . Poor dentition [K08.9] 06/26/2016  . Essential hypertension [I10] 06/26/2016  . Hyperlipidemia [E78.5] 06/26/2016  . Elevated transaminase level [R74.0] 08/29/2015  . Health care maintenance [Z00.00] 12/31/2013  . Polysubstance abuse (h/o cocaine, ETOH, and tobacco abuse)  [F19.10]   . Primary vulvar squamous cell carcinoma s/p resection 2011 [C51.9] 02/13/2010  . 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 TOBACCO ABUSE [F17.200] 04/12/2006    Total Time spent with patient: 45 minutes  Musculoskeletal: Strength & Muscle Tone: within normal limits Gait & Station: normal Patient leans: N/A  Psychiatric Specialty Exam: Review of Systems  Constitutional: Negative.   HENT: Negative.   Eyes: Negative.   Respiratory: Negative.   Cardiovascular: Negative.   Gastrointestinal: Negative.   Genitourinary: Negative.   Musculoskeletal: Negative.   Skin: Negative.   Neurological: Negative.   Endo/Heme/Allergies: Negative.   Psychiatric/Behavioral: Negative for depression, hallucinations, memory loss, substance abuse and suicidal ideas. The patient is not nervous/anxious and does not have insomnia.     Blood pressure 128/84, pulse 93, temperature 97.5 F (36.4 C), resp. rate 20, height 5\' 1"  (1.549 m), weight 72.1 kg (159 lb).Body mass index is 30.04 kg/m.  General Appearance: In hospital clothing, pleasant, engaging well and cooperative. Appropriate behavior. Not in any distress. Good relatedness. Not internally stimulated  Eye Contact::  Good  Speech: Spontaneous, normal prosody. Normal tone  and rate.   Volume:  Normal  Mood:  Euthymic  Affect:  Appropriate and Full Range  Thought Process:  Goal Directed and Linear  Orientation:  Full (Time, Place, and Person)  Thought Content:  Future oriented. No delusional theme. No preoccupation with violent thoughts. No negative ruminations. No obsession.  No hallucination in any modality.   Suicidal Thoughts:  No  Homicidal Thoughts:  No  Memory:  Immediate;   Good Recent;   Good Remote;   Good  Judgement:  Good  Insight:  Good  Psychomotor Activity:  Normal  Concentration:  Good  Recall:  Good  Fund of Knowledge:Good  Language: Good  Akathisia:  Negative  Handed:    AIMS (if indicated):     Assets:  Communication Skills Desire for Improvement Resilience Vocational/Educational  Sleep:  Number of Hours: 6.25  Cognition: WNL  ADL's:  Intact   Clinical Assessment::   56 yo AAF, single, homeless. Background history Depression and substance use. Self presented for voluntary admission. Recently got out of [facility]. Was having thoughts of relapsing and thoughts of suicide. UDS and BAL were negative at admission.  Seen today. She is looking forward to sober living. No craving for substances. Reports that he is in good spirits. Not feeling depressed. Reports normal energy and interest. Has been maintaining normal biological functions. She is able to think clearly. She  is able to focus on task. Her thoughts are not crowded or racing. No evidence of mania. No hallucination in any modality. She is not making any delusional statement. No passivity of will/thought. She is fully in touch with reality. No thoughts of suicide. No thoughts of homicide. No violent thoughts. No overwhelming anxiety.  Nursing staff reports that patient has been appropriate on the unit. Patient has been interacting well with peers.  No behavioral issues. Patient has not voiced any suicidal thoughts. Patient has not been observed to be internally stimulated. Patient has  been adherent with treatment recommendations. Patient has been tolerating their medication well.   Patient was discussed at team. Team members feels that patient is back to her baseline level of function. Team agrees with plan to discharge patient today.   Demographic Factors:  Living alone  Loss Factors: NA  Historical Factors: Impulsivity  Risk Reduction Factors:   Religious beliefs about death, Living with another person, especially a relative, Positive social support, Positive therapeutic relationship and Positive coping skills or problem solving skills  Continued Clinical Symptoms:   as above   Cognitive Features That Contribute To Risk:  None    Suicide Risk:  Minimal: No identifiable suicidal ideation.  Patient is not having any thoughts of suicide at this time. Modifiable risk factors targeted during this admission includes depression and substance use. Demographical and historical risk factors cannot be modified. Patient is now engaging well. Patient is reliable and is future oriented. We have buffered patient's support structures. At this point, patient is at low risk of suicide. Patient is aware of the effects of psychoactive substances on decision making process. Patient has been provided with emergency contacts. Patient acknowledges to use resources provided if unforseen circumstances changes their current risk stratification.    Follow-up Information    [facility] Follow up.   Specialty:  Behavioral Health Why:  Please go for a walk-in appointment within 1-3 days of discharge to be established for outpatient services. Walk-in hours are Mon-Fri 8am-3pm. Please arrive as early as possible to be sure that you are seen. Contact information: [place] [address] [phone]        [facility] Follow up.   Why:  Referral Faxed to Admissions Dept on 12/03/16. Messages left for Levada Dy in admissions to discuss  referral and check bed availability.  Contact information: [address], [place] [address] Phone: [phone] Fax: [phone]       [facility], [facility] Follow up on 12/10/2016.   Why:  Screening for possible admission on Monday at 7:45AM. Please bring Photo ID (provided by hospital) and medications/prescriptions (also provided). Thank you.  Contact information: [place] [address] [phone]           Plan Of Care/Follow-up recommendations:  1. Continue current psychotropic medications 2. Mental health and addiction follow up as arranged.  3. Provided limited quantity of prescriptions   [place], MD 12/09/2016, 5:48 PM

## 2016-12-09 NOTE — Progress Notes (Signed)
DAR NOTE: Patient presents with bright affect and good mood.  Denies pain, auditory and visual hallucinations.  Rates depression at 8, hopelessness at 7, and anxiety at 6.  Maintained on routine safety checks.  Medications given as prescribed.  Support and encouragement offered as needed.  Attended group and participated.  States goal for today is " to find a place."  Patient observed socializing with peers in the dayroom.  Offered no complaint.

## 2016-12-10 ENCOUNTER — Encounter: Payer: Self-pay | Admitting: *Deleted

## 2016-12-10 NOTE — Progress Notes (Signed)
D: Pt denies SI/HI/AVH. Pt is pleasant and cooperative. Pt stated she was ready for D/C in the morning.   A: Pt was offered support and encouragement. Pt was given scheduled medications. Pt was encourage to attend groups. Q 15 minute checks were done for safety.   R:Pt attends groups and interacts well with peers and staff. Pt is taking medication. Pt has no complaints.Pt receptive to treatment and safety maintained on unit.

## 2016-12-10 NOTE — Progress Notes (Signed)
Nurs Dischg Note:  D:Patient denies SI/HI/ AVH at this time. Pt appears calm and cooperative, and no distress noted.   A:Pt given discharge papers, and clothes out of the locker. Pt placed in lobby to wait for cab to take her to [facility]     R:  Pt States she will comply with outpatient services, and take MEDS as prescribed.

## 2017-02-11 ENCOUNTER — Ambulatory Visit: Payer: Self-pay

## 2017-02-14 ENCOUNTER — Ambulatory Visit (INDEPENDENT_AMBULATORY_CARE_PROVIDER_SITE_OTHER): Payer: Medicaid Other | Admitting: Internal Medicine

## 2017-02-14 VITALS — BP 122/64 | HR 64 | Temp 98.0°F | Wt 170.8 lb

## 2017-02-14 DIAGNOSIS — Z79899 Other long term (current) drug therapy: Secondary | ICD-10-CM | POA: Diagnosis not present

## 2017-02-14 DIAGNOSIS — F17210 Nicotine dependence, cigarettes, uncomplicated: Secondary | ICD-10-CM

## 2017-02-14 DIAGNOSIS — E890 Postprocedural hypothyroidism: Secondary | ICD-10-CM | POA: Diagnosis present

## 2017-02-14 MED ORDER — LEVOTHYROXINE SODIUM 75 MCG PO TABS: 75.0000 ug | ORAL_TABLET | Freq: Every day | ORAL | 4 refills | Status: DC

## 2017-02-14 NOTE — Assessment & Plan Note (Addendum)
Patient presenting for hypothyroidism follow-up. Requesting medication refill for levothyroxine. Denies any chest pain, shortness of breath,  nausea or vomiting, and changes in her bowel movements. Last TSH 2.3 on 01/2015.  - We will recheck TSH today. I will call patient if results are abnormal. - Levothyroxine 75 mcg refilled today. - Advised patient to follow-up with me (PCP) in 3-4 months for regular checkup  TSH 3.1

## 2017-02-14 NOTE — Patient Instructions (Addendum)
It was nice to meet you today, [name].   You received a printed prescription for your levothyroxine. Please continue to take 1 tablet once a day.   I will call you if results from your blood test are normal.  Please make a follow-up appointment with me (Dr. Frederico Hamman) in 3-4 months for a regular checkup.  Please call [facility] clinic if you have questions or concerns.

## 2017-02-15 LAB — TSH: TSH: 3.14 u[IU]/mL (ref 0.450–4.500)

## 2017-02-15 NOTE — Progress Notes (Signed)
   CC: Hypothyroidism follow up   HPI:  [name] is a 56 y.o. female with PMH as described below how presents to clinic for hypothyroidism follow-up. Please see problem based assessment and plan for further details.   Past Medical History:  Diagnosis Date  . Cocaine abuse   . ETOH abuse   . Hypothyroidism   . Unspecified mood (affective) disorder ([place])    Review of Systems:   Review of Systems  Constitutional: Negative for chills, diaphoresis, fever and weight loss.  Respiratory: Negative for cough and shortness of breath.   Cardiovascular: Negative for chest pain, palpitations and leg swelling.  Gastrointestinal: Negative for abdominal pain, constipation, diarrhea, nausea and vomiting.  Musculoskeletal: Negative for myalgias.  Psychiatric/Behavioral: Negative for depression.     Physical Exam:  Vitals:   02/14/17 1016  BP: 122/64  Pulse: 64  Temp: 98 F (36.7 C)  TempSrc: Oral  SpO2: 100%  Weight: 170 lb 12.8 oz (77.5 kg)   General: pleasant female, well-developed, in no acute distress  HENT: NCAT, neck supple and FROM, OP clear without exudates or erythema  Eyes: anicteric sclera, PERRL Cardiac: regular rate and rhythm, nl S1/S2, no murmurs, rubs or gallops  Pulm: CTAB, no wheezes or crackles, no increased work of breathing  Abd: soft, NTND, bowel sounds present  Neuro: A&Ox3, no focal deficits noted  Ext: warm and well perfused, no peripheral edema    Assessment & Plan:   See Encounters Tab for problem based charting.  Patient seen with Dr. Daryll Drown

## 2017-02-20 NOTE — Progress Notes (Signed)
Internal Medicine Clinic Attending  I saw and evaluated the patient.  I personally confirmed the key portions of the history and exam documented by Dr. Santos-Sanchez and I reviewed pertinent patient test results.  The assessment, diagnosis, and plan were formulated together and I agree with the documentation in the resident's note. 

## 2017-06-26 ENCOUNTER — Other Ambulatory Visit: Payer: Self-pay

## 2017-06-26 ENCOUNTER — Encounter (HOSPITAL_COMMUNITY): Payer: Self-pay | Admitting: Emergency Medicine

## 2017-06-26 ENCOUNTER — Emergency Department (HOSPITAL_COMMUNITY): Payer: Medicaid Other

## 2017-06-26 ENCOUNTER — Emergency Department (HOSPITAL_COMMUNITY)
Admission: EM | Admit: 2017-06-26 | Discharge: 2017-06-26 | Disposition: A | Discharge status: Home / Self Care | Payer: Medicaid Other | Attending: Emergency Medicine | Admitting: Emergency Medicine

## 2017-06-26 DIAGNOSIS — Z79899 Other long term (current) drug therapy: Secondary | ICD-10-CM | POA: Diagnosis not present

## 2017-06-26 DIAGNOSIS — M1712 Unilateral primary osteoarthritis, left knee: Secondary | ICD-10-CM | POA: Insufficient documentation

## 2017-06-26 DIAGNOSIS — E039 Hypothyroidism, unspecified: Secondary | ICD-10-CM | POA: Insufficient documentation

## 2017-06-26 DIAGNOSIS — M25562 Pain in left knee: Secondary | ICD-10-CM | POA: Diagnosis present

## 2017-06-26 DIAGNOSIS — F17210 Nicotine dependence, cigarettes, uncomplicated: Secondary | ICD-10-CM | POA: Diagnosis not present

## 2017-06-26 DIAGNOSIS — I10 Essential (primary) hypertension: Secondary | ICD-10-CM | POA: Insufficient documentation

## 2017-06-26 MED ORDER — IBUPROFEN 400 MG PO TABS
600.0000 mg | ORAL_TABLET | Freq: Once | ORAL | Status: AC
  Administered 2017-06-26: 600 mg via ORAL
  Filled 2017-06-26: qty 1

## 2017-06-26 MED ORDER — IBUPROFEN 600 MG PO TABS: 600.0000 mg | ORAL_TABLET | Freq: Four times a day (QID) | ORAL | 0 refills | Status: DC | PRN

## 2017-06-26 NOTE — ED Triage Notes (Signed)
Pt reports left knee pain worse with walking and when she wakes up in the morning. No injury

## 2017-06-26 NOTE — Discharge Instructions (Signed)
You have arthritis of the left knee that is causing you pain. Take ibuprofen, tylenol for pain. Ice at rest and keep elevated at rest. You are given contact information to follow-up with orthopedic surgeon that may do knee injections and other management regarding your arthritis.  Return for worsening symptoms, including redness overlying knee, fever, or any other symptoms concerning to you

## 2017-06-26 NOTE — ED Provider Notes (Signed)
[facility] EMERGENCY DEPARTMENT Provider Note   CSN: [id number] Arrival date & time: 06/26/17  0746     History   Chief Complaint Chief Complaint  Patient presents with  . Leg Pain    HPI [name] is a 56 y.o. female.  HPI 56 year old female who presents with left knee pain.  She has previous history of alcohol abuse and cocaine abuse.  States that she has had progressively worsening left knee pain ongoing for several years.  Symptoms typically worse in the morning, worse with walking and movement.  Has had mild swelling of the knee.  No discoloration or redness.  No fevers, chills, nausea or vomiting.  No new focal numbness or weakness.  No known prior injury.  Has not taken any medications for her symptoms. Past Medical History:  Diagnosis Date  . Cocaine abuse ([place])   . ETOH abuse   . Hypothyroidism   . Unspecified mood (affective) disorder [facility])     Patient Active Problem List   Diagnosis Date Noted  . MDD (major depressive disorder), recurrent severe, without psychosis ([place]) 11/03/2016  . Alcohol-induced mood disorder with depressive symptoms ([place]) 10/26/2016  . Poor dentition 06/26/2016  . Essential hypertension 06/26/2016  . Hyperlipidemia 06/26/2016  . Elevated transaminase level 08/29/2015  . Health care maintenance 12/31/2013  . Polysubstance abuse (h/o cocaine, ETOH, and tobacco abuse)    . Primary vulvar squamous cell carcinoma s/p resection 2011 02/13/2010  . 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 TOBACCO ABUSE 04/12/2006    Past Surgical History:  Procedure Laterality Date  . radioactive iodine ablation     graves disease s/p    OB History    Gravida Para Term Preterm AB Living   1 1 1     1    SAB TAB Ectopic Multiple Live Births                   Home Medications    Prior to Admission medications   Medication Sig Start Date End Date Taking? Authorizing Provider  citalopram (CELEXA) 40 MG tablet Take 1 tablet  (40 mg total) by mouth daily. For depression 12/05/16   Lindell Spar I, NP  hydrOXYzine (ATARAX/VISTARIL) 25 MG tablet Take 1 tablet (25 mg total) by mouth every 6 (six) hours as needed for anxiety. 12/05/16   Lindell Spar I, NP  ibuprofen (ADVIL,MOTRIN) 600 MG tablet Take 1 tablet (600 mg total) by mouth every 6 (six) hours as needed for mild pain or moderate pain. 06/26/17   Forde Dandy, MD  levothyroxine (SYNTHROID, LEVOTHROID) 75 MCG tablet Take 1 tablet (75 mcg total) by mouth daily at 6 (six) AM. For low functioning thyroid 02/14/17   Welford Roche, MD  nicotine (NICODERM CQ - DOSED IN MG/24 HOURS) 21 mg/24hr patch Place 1 patch (21 mg total) onto the skin daily. For smoking cessation 12/05/16   Lindell Spar I, NP  risperiDONE (RISPERDAL) 2 MG tablet Take 1 tablet (2 mg total) by mouth at bedtime. For mood control 12/05/16   Lindell Spar I, NP  traZODone (DESYREL) 100 MG tablet Take 2 tablets (200 mg total) by mouth at bedtime. For sleep 12/05/16   Lindell Spar I, NP    Family History No family history on file.  Social History Social History   Tobacco Use  . Smoking status: Current Every Day Smoker    Packs/day: 0.30    Types: Cigarettes  . Smokeless tobacco: Never Used  Substance Use Topics  .  Alcohol use: No    Alcohol/week: 0.0 oz    Comment: Last drink: In the last month.   . Drug use: No    Comment: Used Crack. Last used: Earlier in the month.      Allergies   Patient has no known allergies.   Review of Systems Review of Systems  Constitutional: Negative for fever.  Musculoskeletal: Positive for joint swelling.  Skin: Negative for wound.  Allergic/Immunologic: Negative for immunocompromised state.  Neurological: Positive for numbness. Negative for weakness.  Hematological: Does not bruise/bleed easily.     Physical Exam Updated Vital Signs BP (!) 143/78 (BP Location: Right Arm)   Pulse 90   Temp 98 F (36.7 C) (Oral)   Resp 16   SpO2 99%   Physical  Exam Physical Exam  Constitutional: Appears well-developed and well-nourished. No acute distress. HENT:  Head: Normocephalic.  Eyes: Conjunctivae are normal.  Neck: Supple, normal range of motion Cardiovascular: Normal rate and intact distal pulses.  +2 DP pulses bilaterally Pulmonary/Chest: Effort normal. No respiratory distress.  Abdominal: Exhibits no distension.  Musculoskeletal: Normal range of motion of bilateral knees. Exhibits no deformity.  There is mild soft tissue swelling of the left knee without overlying erythema, induration or warmth. Neurological: Alert. Fluent speech.  Full strength and sensation in bilateral lower extremities Skin: Skin is warm and dry.  Psychiatric: Normal mood and affect. Behavior is normal.  Nursing note and vitals reviewed.   ED Treatments / Results  Labs (all labs ordered are listed, but only abnormal results are displayed) Labs Reviewed - No data to display  EKG  EKG Interpretation None       Radiology Dg Knee Complete 4 Views Left  Result Date: 06/26/2017 CLINICAL DATA:  Chronic pain EXAM: LEFT KNEE - COMPLETE 4+ VIEW COMPARISON:  None. FINDINGS: Frontal, lateral, and bilateral oblique views were obtained. There is no fracture or dislocation. There is a moderate joint effusion. There is narrowing medially and in the patellofemoral joint. There is spurring in all compartments. There is mild chondrocalcinosis. No erosive change. IMPRESSION: Osteoarthritic change, most marked medially and in the patellofemoral joint. Moderate joint effusion. There is chondrocalcinosis, a finding that may be seen with osteoarthritis or with calcium pyrophosphate deposition disease. No fracture or dislocation. Electronically Signed   By: Lowella Grip III M.D.   On: 06/26/2017 08:57    Procedures Procedures (including critical care time)  Medications Ordered in ED Medications  ibuprofen (ADVIL,MOTRIN) tablet 600 mg (not administered)     Initial  Impression / Assessment and Plan / ED Course  I have reviewed the triage vital signs and the nursing notes.  Pertinent labs & imaging results that were available during my care of the patient were reviewed by me and considered in my medical decision making (see chart for details).     Presents with chronic left knee pain. Mild swelling noted, but no redness or signs of infection. Normal range of motion and has been ambulatory. Afebrile, well appearing.   Suspect arthritis. X-ray visualized c/w arthritis, possible pseudogout. Will start on NSAIDs and other supportive care measures. Follow-up with orthopedic surgery also provided. Strict return and follow-up instructions reviewed. She expressed understanding of all discharge instructions and felt comfortable with the plan of care.   Final Clinical Impressions(s) / ED Diagnoses   Final diagnoses:  Primary osteoarthritis of left knee    ED Discharge Orders        Ordered    ibuprofen (ADVIL,MOTRIN) 600 MG  tablet  Every 6 hours PRN     06/26/17 0922       Forde Dandy, MD 06/26/17 [phone]

## 2017-09-06 ENCOUNTER — Encounter: Payer: Self-pay | Admitting: Internal Medicine

## 2017-09-12 ENCOUNTER — Ambulatory Visit (INDEPENDENT_AMBULATORY_CARE_PROVIDER_SITE_OTHER): Payer: Medicaid Other | Admitting: Internal Medicine

## 2017-09-12 ENCOUNTER — Encounter: Payer: Self-pay | Admitting: Internal Medicine

## 2017-09-12 ENCOUNTER — Other Ambulatory Visit: Payer: Self-pay

## 2017-09-12 VITALS — BP 143/72 | HR 91 | Temp 97.6°F | Ht 61.0 in | Wt 186.6 lb

## 2017-09-12 DIAGNOSIS — Z79890 Hormone replacement therapy (postmenopausal): Secondary | ICD-10-CM | POA: Diagnosis not present

## 2017-09-12 DIAGNOSIS — Z791 Long term (current) use of non-steroidal anti-inflammatories (NSAID): Secondary | ICD-10-CM

## 2017-09-12 DIAGNOSIS — E890 Postprocedural hypothyroidism: Secondary | ICD-10-CM

## 2017-09-12 DIAGNOSIS — M1712 Unilateral primary osteoarthritis, left knee: Secondary | ICD-10-CM | POA: Insufficient documentation

## 2017-09-12 MED ORDER — DICLOFENAC SODIUM 1 % TD GEL: 2.0000 g | Freq: Four times a day (QID) | TRANSDERMAL | 5 refills | Status: DC | PRN

## 2017-09-12 MED ORDER — LEVOTHYROXINE SODIUM 75 MCG PO TABS: 75.0000 ug | ORAL_TABLET | Freq: Every day | ORAL | 4 refills | Status: DC

## 2017-09-12 NOTE — Progress Notes (Signed)
   CC: Postabliative hypothyroidism, Knee Pain  HPI:  [name] is a 57 y.o. F with PMHx listed below presenting for Postabliative hypothyroidism, Knee Pain. Please see the A&P for the status of the patient's chronic medical problems.  Patient complains of long term knee pain that has been worse in the last several months. The pain is described as an intermittent 7/10 achy pain; worse with activity and associated with morning stiffness. She went to the ED on 06/26/17 ad Xray of the kneee at that time showed Osteoarthritis. She was prescribed NSAIDs and given an orthopedic referral (which she states she did not get). She states she has only been taking ibuprofen as needed for her pain, with only partial relief.   Past Medical History:  Diagnosis Date  . Cocaine abuse ([place])   . ETOH abuse   . Hypothyroidism   . Unspecified mood (affective) disorder ([place])    Review of Systems: Performed and all others negative.  Physical Exam:  Vitals:   09/12/17 0923  BP: (!) 143/72  Pulse: 91  Temp: 97.6 F (36.4 C)  TempSrc: Oral  SpO2: 96%  Weight: 186 lb 9.6 oz (84.6 kg)  Height: 5\' 1"  (1.549 m)   Physical Exam  Constitutional: She is oriented to person, place, and time. She appears well-developed and well-nourished. No distress.  Cardiovascular: Normal rate, regular rhythm, normal heart sounds and intact distal pulses.  Pulmonary/Chest: Effort normal and breath sounds normal. No respiratory distress.  Abdominal: Soft. Bowel sounds are normal. She exhibits no distension. There is no tenderness.  Musculoskeletal: She exhibits no edema or deformity.  Good range of motion of left knee No effusion or erythema noted  Neurological: She is alert and oriented to person, place, and time.  Skin: Skin is warm and dry.   Assessment & Plan:   See Encounters Tab for problem based charting.  Patient seen with Dr. Rebeca Alert

## 2017-09-12 NOTE — Patient Instructions (Addendum)
Thank you for allowing [place] to care for you  For your knee pain - You have been prescribed Voltaren gel to use as needed for pain  You were provided a refill of Synthroid today  Follow up with PCP in about 2 months

## 2017-09-12 NOTE — Assessment & Plan Note (Signed)
Patient in need of refill of her synthroid at this time. She denies any symptoms of hypothyroidism and her last TSH on 02/15/17 was normal at 3.1. - Synthroid 55mcg Daily

## 2017-09-12 NOTE — Progress Notes (Signed)
Internal Medicine Clinic Attending  Case discussed with Dr. Melvin  at the time of the visit.  We reviewed the resident's history and exam and pertinent patient test results.  I agree with the assessment, diagnosis, and plan of care documented in the resident's note.  Alexander N Raines, MD   

## 2017-09-12 NOTE — Assessment & Plan Note (Signed)
Patient complains of long term knee pain that has been worse in the last several months. The pain is described as an intermittent 7/10 achy pain; worse with activity and associated with morning stiffness. She went to the ED on 06/26/17 ad Xray of the kneee at that time showed Osteoarthritis. She was prescribed NSAIDs and given an orthopedic referral (which she states she did not get). She states she has only been taking ibuprofen as needed for her pain, with only partial relief. She has also been using a knee brace, which she feels is helping.  Full range of motion on exam today. No significant effusion palpated. No erythema. Will provide patient with prescription for topical NSAIDs for arthritis as pain is restricted to single joint. - Voltaren Gel 2g QID PRN - Continue knee brace

## 2018-02-11 IMAGING — DX DG KNEE COMPLETE 4+V*L*
4 series · 4 of 4 positions shown · non-contrast
Comparison: None.

CLINICAL DATA: Chronic pain

EXAM:
LEFT KNEE - COMPLETE 4+ VIEW

[knee ap]
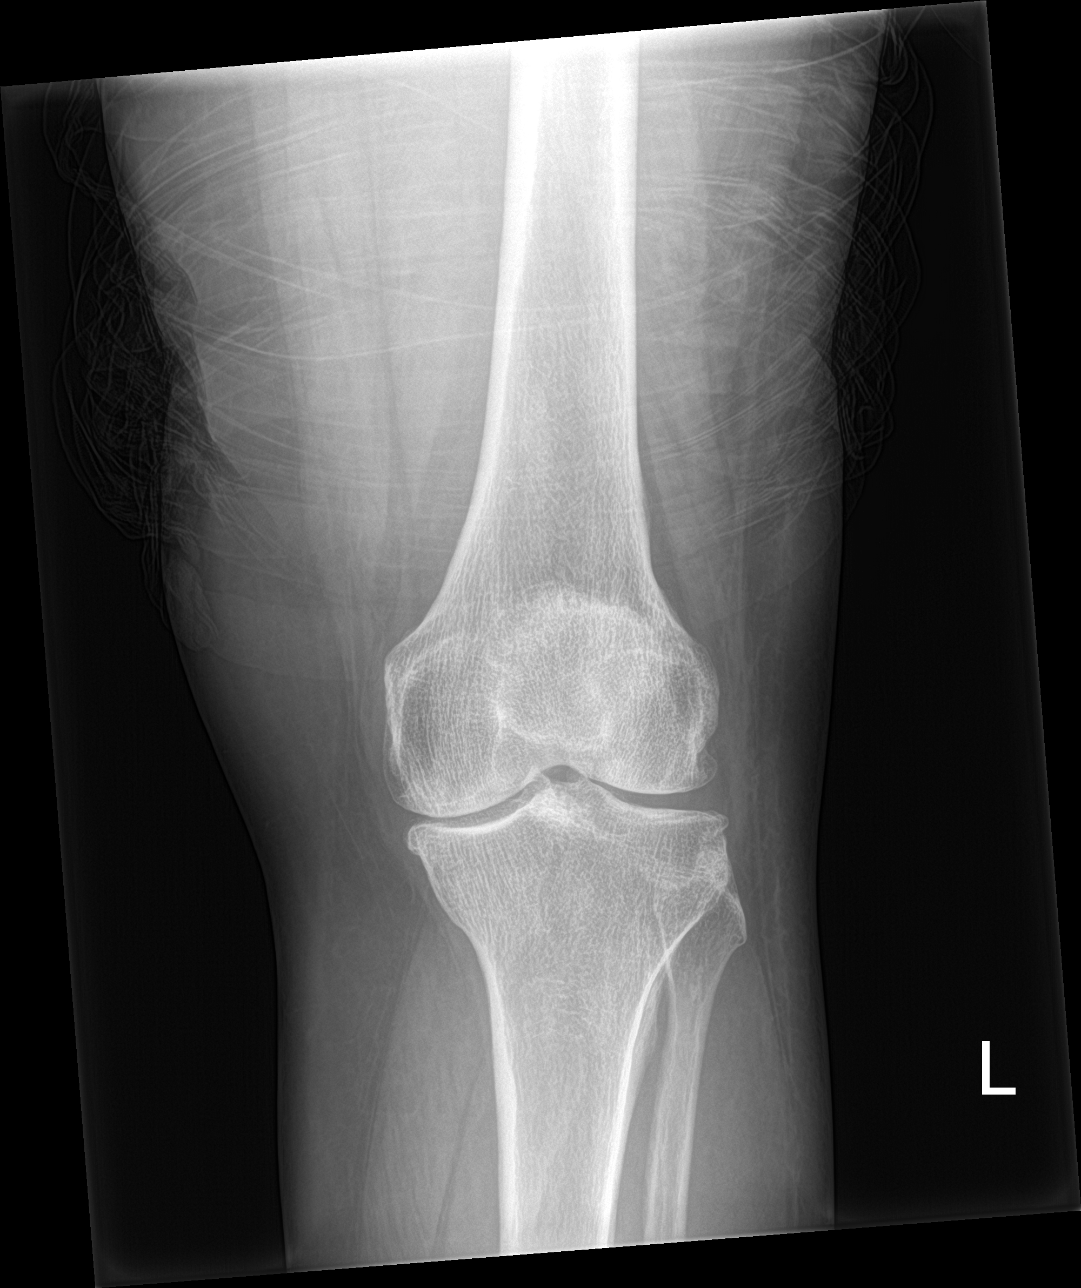

[knee lat]
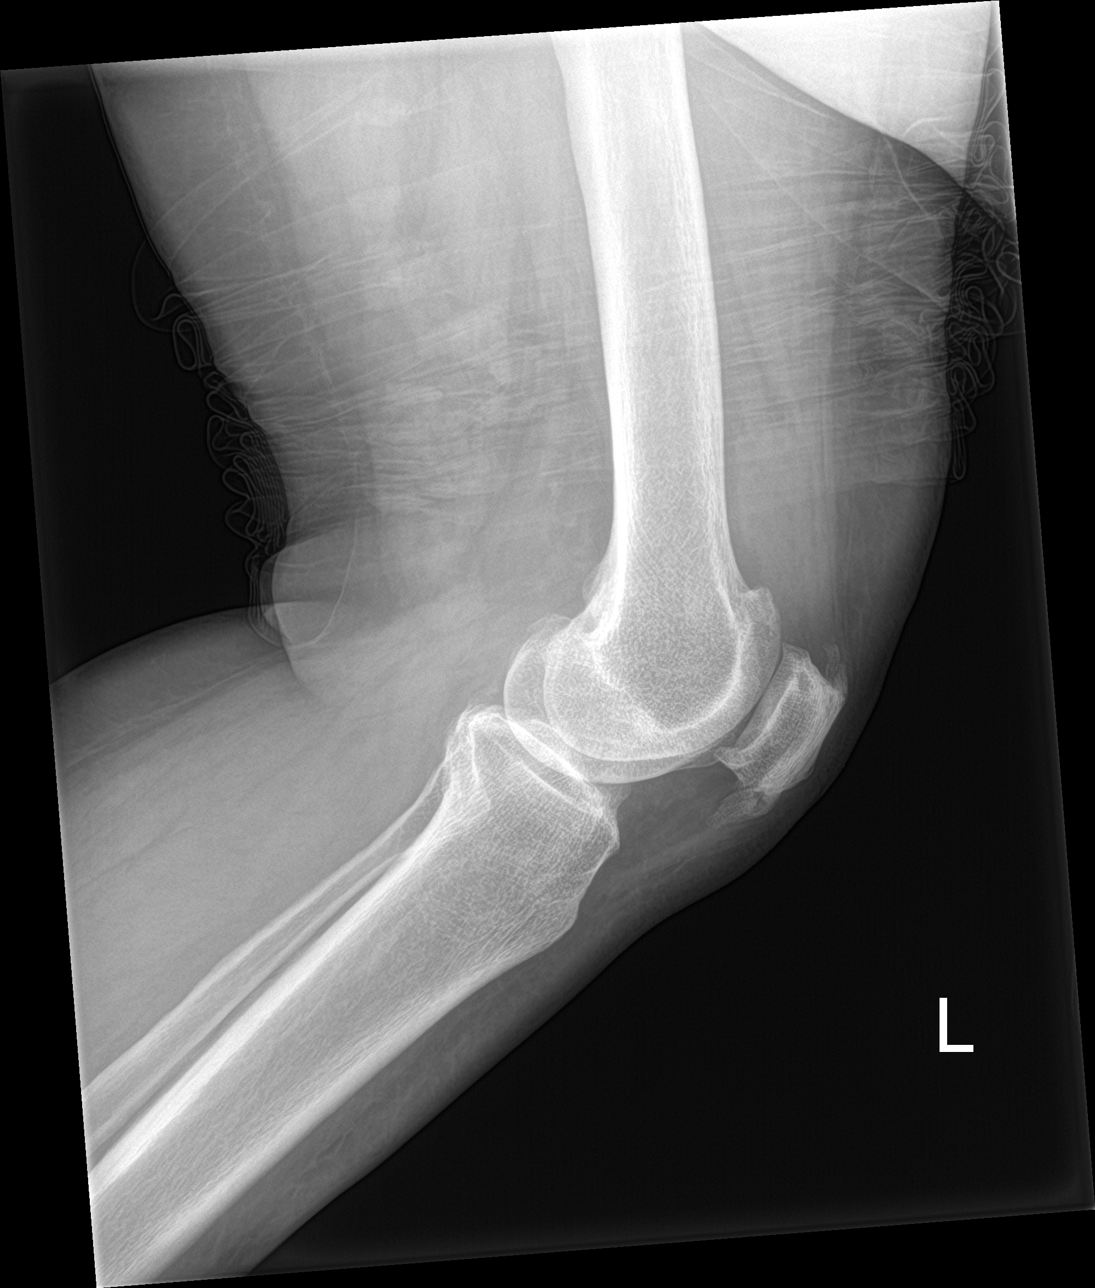

[knee obl (1 of 2)]
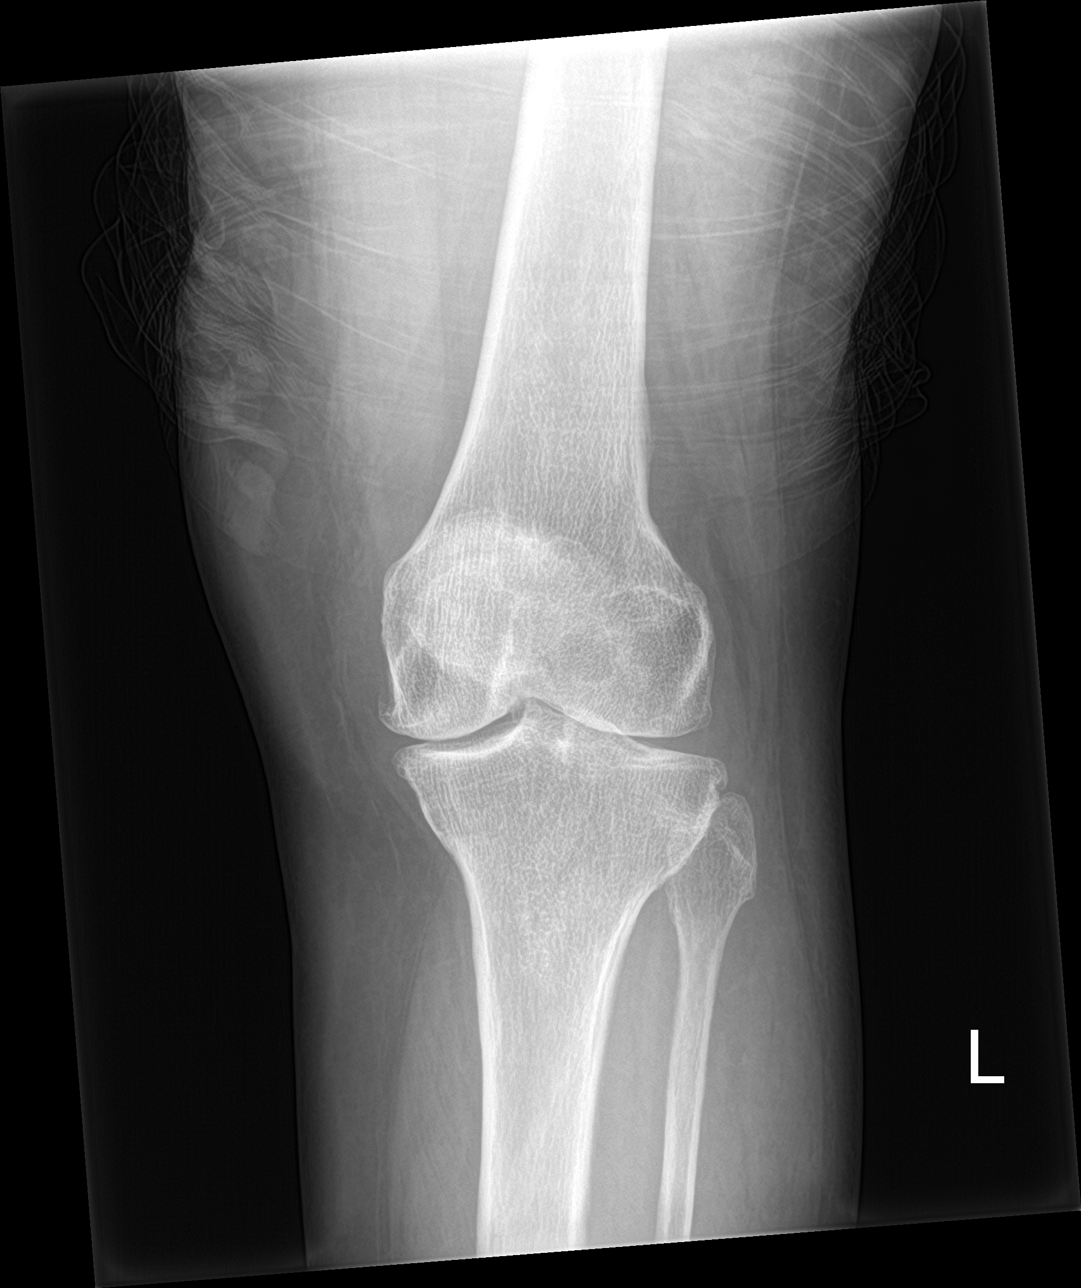

[knee obl (2 of 2)]
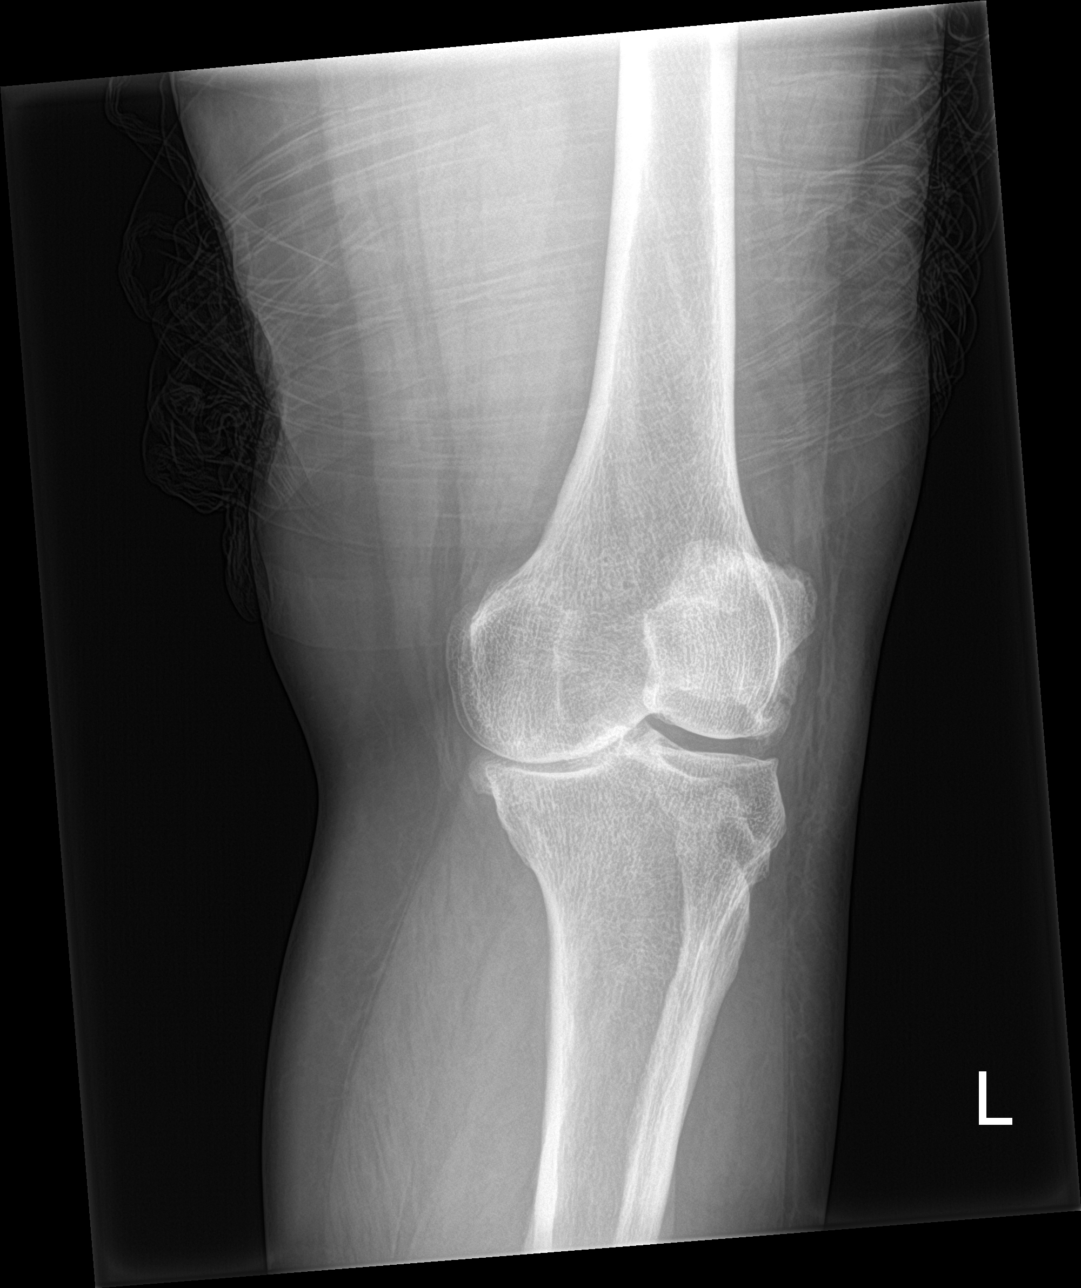

[4 of 4 positions shown; findings below may reference images not displayed]

FINDINGS: Frontal, lateral, and bilateral oblique views were obtained. There
is no fracture or dislocation. There is a moderate joint effusion.
There is narrowing medially and in the patellofemoral joint. There
is spurring in all compartments. There is mild chondrocalcinosis. No
erosive change.
IMPRESSION: Osteoarthritic change, most marked medially and in the
patellofemoral joint. Moderate joint effusion. There is
chondrocalcinosis, a finding that may be seen with osteoarthritis or
with calcium pyrophosphate deposition disease. No fracture or
dislocation.

## 2018-04-17 ENCOUNTER — Other Ambulatory Visit: Payer: Self-pay | Admitting: Orthopedic Surgery

## 2018-04-25 NOTE — Patient Instructions (Signed)
Latrish L Ober  04/25/2018   Your procedure is scheduled on: Friday 05/09/2018  Report to [facility] Main  Entrance              Report to admitting at   0530 AM    Call this number if you have problems the morning of surgery [phone]    Remember: Do not eat food or drink liquids :After Midnight.              BRUSH YOUR TEETH MORNING OF SURGERY AND RINSE YOUR MOUTH OUT, NO CHEWING GUM CANDY OR MINTS.     Take these medicines the morning of surgery with A SIP OF WATER: Levothyroxine (Synthroid)                                     You may not have any metal on your body including hair pins and              piercings  Do not wear jewelry, make-up, lotions, powders or perfumes, deodorant             Do not wear nail polish.  Do not shave  48 hours prior to surgery.          Do not bring valuables to the hospital. [place].  Contacts, dentures or bridgework may not be worn into surgery.  Leave suitcase in the car. After surgery it may be brought to your room.                  Please read over the following fact sheets you were given: _____________________________________________________________________             [facility] - Preparing for Surgery Before surgery, you can play an important role.  Because skin is not sterile, your skin needs to be as free of germs as possible.  You can reduce the number of germs on your skin by washing with CHG (chlorahexidine gluconate) soap before surgery.  CHG is an antiseptic cleaner which kills germs and bonds with the skin to continue killing germs even after washing. Please DO NOT use if you have an allergy to CHG or antibacterial soaps.  If your skin becomes reddened/irritated stop using the CHG and inform your nurse when you arrive at Short Stay. Do not shave (including legs and underarms) for at least 48 hours prior to the first CHG shower.  You may shave  your face/neck. Please follow these instructions carefully:  1.  Shower with CHG Soap the night before surgery and the  morning of Surgery.  2.  If you choose to wash your hair, wash your hair first as usual with your  normal  shampoo.  3.  After you shampoo, rinse your hair and body thoroughly to remove the  shampoo.                           4.  Use CHG as you would any other liquid soap.  You can apply chg directly  to the skin and wash  Gently with a scrungie or clean washcloth.  5.  Apply the CHG Soap to your body ONLY FROM THE NECK DOWN.   Do not use on face/ open                           Wound or open sores. Avoid contact with eyes, ears mouth and genitals (private parts).                       Wash face,  Genitals (private parts) with your normal soap.             6.  Wash thoroughly, paying special attention to the area where your surgery  will be performed.  7.  Thoroughly rinse your body with warm water from the neck down.  8.  DO NOT shower/wash with your normal soap after using and rinsing off  the CHG Soap.                9.  Pat yourself dry with a clean towel.            10.  Wear clean pajamas.            11.  Place clean sheets on your bed the night of your first shower and do not  sleep with pets. Day of Surgery : Do not apply any lotions/deodorants the morning of surgery.  Please wear clean clothes to the hospital/surgery center.  FAILURE TO FOLLOW THESE INSTRUCTIONS MAY RESULT IN THE CANCELLATION OF YOUR SURGERY PATIENT SIGNATURE_________________________________  NURSE SIGNATURE__________________________________  ________________________________________________________________________   Adam Phenix  An incentive spirometer is a tool that can help keep your lungs clear and active. This tool measures how well you are filling your lungs with each breath. Taking long deep breaths may help reverse or decrease the chance of developing  breathing (pulmonary) problems (especially infection) following:  A long period of time when you are unable to move or be active. BEFORE THE PROCEDURE   If the spirometer includes an indicator to show your best effort, your nurse or respiratory therapist will set it to a desired goal.  If possible, sit up straight or lean slightly forward. Try not to slouch.  Hold the incentive spirometer in an upright position. INSTRUCTIONS FOR USE  1. Sit on the edge of your bed if possible, or sit up as far as you can in bed or on a chair. 2. Hold the incentive spirometer in an upright position. 3. Breathe out normally. 4. Place the mouthpiece in your mouth and seal your lips tightly around it. 5. Breathe in slowly and as deeply as possible, raising the piston or the ball toward the top of the column. 6. Hold your breath for 3-5 seconds or for as long as possible. Allow the piston or ball to fall to the bottom of the column. 7. Remove the mouthpiece from your mouth and breathe out normally. 8. Rest for a few seconds and repeat Steps 1 through 7 at least 10 times every 1-2 hours when you are awake. Take your time and take a few normal breaths between deep breaths. 9. The spirometer may include an indicator to show your best effort. Use the indicator as a goal to work toward during each repetition. 10. After each set of 10 deep breaths, practice coughing to be sure your lungs are clear. If you have an incision (the cut made at the time of surgery),  support your incision when coughing by placing a pillow or rolled up towels firmly against it. Once you are able to get out of bed, walk around indoors and cough well. You may stop using the incentive spirometer when instructed by your caregiver.  RISKS AND COMPLICATIONS  Take your time so you do not get dizzy or light-headed.  If you are in pain, you may need to take or ask for pain medication before doing incentive spirometry. It is harder to take a deep  breath if you are having pain. AFTER USE  Rest and breathe slowly and easily.  It can be helpful to keep track of a log of your progress. Your caregiver can provide you with a simple table to help with this. If you are using the spirometer at home, follow these instructions: [place] IF:   You are having difficultly using the spirometer.  You have trouble using the spirometer as often as instructed.  Your pain medication is not giving enough relief while using the spirometer.  You develop fever of 100.5 F (38.1 C) or higher. SEEK IMMEDIATE MEDICAL CARE IF:   You cough up bloody sputum that had not been present before.  You develop fever of 102 F (38.9 C) or greater.  You develop worsening pain at or near the incision site. MAKE SURE YOU:   Understand these instructions.  Will watch your condition.  Will get help right away if you are not doing well or get worse. Document Released: 10/29/2006 Document Revised: 09/10/2011 Document Reviewed: 12/30/2006 [facility] Patient Information 2014 [place], [place].   ________________________________________________________________________

## 2018-04-28 ENCOUNTER — Other Ambulatory Visit: Payer: Self-pay

## 2018-04-28 ENCOUNTER — Ambulatory Visit (HOSPITAL_COMMUNITY)
Admission: RE | Admit: 2018-04-28 | Discharge: 2018-04-28 | Disposition: A | Discharge status: Home / Self Care | Payer: Medicaid Other | Source: Ambulatory Visit | Attending: Orthopedic Surgery | Admitting: Orthopedic Surgery

## 2018-04-28 ENCOUNTER — Encounter (HOSPITAL_COMMUNITY): Payer: Self-pay

## 2018-04-28 ENCOUNTER — Encounter (HOSPITAL_COMMUNITY)
Admission: RE | Admit: 2018-04-28 | Discharge: 2018-04-28 | Disposition: A | Discharge status: Home / Self Care | Payer: Medicaid Other | Source: Ambulatory Visit | Attending: Orthopedic Surgery | Admitting: Orthopedic Surgery

## 2018-04-28 DIAGNOSIS — Z01818 Encounter for other preprocedural examination: Secondary | ICD-10-CM | POA: Insufficient documentation

## 2018-04-28 LAB — SURGICAL PCR SCREEN
MRSA, PCR: NEGATIVE
Staphylococcus aureus: POSITIVE — AB

## 2018-04-28 LAB — CBC WITH DIFFERENTIAL/PLATELET
Abs Immature Granulocytes: 0.01 10*3/uL (ref 0.00–0.07)
BASOS ABS: 0 10*3/uL (ref 0.0–0.1)
Basophils Relative: 0 %
Eosinophils Absolute: 0.1 10*3/uL (ref 0.0–0.5)
Eosinophils Relative: 2 %
HEMATOCRIT: 41 % (ref 36.0–46.0)
HEMOGLOBIN: 12.8 g/dL (ref 12.0–15.0)
IMMATURE GRANULOCYTES: 0 %
LYMPHS ABS: 2.2 10*3/uL (ref 0.7–4.0)
LYMPHS PCT: 32 %
MCH: 27.8 pg (ref 26.0–34.0)
MCHC: 31.2 g/dL (ref 30.0–36.0)
MCV: 88.9 fL (ref 80.0–100.0)
Monocytes Absolute: 0.3 10*3/uL (ref 0.1–1.0)
Monocytes Relative: 5 %
NRBC: 0 % (ref 0.0–0.2)
Neutro Abs: 4.2 10*3/uL (ref 1.7–7.7)
Neutrophils Relative %: 61 %
Platelets: 305 10*3/uL (ref 150–400)
RBC: 4.61 MIL/uL (ref 3.87–5.11)
RDW: 13.4 % (ref 11.5–15.5)
WBC: 6.9 10*3/uL (ref 4.0–10.5)

## 2018-04-28 LAB — TYPE AND SCREEN
ABO/RH(D): AB POS
Antibody Screen: NEGATIVE

## 2018-04-28 LAB — COMPREHENSIVE METABOLIC PANEL
ALBUMIN: 4 g/dL (ref 3.5–5.0)
ALK PHOS: 72 U/L (ref 38–126)
ALT: 70 U/L — ABNORMAL HIGH (ref 0–44)
ANION GAP: 6 (ref 5–15)
AST: 67 U/L — AB (ref 15–41)
BILIRUBIN TOTAL: 0.8 mg/dL (ref 0.3–1.2)
BUN: 11 mg/dL (ref 6–20)
CALCIUM: 8.7 mg/dL — AB (ref 8.9–10.3)
CO2: 30 mmol/L (ref 22–32)
CREATININE: 0.75 mg/dL (ref 0.44–1.00)
Chloride: 104 mmol/L (ref 98–111)
GFR calc Af Amer: >60 mL/min (ref >60)
GFR calc non Af Amer: >60 mL/min (ref >60)
GLUCOSE: 108 mg/dL — AB (ref 70–99)
Potassium: 3.2 mmol/L — ABNORMAL LOW (ref 3.5–5.1)
SODIUM: 140 mmol/L (ref 135–145)
TOTAL PROTEIN: 7.1 g/dL (ref 6.5–8.1)

## 2018-04-28 LAB — URINALYSIS, ROUTINE W REFLEX MICROSCOPIC
BILIRUBIN URINE: NEGATIVE
Glucose, UA: NEGATIVE mg/dL
Ketones, ur: NEGATIVE mg/dL
Nitrite: NEGATIVE
PROTEIN: NEGATIVE mg/dL
SPECIFIC GRAVITY, URINE: 1.023 (ref 1.005–1.030)
pH: 6 (ref 5.0–8.0)

## 2018-04-28 LAB — APTT: aPTT: 30 seconds (ref 24–36)

## 2018-04-28 LAB — RAPID URINE DRUG SCREEN, HOSP PERFORMED
Amphetamines: NOT DETECTED
BARBITURATES: NOT DETECTED
BENZODIAZEPINES: NOT DETECTED
COCAINE: NOT DETECTED
Opiates: NOT DETECTED
TETRAHYDROCANNABINOL: NOT DETECTED

## 2018-04-28 LAB — ABO/RH: ABO/RH(D): AB POS

## 2018-04-28 LAB — PROTIME-INR
INR: 1.01
PROTHROMBIN TIME: 13.2 s (ref 11.4–15.2)

## 2018-05-08 MED ORDER — BUPIVACAINE LIPOSOME 1.3 % IJ SUSP
20.0000 mL | Freq: Once | INTRAMUSCULAR | Status: DC
  Filled 2018-05-08: qty 20

## 2018-05-08 NOTE — H&P (Signed)
TOTAL KNEE ADMISSION H&P  Patient is being admitted for right total knee arthroplasty.  Subjective:  Chief Complaint:right knee pain.  HPI: [name], 57 y.o. female, has a history of pain and functional disability in the right knee due to arthritis and has failed non-surgical conservative treatments for greater than 12 weeks to includeNSAID's and/or analgesics, corticosteriod injections, viscosupplementation injections, weight reduction as appropriate and activity modification.  Onset of symptoms was gradual, starting 6 years ago with gradually worsening course since that time. The patient noted no past surgery on the right knee(s).  Patient currently rates pain in the right knee(s) at 8 out of 10 with activity. Patient has night pain, worsening of pain with activity and weight bearing, pain that interferes with activities of daily living, pain with passive range of motion and joint swelling.  Patient has evidence of subchondral cysts, periarticular osteophytes and joint space narrowing by imaging studies. This patient has had ffailure of all reasonable conservative care. There is no active infection.  Patient Active Problem List   Diagnosis Date Noted  . Osteoarthritis of left knee 09/12/2017  . MDD (major depressive disorder), recurrent severe, without psychosis ([place]) 11/03/2016  . Alcohol-induced mood disorder with depressive symptoms ([place]) 10/26/2016  . Poor dentition 06/26/2016  . Essential hypertension 06/26/2016  . Hyperlipidemia 06/26/2016  . Elevated transaminase level 08/29/2015  . Health care maintenance 12/31/2013  . Polysubstance abuse (h/o cocaine, ETOH, and tobacco abuse)    . Primary vulvar squamous cell carcinoma s/p resection 2011 02/13/2010  . 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 TOBACCO ABUSE 04/12/2006   Past Medical History:  Diagnosis Date  . Cocaine abuse ([place])   . ETOH abuse   . Hypothyroidism   . Unspecified mood (affective) disorder [facility])      Past Surgical History:  Procedure Laterality Date  . radioactive iodine ablation     Tierney Behl disease s/p    Current Facility-Administered Medications  Medication Dose Route Frequency Provider Last Rate Last Dose  . [START ON 05/09/2018] bupivacaine liposome (EXPAREL) 1.3 % injection 266 mg  20 mL Other Once Dorna Leitz, MD       Current Outpatient Medications  Medication Sig Dispense Refill Last Dose  . ibuprofen (ADVIL,MOTRIN) 200 MG tablet Take 200 mg by mouth every 6 (six) hours as needed for moderate pain.     Marland Kitchen levothyroxine (SYNTHROID, LEVOTHROID) 75 MCG tablet Take 1 tablet (75 mcg total) by mouth daily at 6 (six) AM. For low functioning thyroid 30 tablet 4   . citalopram (CELEXA) 40 MG tablet Take 1 tablet (40 mg total) by mouth daily. For depression (Patient not taking: Reported on 04/21/2018) 30 tablet 0 Not Taking at Unknown time  . diclofenac sodium (VOLTAREN) 1 % GEL Apply 2 g topically 4 (four) times daily as needed (Knee pain). (Patient not taking: Reported on 04/21/2018) 1 Tube 5 Not Taking at Unknown time  . hydrOXYzine (ATARAX/VISTARIL) 25 MG tablet Take 1 tablet (25 mg total) by mouth every 6 (six) hours as needed for anxiety. (Patient not taking: Reported on 04/21/2018) 60 tablet 0 Not Taking at Unknown time  . ibuprofen (ADVIL,MOTRIN) 600 MG tablet Take 1 tablet (600 mg total) by mouth every 6 (six) hours as needed for mild pain or moderate pain. (Patient not taking: Reported on 04/21/2018) 30 tablet 0 Not Taking at Unknown time  . nicotine (NICODERM CQ - DOSED IN MG/24 HOURS) 21 mg/24hr patch Place 1 patch (21 mg total) onto the skin daily. For smoking  cessation (Patient not taking: Reported on 04/21/2018) 28 patch 0 Not Taking at Unknown time  . risperiDONE (RISPERDAL) 2 MG tablet Take 1 tablet (2 mg total) by mouth at bedtime. For mood control (Patient not taking: Reported on 04/21/2018) 30 tablet 0 Not Taking at Unknown time  . traZODone (DESYREL) 100 MG tablet Take 2  tablets (200 mg total) by mouth at bedtime. For sleep (Patient not taking: Reported on 04/21/2018) 60 tablet 0 Not Taking at Unknown time   No Known Allergies  Social History   Tobacco Use  . Smoking status: Current Every Day Smoker    Packs/day: 0.30    Types: Cigarettes  . Smokeless tobacco: Never Used  Substance Use Topics  . Alcohol use: Yes    Alcohol/week: 0.0 standard drinks    Comment: weekend-04/25/2018    No family history on file.   ROS ROS: I have reviewed the patient's review of systems thoroughly and there are no positive responses as relates to the HPI. Objective:  Physical Exam  Vital signs in last 24 hours:   Well-developed well-nourished patient in no acute distress. Alert and oriented x3 HEENT:within normal limits Cardiac: Regular rate and rhythm Pulmonary: Lungs clear to auscultation Abdomen: Soft and nontender.  Normal active bowel sounds  Musculoskeletal: (Right knee: Limited range of motion.  Painful range of motion.  No instability.  Mild inhibition and grind. Labs: Recent Results (from the past 2160 hour(s))  Surgical pcr screen     Status: Abnormal   Collection Time: 04/28/18  2:35 PM  Result Value Ref Range   MRSA, PCR NEGATIVE NEGATIVE   Staphylococcus aureus POSITIVE (A) NEGATIVE    Comment: (NOTE) The Xpert SA Assay (FDA approved for NASAL specimens in patients 59 years of age and older), is one component of a comprehensive surveillance program. It is not intended to diagnose infection nor to guide or monitor treatment. Performed at [facility], [place] [address], [place], [place] [address]   Rapid urine drug screen (hospital performed)     Status: None   Collection Time: 04/28/18  2:35 PM  Result Value Ref Range   Opiates NONE DETECTED NONE DETECTED   Cocaine NONE DETECTED NONE DETECTED   Benzodiazepines NONE DETECTED NONE DETECTED   Amphetamines NONE DETECTED NONE DETECTED   Tetrahydrocannabinol NONE DETECTED NONE  DETECTED   Barbiturates NONE DETECTED NONE DETECTED    Comment: (NOTE) DRUG SCREEN FOR MEDICAL PURPOSES ONLY.  IF CONFIRMATION IS NEEDED FOR ANY PURPOSE, NOTIFY LAB WITHIN 5 DAYS. LOWEST DETECTABLE LIMITS FOR URINE DRUG SCREEN Drug Class                     Cutoff (ng/mL) Amphetamine and metabolites    1000 Barbiturate and metabolites    200 Benzodiazepine                 811 Tricyclics and metabolites     300 Opiates and metabolites        300 Cocaine and metabolites        300 THC                            50 Performed at [facility], [place] [address], [place], [place] [address]   APTT     Status: None   Collection Time: 04/28/18  3:00 PM  Result Value Ref Range   aPTT 30 24 - 36 seconds  Comment: Performed at [facility], [place] [address], [place], [place] [address]  CBC WITH DIFFERENTIAL     Status: None   Collection Time: 04/28/18  3:00 PM  Result Value Ref Range   WBC 6.9 4.0 - 10.5 K/uL   RBC 4.61 3.87 - 5.11 MIL/uL   Hemoglobin 12.8 12.0 - 15.0 g/dL   HCT 41.0 36.0 - 46.0 %   MCV 88.9 80.0 - 100.0 fL   MCH 27.8 26.0 - 34.0 pg   MCHC 31.2 30.0 - 36.0 g/dL   RDW 13.4 11.5 - 15.5 %   Platelets 305 150 - 400 K/uL   nRBC 0.0 0.0 - 0.2 %   Neutrophils Relative % 61 %   Neutro Abs 4.2 1.7 - 7.7 K/uL   Lymphocytes Relative 32 %   Lymphs Abs 2.2 0.7 - 4.0 K/uL   Monocytes Relative 5 %   Monocytes Absolute 0.3 0.1 - 1.0 K/uL   Eosinophils Relative 2 %   Eosinophils Absolute 0.1 0.0 - 0.5 K/uL   Basophils Relative 0 %   Basophils Absolute 0.0 0.0 - 0.1 K/uL   Immature Granulocytes 0 %   Abs Immature Granulocytes 0.01 0.00 - 0.07 K/uL    Comment: Performed at [facility], [place] [address] [place], [place] [address]  Comprehensive metabolic panel     Status: Abnormal   Collection Time: 04/28/18  3:00 PM  Result Value Ref Range   Sodium 140 135 - 145 mmol/L   Potassium 3.2 (L) 3.5 - 5.1 mmol/L   Chloride 104  98 - 111 mmol/L   CO2 30 22 - 32 mmol/L   Glucose, Bld 108 (H) 70 - 99 mg/dL   BUN 11 6 - 20 mg/dL   Creatinine, Ser 0.75 0.44 - 1.00 mg/dL   Calcium 8.7 (L) 8.9 - 10.3 mg/dL   Total Protein 7.1 6.5 - 8.1 g/dL   Albumin 4.0 3.5 - 5.0 g/dL   AST 67 (H) 15 - 41 U/L   ALT 70 (H) 0 - 44 U/L   Alkaline Phosphatase 72 38 - 126 U/L   Total Bilirubin 0.8 0.3 - 1.2 mg/dL   GFR calc non Af Amer >60 >60 mL/min   GFR calc Af Amer >60 >60 mL/min    Comment: (NOTE) The eGFR has been calculated using the CKD EPI equation. This calculation has not been validated in all clinical situations. eGFR's persistently <60 mL/min signify possible Chronic Kidney Disease.    Anion gap 6 5 - 15    Comment: Performed at [facility], [place] [address], [place], [place] [address]  Protime-INR     Status: None   Collection Time: 04/28/18  3:00 PM  Result Value Ref Range   Prothrombin Time 13.2 11.4 - 15.2 seconds   INR 1.01     Comment: Performed at [facility], [place] [address] [place], [place] [address]  Type and screen Order type and screen if day of surgery is less than 15 days from draw of preadmission visit or order morning of surgery if day of surgery is greater than 6 days from preadmission visit.     Status: None   Collection Time: 04/28/18  3:00 PM  Result Value Ref Range   ABO/RH(D) AB POS    Antibody Screen NEG    Sample Expiration 05/12/2018    Extend sample reason      NO TRANSFUSIONS OR PREGNANCY IN THE PAST 3 MONTHS Performed at  [facility], [place] [address] [place], [place] [address]   ABO/Rh     Status: None   Collection Time: 04/28/18  3:03 PM  Result Value Ref Range   ABO/RH(D)      AB POS Performed at [facility], [place] [address], [place], [place] [address]   Urinalysis, Routine w reflex microscopic     Status: Abnormal   Collection Time: 04/28/18  4:54 PM  Result Value Ref Range   Color, Urine YELLOW YELLOW    APPearance CLOUDY (A) CLEAR   Specific Gravity, Urine 1.023 1.005 - 1.030   pH 6.0 5.0 - 8.0   Glucose, UA NEGATIVE NEGATIVE mg/dL   Hgb urine dipstick SMALL (A) NEGATIVE   Bilirubin Urine NEGATIVE NEGATIVE   Ketones, ur NEGATIVE NEGATIVE mg/dL   Protein, ur NEGATIVE NEGATIVE mg/dL   Nitrite NEGATIVE NEGATIVE   Leukocytes, UA TRACE (A) NEGATIVE   RBC / HPF 0-5 0 - 5 RBC/hpf   WBC, UA 6-10 0 - 5 WBC/hpf   Bacteria, UA RARE (A) NONE SEEN   Squamous Epithelial / LPF 11-20 0 - 5   Mucus PRESENT     Comment: Performed at [facility], [place] [address], [place], [place] [address]    Estimated body mass index is 34.2 kg/m as calculated from the following:   Height as of 04/28/18: 5' 1"  (1.549 m).   Weight as of 04/28/18: 82.1 kg.   Imaging Review Plain radiographs demonstrate severe degenerative joint disease of the right knee(s). The overall alignment ismild varus. The bone quality appears to be good for age and reported activity level.   Preoperative templating of the joint replacement has been completed, documented, and submitted to the Operating Room personnel in order to optimize intra-operative equipment management.    Patient's anticipated LOS is less than 2 midnights, meeting these requirements: - Younger than 67 - Lives within 1 hour of care - Has a competent adult at home to recover with post-op recover - NO history of  - Chronic pain requiring opiods  - Diabetes  - Coronary Artery Disease  - Heart failure  - Heart attack  - Stroke  - DVT/VTE  - Cardiac arrhythmia  - Respiratory Failure/COPD  - Renal failure  - Anemia  - Advanced Liver disease        Assessment/Plan:  End stage arthritis, right knee   The patient history, physical examination, clinical judgment of the provider and imaging studies are consistent with end stage degenerative joint disease of the right knee(s) and total knee arthroplasty is deemed medically necessary. The  treatment options including medical management, injection therapy arthroscopy and arthroplasty were discussed at length. The risks and benefits of total knee arthroplasty were presented and reviewed. The risks due to aseptic loosening, infection, stiffness, patella tracking problems, thromboembolic complications and other imponderables were discussed. The patient acknowledged the explanation, agreed to proceed with the plan and consent was signed. Patient is being admitted for inpatient treatment for surgery, pain control, PT, OT, prophylactic antibiotics, VTE prophylaxis, progressive ambulation and ADL's and discharge planning. The patient is planning to be discharged home with home health services

## 2018-05-09 ENCOUNTER — Inpatient Hospital Stay (HOSPITAL_COMMUNITY)
Admission: RE | Admit: 2018-05-09 | Discharge: 2018-05-11 | DRG: 470 | Disposition: A | Discharge status: Home Health | Payer: Medicaid Other | Attending: Orthopedic Surgery | Admitting: Orthopedic Surgery

## 2018-05-09 ENCOUNTER — Encounter (HOSPITAL_COMMUNITY)
Admission: RE | Disposition: A | Discharge status: Home Health | Payer: Self-pay | Source: Home / Self Care | Attending: Orthopedic Surgery

## 2018-05-09 ENCOUNTER — Encounter (HOSPITAL_COMMUNITY): Payer: Self-pay | Admitting: Emergency Medicine

## 2018-05-09 ENCOUNTER — Ambulatory Visit (HOSPITAL_COMMUNITY): Payer: Medicaid Other | Admitting: Anesthesiology

## 2018-05-09 ENCOUNTER — Other Ambulatory Visit: Payer: Self-pay

## 2018-05-09 DIAGNOSIS — M1711 Unilateral primary osteoarthritis, right knee: Secondary | ICD-10-CM | POA: Diagnosis present

## 2018-05-09 DIAGNOSIS — E890 Postprocedural hypothyroidism: Secondary | ICD-10-CM | POA: Diagnosis present

## 2018-05-09 DIAGNOSIS — F17210 Nicotine dependence, cigarettes, uncomplicated: Secondary | ICD-10-CM | POA: Diagnosis present

## 2018-05-09 DIAGNOSIS — E785 Hyperlipidemia, unspecified: Secondary | ICD-10-CM | POA: Diagnosis present

## 2018-05-09 DIAGNOSIS — Z8544 Personal history of malignant neoplasm of other female genital organs: Secondary | ICD-10-CM | POA: Diagnosis not present

## 2018-05-09 DIAGNOSIS — Z79890 Hormone replacement therapy (postmenopausal): Secondary | ICD-10-CM

## 2018-05-09 DIAGNOSIS — M25561 Pain in right knee: Secondary | ICD-10-CM | POA: Diagnosis present

## 2018-05-09 DIAGNOSIS — I10 Essential (primary) hypertension: Secondary | ICD-10-CM | POA: Diagnosis present

## 2018-05-09 DIAGNOSIS — M170 Bilateral primary osteoarthritis of knee: Secondary | ICD-10-CM | POA: Diagnosis present

## 2018-05-09 HISTORY — PX: TOTAL KNEE ARTHROPLASTY: SHX125

## 2018-05-09 LAB — RAPID URINE DRUG SCREEN, HOSP PERFORMED
AMPHETAMINES: NOT DETECTED
BARBITURATES: NOT DETECTED
BENZODIAZEPINES: POSITIVE — AB
Cocaine: NOT DETECTED
Opiates: NOT DETECTED
TETRAHYDROCANNABINOL: NOT DETECTED

## 2018-05-09 SURGERY — ARTHROPLASTY, KNEE, TOTAL
Anesthesia: Spinal | Site: Knee | Laterality: Right

## 2018-05-09 MED ORDER — CLONIDINE HCL (ANALGESIA) 100 MCG/ML EP SOLN
EPIDURAL | Status: DC | PRN
  Administered 2018-05-09: 50 ug

## 2018-05-09 MED ORDER — ONDANSETRON HCL 4 MG/2ML IJ SOLN
INTRAMUSCULAR | Status: DC | PRN
  Administered 2018-05-09: 4 mg via INTRAVENOUS

## 2018-05-09 MED ORDER — OXYCODONE HCL 5 MG PO TABS
5.0000 mg | ORAL_TABLET | ORAL | Status: DC | PRN
  Administered 2018-05-09 – 2018-05-11 (×13): 5 mg via ORAL
  Filled 2018-05-09 (×13): qty 1

## 2018-05-09 MED ORDER — PROPOFOL 10 MG/ML IV BOLUS
INTRAVENOUS | Status: DC | PRN
  Administered 2018-05-09 (×2): 10 mg via INTRAVENOUS
  Administered 2018-05-09: 140 mg via INTRAVENOUS

## 2018-05-09 MED ORDER — HYDROMORPHONE HCL 1 MG/ML IJ SOLN: 0.5000 mg | INTRAMUSCULAR | Status: DC | PRN

## 2018-05-09 MED ORDER — FENTANYL CITRATE (PF) 100 MCG/2ML IJ SOLN
INTRAMUSCULAR | Status: AC
  Filled 2018-05-09: qty 2

## 2018-05-09 MED ORDER — GABAPENTIN 300 MG PO CAPS
300.0000 mg | ORAL_CAPSULE | Freq: Two times a day (BID) | ORAL | Status: DC
  Administered 2018-05-09 – 2018-05-11 (×4): 300 mg via ORAL
  Filled 2018-05-09 (×4): qty 1

## 2018-05-09 MED ORDER — OXYCODONE-ACETAMINOPHEN 5-325 MG PO TABS: 1.0000 | ORAL_TABLET | Freq: Four times a day (QID) | ORAL | 0 refills | Status: DC | PRN

## 2018-05-09 MED ORDER — ACETAMINOPHEN 325 MG PO TABS
325.0000 mg | ORAL_TABLET | Freq: Four times a day (QID) | ORAL | Status: DC | PRN
  Administered 2018-05-09 – 2018-05-10 (×3): 650 mg via ORAL
  Filled 2018-05-09 (×3): qty 2

## 2018-05-09 MED ORDER — STERILE WATER FOR IRRIGATION IR SOLN
Status: DC | PRN
  Administered 2018-05-09: 2000 mL

## 2018-05-09 MED ORDER — HYDROMORPHONE HCL 1 MG/ML IJ SOLN
0.2500 mg | INTRAMUSCULAR | Status: DC | PRN
  Administered 2018-05-09 (×4): 0.5 mg via INTRAVENOUS

## 2018-05-09 MED ORDER — DEXAMETHASONE SODIUM PHOSPHATE 10 MG/ML IJ SOLN
INTRAMUSCULAR | Status: DC | PRN
  Administered 2018-05-09: 10 mg via INTRAVENOUS

## 2018-05-09 MED ORDER — PROMETHAZINE HCL 25 MG/ML IJ SOLN: 6.2500 mg | INTRAMUSCULAR | Status: DC | PRN

## 2018-05-09 MED ORDER — ASPIRIN EC 325 MG PO TBEC: 325.0000 mg | DELAYED_RELEASE_TABLET | Freq: Two times a day (BID) | ORAL | 0 refills | Status: DC

## 2018-05-09 MED ORDER — METHOCARBAMOL 500 MG PO TABS
500.0000 mg | ORAL_TABLET | Freq: Four times a day (QID) | ORAL | Status: DC | PRN
  Administered 2018-05-09 – 2018-05-10 (×3): 500 mg via ORAL
  Filled 2018-05-09 (×3): qty 1

## 2018-05-09 MED ORDER — SODIUM CHLORIDE (PF) 0.9 % IJ SOLN
INTRAMUSCULAR | Status: DC | PRN
  Administered 2018-05-09: 50 mL

## 2018-05-09 MED ORDER — PROPOFOL 10 MG/ML IV BOLUS
INTRAVENOUS | Status: AC
  Filled 2018-05-09: qty 60

## 2018-05-09 MED ORDER — LACTATED RINGERS IV SOLN
INTRAVENOUS | Status: DC
  Administered 2018-05-09 (×2): via INTRAVENOUS

## 2018-05-09 MED ORDER — MAGNESIUM CITRATE PO SOLN: 1.0000 | Freq: Once | ORAL | Status: DC | PRN

## 2018-05-09 MED ORDER — METHOCARBAMOL 500 MG IVPB - SIMPLE MED
500.0000 mg | Freq: Four times a day (QID) | INTRAVENOUS | Status: DC | PRN
  Administered 2018-05-09: 500 mg via INTRAVENOUS
  Filled 2018-05-09: qty 50

## 2018-05-09 MED ORDER — SODIUM CHLORIDE 0.9 % IR SOLN
Status: DC | PRN
  Administered 2018-05-09: 1000 mL

## 2018-05-09 MED ORDER — ONDANSETRON HCL 4 MG PO TABS: 4.0000 mg | ORAL_TABLET | Freq: Four times a day (QID) | ORAL | Status: DC | PRN

## 2018-05-09 MED ORDER — DOCUSATE SODIUM 100 MG PO CAPS
100.0000 mg | ORAL_CAPSULE | Freq: Two times a day (BID) | ORAL | Status: DC
  Administered 2018-05-09 – 2018-05-11 (×4): 100 mg via ORAL
  Filled 2018-05-09 (×4): qty 1

## 2018-05-09 MED ORDER — TRANEXAMIC ACID-NACL 1000-0.7 MG/100ML-% IV SOLN
1000.0000 mg | Freq: Once | INTRAVENOUS | Status: AC
  Administered 2018-05-09: 1000 mg via INTRAVENOUS
  Filled 2018-05-09: qty 100

## 2018-05-09 MED ORDER — DEXAMETHASONE SODIUM PHOSPHATE 10 MG/ML IJ SOLN
10.0000 mg | Freq: Two times a day (BID) | INTRAMUSCULAR | Status: AC
  Administered 2018-05-09 – 2018-05-10 (×3): 10 mg via INTRAVENOUS
  Filled 2018-05-09 (×3): qty 1

## 2018-05-09 MED ORDER — 0.9 % SODIUM CHLORIDE (POUR BTL) OPTIME
TOPICAL | Status: DC | PRN
  Administered 2018-05-09: 1000 mL

## 2018-05-09 MED ORDER — DIPHENHYDRAMINE HCL 12.5 MG/5ML PO ELIX: 12.5000 mg | ORAL_SOLUTION | ORAL | Status: DC | PRN

## 2018-05-09 MED ORDER — BUPIVACAINE-EPINEPHRINE (PF) 0.25% -1:200000 IJ SOLN
INTRAMUSCULAR | Status: DC | PRN
  Administered 2018-05-09: 30 mL

## 2018-05-09 MED ORDER — ONDANSETRON HCL 4 MG/2ML IJ SOLN
INTRAMUSCULAR | Status: AC
  Filled 2018-05-09: qty 2

## 2018-05-09 MED ORDER — CITALOPRAM HYDROBROMIDE 20 MG PO TABS
40.0000 mg | ORAL_TABLET | Freq: Every day | ORAL | Status: DC
  Administered 2018-05-10 – 2018-05-11 (×2): 40 mg via ORAL
  Filled 2018-05-09 (×2): qty 2

## 2018-05-09 MED ORDER — ALBUTEROL SULFATE HFA 108 (90 BASE) MCG/ACT IN AERS
INHALATION_SPRAY | RESPIRATORY_TRACT | Status: AC
  Filled 2018-05-09: qty 6.7

## 2018-05-09 MED ORDER — HYDROXYZINE HCL 25 MG PO TABS: 25.0000 mg | ORAL_TABLET | Freq: Four times a day (QID) | ORAL | Status: DC | PRN

## 2018-05-09 MED ORDER — CHLORHEXIDINE GLUCONATE 4 % EX LIQD: 60.0000 mL | Freq: Once | CUTANEOUS | Status: DC

## 2018-05-09 MED ORDER — FENTANYL CITRATE (PF) 100 MCG/2ML IJ SOLN
INTRAMUSCULAR | Status: DC | PRN
  Administered 2018-05-09 (×4): 25 ug via INTRAVENOUS
  Administered 2018-05-09: 50 ug via INTRAVENOUS
  Administered 2018-05-09 (×3): 25 ug via INTRAVENOUS
  Administered 2018-05-09: 50 ug via INTRAVENOUS
  Administered 2018-05-09 (×3): 25 ug via INTRAVENOUS
  Administered 2018-05-09: 50 ug via INTRAVENOUS

## 2018-05-09 MED ORDER — TRANEXAMIC ACID-NACL 1000-0.7 MG/100ML-% IV SOLN
1000.0000 mg | INTRAVENOUS | Status: AC
  Administered 2018-05-09: 1000 mg via INTRAVENOUS
  Filled 2018-05-09: qty 100

## 2018-05-09 MED ORDER — ONDANSETRON HCL 4 MG/2ML IJ SOLN: 4.0000 mg | Freq: Four times a day (QID) | INTRAMUSCULAR | Status: DC | PRN

## 2018-05-09 MED ORDER — TIZANIDINE HCL 2 MG PO TABS: 2.0000 mg | ORAL_TABLET | Freq: Three times a day (TID) | ORAL | 0 refills | Status: DC | PRN

## 2018-05-09 MED ORDER — ALUM & MAG HYDROXIDE-SIMETH 200-200-20 MG/5ML PO SUSP: 30.0000 mL | ORAL | Status: DC | PRN

## 2018-05-09 MED ORDER — METHOCARBAMOL 500 MG IVPB - SIMPLE MED
INTRAVENOUS | Status: AC
  Administered 2018-05-09: 500 mg via INTRAVENOUS
  Filled 2018-05-09: qty 50

## 2018-05-09 MED ORDER — CEFAZOLIN SODIUM-DEXTROSE 2-4 GM/100ML-% IV SOLN
2.0000 g | Freq: Four times a day (QID) | INTRAVENOUS | Status: AC
  Administered 2018-05-09 (×2): 2 g via INTRAVENOUS
  Filled 2018-05-09 (×2): qty 100

## 2018-05-09 MED ORDER — POLYETHYLENE GLYCOL 3350 17 G PO PACK: 17.0000 g | PACK | Freq: Every day | ORAL | Status: DC | PRN

## 2018-05-09 MED ORDER — BUPIVACAINE-EPINEPHRINE (PF) 0.25% -1:200000 IJ SOLN
INTRAMUSCULAR | Status: AC
  Filled 2018-05-09: qty 30

## 2018-05-09 MED ORDER — ROPIVACAINE HCL 7.5 MG/ML IJ SOLN
INTRAMUSCULAR | Status: DC | PRN
  Administered 2018-05-09: 20 mL via PERINEURAL

## 2018-05-09 MED ORDER — DEXAMETHASONE SODIUM PHOSPHATE 10 MG/ML IJ SOLN
INTRAMUSCULAR | Status: AC
  Filled 2018-05-09: qty 1

## 2018-05-09 MED ORDER — ASPIRIN EC 325 MG PO TBEC
325.0000 mg | DELAYED_RELEASE_TABLET | Freq: Two times a day (BID) | ORAL | Status: DC
  Administered 2018-05-09 – 2018-05-11 (×4): 325 mg via ORAL
  Filled 2018-05-09 (×4): qty 1

## 2018-05-09 MED ORDER — DOCUSATE SODIUM 100 MG PO CAPS: 100.0000 mg | ORAL_CAPSULE | Freq: Two times a day (BID) | ORAL | 0 refills | Status: DC

## 2018-05-09 MED ORDER — SODIUM CHLORIDE 0.9 % IV SOLN
INTRAVENOUS | Status: DC
  Administered 2018-05-09 (×2): via INTRAVENOUS

## 2018-05-09 MED ORDER — LIDOCAINE 2% (20 MG/ML) 5 ML SYRINGE
INTRAMUSCULAR | Status: DC | PRN
  Administered 2018-05-09: 100 mg via INTRAVENOUS

## 2018-05-09 MED ORDER — MIDAZOLAM HCL 2 MG/2ML IJ SOLN
INTRAMUSCULAR | Status: AC
  Filled 2018-05-09: qty 2

## 2018-05-09 MED ORDER — RISPERIDONE 1 MG PO TABS
2.0000 mg | ORAL_TABLET | Freq: Every day | ORAL | Status: DC
  Administered 2018-05-09 – 2018-05-10 (×2): 2 mg via ORAL
  Filled 2018-05-09 (×2): qty 2

## 2018-05-09 MED ORDER — TRANEXAMIC ACID-NACL 1000-0.7 MG/100ML-% IV SOLN
1000.0000 mg | INTRAVENOUS | Status: DC
  Filled 2018-05-09: qty 100

## 2018-05-09 MED ORDER — HYDROMORPHONE HCL 1 MG/ML IJ SOLN
INTRAMUSCULAR | Status: AC
  Administered 2018-05-09: 0.5 mg via INTRAVENOUS
  Filled 2018-05-09: qty 2

## 2018-05-09 MED ORDER — BISACODYL 5 MG PO TBEC: 5.0000 mg | DELAYED_RELEASE_TABLET | Freq: Every day | ORAL | Status: DC | PRN

## 2018-05-09 MED ORDER — LEVOTHYROXINE SODIUM 75 MCG PO TABS
75.0000 ug | ORAL_TABLET | Freq: Every day | ORAL | Status: DC
  Administered 2018-05-10 – 2018-05-11 (×2): 75 ug via ORAL
  Filled 2018-05-09 (×2): qty 1

## 2018-05-09 MED ORDER — CEFAZOLIN SODIUM-DEXTROSE 2-4 GM/100ML-% IV SOLN
2.0000 g | INTRAVENOUS | Status: AC
  Administered 2018-05-09: 2 g via INTRAVENOUS
  Filled 2018-05-09: qty 100

## 2018-05-09 MED ORDER — SODIUM CHLORIDE (PF) 0.9 % IJ SOLN
INTRAMUSCULAR | Status: AC
  Filled 2018-05-09: qty 50

## 2018-05-09 MED ORDER — MIDAZOLAM HCL 2 MG/2ML IJ SOLN
INTRAMUSCULAR | Status: DC | PRN
  Administered 2018-05-09 (×2): 1 mg via INTRAVENOUS

## 2018-05-09 MED ORDER — CELECOXIB 200 MG PO CAPS
200.0000 mg | ORAL_CAPSULE | Freq: Two times a day (BID) | ORAL | Status: DC
  Administered 2018-05-09 – 2018-05-11 (×4): 200 mg via ORAL
  Filled 2018-05-09 (×4): qty 1

## 2018-05-09 MED ORDER — BUPIVACAINE LIPOSOME 1.3 % IJ SUSP
INTRAMUSCULAR | Status: DC | PRN
  Administered 2018-05-09: 20 mL

## 2018-05-09 SURGICAL SUPPLY — 59 items
APL SKNCLS STERI-STRIP NONHPOA (GAUZE/BANDAGES/DRESSINGS) ×1
ATTUNE MED DOME PAT 32 KNEE (Knees) ×1 IMPLANT
ATTUNE MED DOME PAT 32MM KNEE (Knees) ×1 IMPLANT
ATTUNE PS FEM RT SZ 3 CEM KNEE (Femur) ×2 IMPLANT
ATTUNE PSRP INSR SZ3 5 KNEE (Insert) ×1 IMPLANT
ATTUNE PSRP INSR SZ3 5MM KNEE (Insert) ×1 IMPLANT
BAG SPEC THK2 15X12 ZIP CLS (MISCELLANEOUS) ×1
BAG ZIPLOCK 12X15 (MISCELLANEOUS) ×3 IMPLANT
BANDAGE ACE 6X5 VEL STRL LF (GAUZE/BANDAGES/DRESSINGS) ×3 IMPLANT
BANDAGE ELASTIC 6 VELCRO ST LF (GAUZE/BANDAGES/DRESSINGS) ×2 IMPLANT
BASE TIBIAL ROT PLAT SZ 3 KNEE (Knees) IMPLANT
BENZOIN TINCTURE PRP APPL 2/3 (GAUZE/BANDAGES/DRESSINGS) ×3 IMPLANT
BLADE SAGITTAL 25.0X1.19X90 (BLADE) ×2 IMPLANT
BLADE SAGITTAL 25.0X1.19X90MM (BLADE) ×1
BLADE SAW SGTL 11.0X1.19X90.0M (BLADE) ×3 IMPLANT
BOOTIES KNEE HIGH SLOAN (MISCELLANEOUS) ×3 IMPLANT
BOWL SMART MIX CTS (DISPOSABLE) ×3 IMPLANT
BSPLAT TIB 3 CMNT ROT PLAT STR (Knees) ×1 IMPLANT
CEMENT HV SMART SET (Cement) ×4 IMPLANT
CLOSURE WOUND 1/2 X4 (GAUZE/BANDAGES/DRESSINGS) ×1
COVER WAND RF STERILE (DRAPES) ×2 IMPLANT
CUFF TOURN SGL QUICK 34 (TOURNIQUET CUFF) ×3
CUFF TRNQT CYL 34X4X40X1 (TOURNIQUET CUFF) ×1 IMPLANT
DECANTER SPIKE VIAL GLASS SM (MISCELLANEOUS) ×2 IMPLANT
DRAPE U-SHAPE 47X51 STRL (DRAPES) ×3 IMPLANT
DRSG AQUACEL AG ADV 3.5X10 (GAUZE/BANDAGES/DRESSINGS) ×3 IMPLANT
DURAPREP 26ML APPLICATOR (WOUND CARE) ×3 IMPLANT
ELECT REM PT RETURN 15FT ADLT (MISCELLANEOUS) ×3 IMPLANT
GLOVE BIOGEL PI IND STRL 8 (GLOVE) ×2 IMPLANT
GLOVE BIOGEL PI INDICATOR 8 (GLOVE) ×4
GLOVE ECLIPSE 7.5 STRL STRAW (GLOVE) ×6 IMPLANT
GOWN STRL REUS W/TWL XL LVL3 (GOWN DISPOSABLE) ×6 IMPLANT
HANDPIECE INTERPULSE COAX TIP (DISPOSABLE) ×3
HOLDER FOLEY CATH W/STRAP (MISCELLANEOUS) ×2 IMPLANT
HOOD PEEL AWAY FLYTE STAYCOOL (MISCELLANEOUS) ×9 IMPLANT
IMMOBILIZER KNEE 20 (SOFTGOODS) ×3
IMMOBILIZER KNEE 20 THIGH 36 (SOFTGOODS) ×1 IMPLANT
MANIFOLD NEPTUNE II (INSTRUMENTS) ×3 IMPLANT
NEEDLE HYPO 22GX1.5 SAFETY (NEEDLE) ×3 IMPLANT
NS IRRIG 1000ML POUR BTL (IV SOLUTION) ×3 IMPLANT
PACK ICE MAXI GEL EZY WRAP (MISCELLANEOUS) ×3 IMPLANT
PACK TOTAL KNEE CUSTOM (KITS) ×3 IMPLANT
PADDING CAST COTTON 6X4 STRL (CAST SUPPLIES) ×3 IMPLANT
POSITIONER SURGICAL ARM (MISCELLANEOUS) ×3 IMPLANT
SET HNDPC FAN SPRY TIP SCT (DISPOSABLE) ×1 IMPLANT
STAPLER VISISTAT 35W (STAPLE) IMPLANT
STRIP CLOSURE SKIN 1/2X4 (GAUZE/BANDAGES/DRESSINGS) ×1 IMPLANT
SUT MNCRL AB 3-0 PS2 18 (SUTURE) ×3 IMPLANT
SUT VIC AB 0 CT1 36 (SUTURE) ×3 IMPLANT
SUT VIC AB 1 CT1 36 (SUTURE) ×6 IMPLANT
SUT VIC AB 2-0 CT1 27 (SUTURE) ×6
SUT VIC AB 2-0 CT1 TAPERPNT 27 (SUTURE) ×2 IMPLANT
SYR CONTROL 10ML LL (SYRINGE) ×6 IMPLANT
TIBIAL BASE ROT PLAT SZ 3 KNEE (Knees) ×3 IMPLANT
TRAY FOLEY CATH 14FRSI W/METER (CATHETERS) ×2 IMPLANT
TRAY FOLEY MTR SLVR 16FR STAT (SET/KITS/TRAYS/PACK) ×1 IMPLANT
WATER STERILE IRR 1000ML POUR (IV SOLUTION) ×6 IMPLANT
WRAP KNEE MAXI GEL POST OP (GAUZE/BANDAGES/DRESSINGS) ×2 IMPLANT
YANKAUER SUCT BULB TIP 10FT TU (MISCELLANEOUS) ×3 IMPLANT

## 2018-05-09 NOTE — Anesthesia Procedure Notes (Signed)
Procedure Name: LMA Insertion Date/Time: 05/09/2018 7:31 AM Performed by: Dione Booze, CRNA Pre-anesthesia Checklist: Emergency Drugs available, Suction available, Patient being monitored and Patient identified Patient Re-evaluated:Patient Re-evaluated prior to induction Oxygen Delivery Method: Circle system utilized Preoxygenation: Pre-oxygenation with 100% oxygen Induction Type: IV induction LMA: LMA inserted LMA Size: 4.0 Placement Confirmation: positive ETCO2 and breath sounds checked- equal and bilateral Tube secured with: Tape Dental Injury: Teeth and Oropharynx as per pre-operative assessment  Comments: Extremely poor dentition.

## 2018-05-09 NOTE — Interval H&P Note (Signed)
History and Physical Interval Note:  05/09/2018 7:08 AM  [name]  has presented today for surgery, with the diagnosis of OSTEOARTHRITIS RIGHT KNEE  The various methods of treatment have been discussed with the patient and family. After consideration of risks, benefits and other options for treatment, the patient has consented to  Procedure(s): TOTAL KNEE ARTHROPLASTY (Right) as a surgical intervention .  The patient's history has been reviewed, patient examined, no change in status, stable for surgery.  I have reviewed the patient's chart and labs.  Questions were answered to the patient's satisfaction.     Alta Corning

## 2018-05-09 NOTE — Brief Op Note (Signed)
05/09/2018  9:56 AM  PATIENT:  [name]  57 y.o. female  PRE-OPERATIVE DIAGNOSIS:  OSTEOARTHRITIS RIGHT KNEE  POST-OPERATIVE DIAGNOSIS:  OSTEOARTHRITIS RIGHT KNEE  PROCEDURE:  Procedure(s): TOTAL KNEE ARTHROPLASTY (Right)  SURGEON:  Surgeon(s) and Role:    Dorna Leitz, MD - Primary  PHYSICIAN ASSISTANT:   ASSISTANTS: jim bethune   ANESTHESIA:   general  EBL:  50 mL   BLOOD ADMINISTERED:none  DRAINS: none   LOCAL MEDICATIONS USED:  MARCAINE    and OTHER experel  SPECIMEN:  No Specimen  DISPOSITION OF SPECIMEN:  N/A  COUNTS:  YES  TOURNIQUET:   Total Tourniquet Time Documented: Thigh (Right) - 64 minutes Total: Thigh (Right) - 64 minutes   DICTATION: .Other Dictation: Dictation Number [id number]  PLAN OF CARE: Admit for overnight observation  PATIENT DISPOSITION:  PACU - hemodynamically stable.   Delay start of Pharmacological VTE agent (>24hrs) due to surgical blood loss or risk of bleeding: yes

## 2018-05-09 NOTE — Transfer of Care (Signed)
Immediate Anesthesia Transfer of Care Note  Patient: [name]  Procedure(s) Performed: TOTAL KNEE ARTHROPLASTY (Right Knee)  Patient Location: PACU  Anesthesia Type:General and Regional  Level of Consciousness: awake, alert  and patient cooperative  Airway & Oxygen Therapy: Patient Spontanous Breathing and Patient connected to face mask oxygen  Post-op Assessment: Report given to RN and Post -op Vital signs reviewed and stable  Post vital signs: Reviewed and stable  Last Vitals:  Vitals Value Taken Time  BP    Temp    Pulse 76 05/09/2018  9:40 AM  Resp 13 05/09/2018  9:40 AM  SpO2 92 % 05/09/2018  9:40 AM  Vitals shown include unvalidated device data.  Last Pain:  Vitals:   05/09/18 0552  TempSrc: Oral  PainSc:       Patients Stated Pain Goal: 4 (90/38/33 3832)  Complications: No apparent anesthesia complications

## 2018-05-09 NOTE — Anesthesia Postprocedure Evaluation (Signed)
Anesthesia Post Note  Patient: [name]  Procedure(s) Performed: TOTAL KNEE ARTHROPLASTY (Right Knee)     Patient location during evaluation: PACU Anesthesia Type: General Level of consciousness: awake and alert Pain management: pain level controlled Vital Signs Assessment: post-procedure vital signs reviewed and stable Respiratory status: spontaneous breathing, nonlabored ventilation, respiratory function stable and patient connected to nasal cannula oxygen Cardiovascular status: blood pressure returned to baseline and stable Postop Assessment: no apparent nausea or vomiting Anesthetic complications: no    Last Vitals:  Vitals:   05/09/18 1040 05/09/18 1045  BP:  136/79  Pulse: (!) 57 (!) 55  Resp: 13 13  Temp:    SpO2: 97% 98%    Last Pain:  Vitals:   05/09/18 1045  TempSrc:   PainSc: Asleep                 Monda Chastain S

## 2018-05-09 NOTE — Anesthesia Preprocedure Evaluation (Signed)
Anesthesia Evaluation  Patient identified by MRN, date of birth, ID band Patient awake    Reviewed: Allergy & Precautions, NPO status , Patient's Chart, lab work & pertinent test results  Airway Mallampati: II  TM Distance: >3 FB Neck ROM: Full    Dental no notable dental hx.    Pulmonary Current Smoker,    Pulmonary exam normal breath sounds clear to auscultation       Cardiovascular hypertension, Normal cardiovascular exam Rhythm:Regular Rate:Normal     Neuro/Psych negative neurological ROS  negative psych ROS   GI/Hepatic negative GI ROS, (+)     substance abuse  cocaine use,   Endo/Other  Hypothyroidism   Renal/GU negative Renal ROS  negative genitourinary   Musculoskeletal negative musculoskeletal ROS (+)   Abdominal   Peds negative pediatric ROS (+)  Hematology negative hematology ROS (+)   Anesthesia Other Findings   Reproductive/Obstetrics negative OB ROS                             Anesthesia Physical Anesthesia Plan  ASA: III  Anesthesia Plan: Spinal   Post-op Pain Management:  Regional for Post-op pain   Induction: Intravenous  PONV Risk Score and Plan: 2 and Ondansetron and Dexamethasone  Airway Management Planned: Simple Face Mask  Additional Equipment:   Intra-op Plan:   Post-operative Plan:   Informed Consent: I have reviewed the patients History and Physical, chart, labs and discussed the procedure including the risks, benefits and alternatives for the proposed anesthesia with the patient or authorized representative who has indicated his/her understanding and acceptance.   Dental advisory given  Plan Discussed with: CRNA and Surgeon  Anesthesia Plan Comments:         Anesthesia Quick Evaluation

## 2018-05-09 NOTE — Anesthesia Procedure Notes (Signed)
Anesthesia Procedure Image    

## 2018-05-09 NOTE — Anesthesia Procedure Notes (Signed)
Anesthesia Regional Block: Adductor canal block   Pre-Anesthetic Checklist: ,, timeout performed, Correct Patient, Correct Site, Correct Laterality, Correct Procedure, Correct Position, site marked, Risks and benefits discussed,  Surgical consent,  Pre-op evaluation,  At surgeon's request and post-op pain management  Laterality: Right  Prep: chloraprep       Needles:  Injection technique: Single-shot  Needle Type: Echogenic Needle     Needle Length: 9cm      Additional Needles:   Procedures:,,,, ultrasound used (permanent image in chart),,,,  Narrative:  Start time: 05/09/2018 6:54 AM End time: 05/09/2018 7:01 AM Injection made incrementally with aspirations every 5 mL.  Performed by: Personally  Anesthesiologist: Myrtie Soman, MD  Additional Notes: Patient tolerated the procedure well without complications

## 2018-05-09 NOTE — Plan of Care (Signed)
Plan of care 

## 2018-05-09 NOTE — Op Note (Signed)
NAME: [name], BUNYAN MEDICAL RECORD ZO:[id number] ACCOUNT [id number] DATE OF BIRTH:December 17, 1960 FACILITY: [facility] LOCATION: [facility] PHYSICIAN:Albion Weatherholtz L. Raylee Adamec, MD  OPERATIVE REPORT  DATE OF PROCEDURE:  05/09/2018  PREOPERATIVE DIAGNOSIS:  End-stage degenerative joint disease, bilateral knees.  POSTOPERATIVE DIAGNOSIS:  End-stage degenerative joint disease, bilateral knees.  PROCEDURE:  Right total knee replacement with an Attune system, size 3 femur, size 3 tibia, 5 mm bridging bearing, and a 32 mm all polyethylene patella.  SURGEON:  Dorna Leitz, MD  ASSISTANT:  Gaspar Skeeters, who was present for the entire case and assisted with retraction, bone cuts, and closing to minimize over time  ANESTHESIA:  General.  BRIEF HISTORY:  The patient is a 57 year old female with a long history of significant complaints of bilateral knee pain.  She had x-rays showing severe end-stage degenerative joint disease of the left knee.  She had severe degenerative joint disease of  the right knee.  Right knee was bothering her more and the left so we ultimately elected to do it first.  She was brought to the operating room for this procedure after failure of conservative care including injection therapy, activity modification, and  she was having night pain and light activity pain.  DESCRIPTION OF PROCEDURE:  The patient brought to the operating room after adequate anesthesia was obtained with general anesthetic, the patient was taken to the operating table.  Right leg was prepped and draped in usual sterile fashion.  Following  this, the leg was exsanguinated, blood pressure tourniquet inflated to 300 mmHg.  Following this, a midline incision was made in subcutaneous and was dissected down to the level of the extensor mechanism and a medial parapatellar arthrotomy was  undertaken.  Following this, the medial and lateral meniscus were removed, retropatellar fat pad, synovium on the anterior aspect of the femur, and  the anterior and posterior cruciates.  Following this, an intramedullary pilot hole was drilled in the  femur and a 5 degree valgus inclination block is used and the distal femur was cut with 9 mm of distal femur being cut.  Following this, attention was turned towards sizing the femur.  It sized to a 3.  Anterior and posterior cuts were made chamfers and  box.  Attention was turned to the tibia.  Osteophytes were removed and the tibia was cut perpendicular to its long axis 2 mm off of the low side.  Once this was done, the a spacer box was attempted to be put in place fairly tight and cannot really get it  in.  I felt the bone cuts had been adequate and certainly she was appropriate in flexion.  At this point, we just worked on getting the distal femur on the medial side cut better and getting osteophytes off the medial side and releasing the  posteromedial capsule.  Once we did this, the 5 fit in quite nicely.  At this point, the attention was turned to the tibia.  It was drilled and keeled and sized to a 3.  Following this, the trials were put in place of the femur and tibia.  A 5 mm  bridging bearing was placed.  Full extension was achieved.  Flexion balance was good.  Attention was turned to the patella.  It was cut down to the level of 13 mm and a 32 paddle was chosen and lugs were drilled for the patella.  The knee was put through  a range of motion, excellent stability and range of motion are achieved.  All  trial components were removed at this point.  The knee was then copiously and thoroughly lavaged with pulsatile lavage irrigation.  Bone plugs were put into the distal femur.   Exparel was used on the bone and the subcutaneous tissues and the bone was then completely and thoroughly washed and dried.  Once this was completed, the final components were cemented into place, size 3 tibia, size 3 femur, 5 mm bridging bearing trial  was placed.  A 32 all poly patella was placed and held with a clamp.   All excess bone cement was removed.  Cement allowed to completely harden.  During this time, Exparel was instilled throughout the synovial reflections as well as posteromedial and  posterolateral for pain control.  Once this was completed, attention was turned towards the final range of motion.  Once the cement hardened and all excess bone cement was removed, the tourniquet was let down.  At this point, attention turned towards  removing the trial poly.  Look is made for any bleeding, none was seen.  The final 5 poly was placed and the knee put through a range of motion, excellent stability and range of motion achieved.  Medial parapatellar arthrotomy was closed with #1 Vicryl  running at this time.  Skin with 0 and 2-0 Vicryl and 3-0 Monocryl subcuticular.  Benzoin and Steri-Strips were applied.  Sterile compressive dressing was applied and the patient was taken to recovery was noted to be in satisfactory condition.  Estimated  blood loss for the procedure is minimal.  Of note, the surgeon on the case was Dorna Leitz, MD.  The assistant was Gaspar Skeeters who was present throughout the case and assisted in retraction as well as bone cuts as well as well as closure to minimize OR  time.  TN/NUANCE  D:05/09/2018 T:05/09/2018 JOB:[id number]

## 2018-05-09 NOTE — Discharge Instructions (Signed)

## 2018-05-09 NOTE — Evaluation (Signed)
Physical Therapy Evaluation Patient Details Name: [name] MRN: [id number] DOB: 1960/08/18 Today's Date: 05/09/2018   History of Present Illness  57 yo female s/p R TKR on 05/09/18. PMH includes OA, polysubstance abuse, depression, HLD, HTN, squamous cell carcinoma s/p resection 2011.   Clinical Impression   Pt presents with R knee pain, decreased R knee ROM, difficulty performing bed mobility, and decreased tolerance for ambulation due to R knee pain. Pt to benefit from acute PT to address deficits. Pt ambulated 25 ft with RW with min guard assist, verbal cuing provided throughout for sequencing and safety. Pt educated on quad sets (5-10/hour), ankle pumps (20/hour), and heel slides (5-10/hour) to perform this afternoon/evening to lessen stiffness and increase circulation, to pt's tolerance and limited by pain. PT to progress mobility as tolerated, and will continue to follow acutely.    BP,HR upon sitting EOB: 142/88, pulse 66bpm     Follow Up Recommendations Follow surgeon's recommendation for DC plan and follow-up therapies;Supervision for mobility/OOB(HHPT )    Equipment Recommendations  None recommended by PT    Recommendations for Other Services       Precautions / Restrictions Precautions Precautions: Fall Required Braces or Orthoses: Knee Immobilizer - Right Knee Immobilizer - Right: On when out of bed or walking;Discontinue once straight leg raise with < 10 degree lag Restrictions Weight Bearing Restrictions: No Other Position/Activity Restrictions: WBAT       Mobility  Bed Mobility Overal bed mobility: Needs Assistance Bed Mobility: Supine to Sit     Supine to sit: Min assist;HOB elevated     General bed mobility comments: Min assist for RLE management, scooting to EOB. Increased time and effort.   Transfers Overall transfer level: Needs assistance Equipment used: Rolling walker (2 wheeled) Transfers: Sit to/from Stand Sit to Stand: Min assist;From  elevated surface;+2 physical assistance         General transfer comment: Min assist for power up and steadying upon standing. Verbal cuing for hand placement.   Ambulation/Gait Ambulation/Gait assistance: Min guard Gait Distance (Feet): 25 Feet Assistive device: Rolling walker (2 wheeled) Gait Pattern/deviations: Step-to pattern;Decreased stride length;Antalgic;Decreased weight shift to right;Decreased stance time - right;Trunk flexed Gait velocity: decr    General Gait Details: Min guard for safety. Verbal cuing for sequencing, placement in RW, turning.   Stairs            Wheelchair Mobility    Modified Rankin (Stroke Patients Only)       Balance Overall balance assessment: Needs assistance Sitting-balance support: Single extremity supported Sitting balance-Leahy Scale: Fair Sitting balance - Comments: Pt with difficulty sitting EOB, pt with multidirectional balance disturbances. PT encouraged UE propping to support herself.      Standing balance-Leahy Scale: Poor Standing balance comment: relies on RW for steadying                              Pertinent Vitals/Pain Pain Assessment: 0-10 Pain Score: 6  Pain Location: R knee  Pain Descriptors / Indicators: Sore Pain Intervention(s): Limited activity within patient's tolerance;Repositioned;Monitored during session;Ice applied    Home Living Family/patient expects to be discharged to:: Private residence Living Arrangements: Spouse/significant other;Children Available Help at Discharge: Family;Available PRN/intermittently Type of Home: Apartment Home Access: Stairs to enter Entrance Stairs-Rails: Right Entrance Stairs-Number of Steps: 2 Home Layout: One level Home Equipment: Walker - 2 wheels;Bedside commode      Prior Function Level of Independence: Independent  Hand Dominance   Dominant Hand: Right    Extremity/Trunk Assessment   Upper Extremity Assessment Upper  Extremity Assessment: Overall WFL for tasks assessed    Lower Extremity Assessment Lower Extremity Assessment: Overall WFL for tasks assessed;RLE deficits/detail RLE Deficits / Details: suspected post-surgical weakness; able to perform quad set, ankle pumps. SLR not attempted due to pt's high pain level  RLE Sensation: WNL    Cervical / Trunk Assessment Cervical / Trunk Assessment: Normal  Communication   Communication: No difficulties  Cognition Arousal/Alertness: Awake/alert Behavior During Therapy: WFL for tasks assessed/performed Overall Cognitive Status: Within Functional Limits for tasks assessed                                        General Comments      Exercises Total Joint Exercises Ankle Circles/Pumps: AROM;Both;10 reps;Seated Goniometric ROM: knee AAROM approximately 5-45*, limited by pain    Assessment/Plan    PT Assessment Patient needs continued PT services  PT Problem List Decreased strength;Pain;Decreased range of motion;Decreased activity tolerance;Decreased knowledge of use of DME;Decreased balance;Decreased safety awareness;Decreased mobility       PT Treatment Interventions DME instruction;Gait training;Therapeutic activities;Therapeutic exercise;Patient/family education;Stair training;Balance training;Functional mobility training    PT Goals (Current goals can be found in the Care Plan section)  Acute Rehab PT Goals PT Goal Formulation: With patient Time For Goal Achievement: 05/16/18 Potential to Achieve Goals: Good    Frequency 7X/week   Barriers to discharge        Co-evaluation               AM-PAC PT "6 Clicks" Daily Activity  Outcome Measure Difficulty turning over in bed (including adjusting bedclothes, sheets and blankets)?: Unable Difficulty moving from lying on back to sitting on the side of the bed? : Unable Difficulty sitting down on and standing up from a chair with arms (e.g., wheelchair, bedside commode,  etc,.)?: Unable Help needed moving to and from a bed to chair (including a wheelchair)?: A Little Help needed walking in hospital room?: A Little Help needed climbing 3-5 steps with a railing? : A Little 6 Click Score: 12    End of Session Equipment Utilized During Treatment: Gait belt;Right knee immobilizer Activity Tolerance: Patient tolerated treatment well;Patient limited by pain Patient left: in chair;with chair alarm set;with call bell/phone within reach;with family/visitor present;with SCD's reapplied Nurse Communication: Mobility status PT Visit Diagnosis: Other abnormalities of gait and mobility (R26.89);Difficulty in walking, not elsewhere classified (R26.2)    Time: 9811-9147 PT Time Calculation (min) (ACUTE ONLY): 22 min   Charges:   PT Evaluation $PT Eval Low Complexity: 1 Low          Tristy Udovich Conception Chancy, PT Acute Rehabilitation Services Pager [phone]  Office [phone]   Roxine Caddy D Elonda Husky 05/09/2018, 6:20 PM

## 2018-05-10 LAB — CBC
HCT: 37.9 % (ref 36.0–46.0)
Hemoglobin: 11.7 g/dL — ABNORMAL LOW (ref 12.0–15.0)
MCH: 28.2 pg (ref 26.0–34.0)
MCHC: 30.9 g/dL (ref 30.0–36.0)
MCV: 91.3 fL (ref 80.0–100.0)
NRBC: 0 % (ref 0.0–0.2)
PLATELETS: 305 10*3/uL (ref 150–400)
RBC: 4.15 MIL/uL (ref 3.87–5.11)
RDW: 13.3 % (ref 11.5–15.5)
WBC: 11.7 10*3/uL — ABNORMAL HIGH (ref 4.0–10.5)

## 2018-05-10 NOTE — Plan of Care (Signed)
Pt stable with no needs. No changes to note or needed to care plans. No s/s of distress.

## 2018-05-10 NOTE — Progress Notes (Signed)
Physical Therapy Treatment Patient Details Name: [name] MRN: [id number] DOB: [birth year] Today's Date: 05/10/2018    History of Present Illness 57 yo female s/p R TKR on 05/09/18. PMH includes OA, polysubstance abuse, depression, HLD, HTN, squamous cell carcinoma s/p resection 2011.     PT Comments    Pt limited in ambulation distance this session due to pain and fatigue. Pt requires repeated verbal cues for stair navigation, and needs continued practice before proficiency is achieved. Also, stairs practiced with cane this session, and pt may benefit from practicing with single axillary crutch. PT to continue to follow acutely.    Follow Up Recommendations  Follow surgeon's recommendation for DC plan and follow-up therapies;Supervision for mobility/OOB(HHPT )     Equipment Recommendations  None recommended by PT    Recommendations for Other Services       Precautions / Restrictions Precautions Precautions: Fall Required Braces or Orthoses: Knee Immobilizer - Right Knee Immobilizer - Right: On when out of bed or walking;Discontinue once straight leg raise with < 10 degree lag Restrictions Weight Bearing Restrictions: No Other Position/Activity Restrictions: WBAT     Mobility  Bed Mobility Overal bed mobility: Needs Assistance Bed Mobility: Supine to Sit;Sit to Supine     Supine to sit: HOB elevated;Min guard Sit to supine: Min guard   General bed mobility comments: Min guard for safety. Increased time.   Transfers Overall transfer level: Needs assistance Equipment used: Rolling walker (2 wheeled) Transfers: Sit to/from Stand Sit to Stand: From elevated surface;Min guard         General transfer comment: Min guard for safety. Increased effort and pt with self-steadying upon standing.   Ambulation/Gait Ambulation/Gait assistance: Min guard Gait Distance (Feet): 60 Feet Assistive device: Rolling walker (2 wheeled) Gait Pattern/deviations: Step-to  pattern;Decreased stride length;Antalgic;Decreased weight shift to right;Decreased stance time - right;Trunk flexed;Step-through pattern Gait velocity: decr    General Gait Details: Min guard for safety. Repeated verbal cuing for sequencing and trying to transition pt to step-through gait.    Stairs Stairs: Yes Stairs assistance: Min guard;Min assist Stair Management: One rail Right;With cane Number of Stairs: 4 General stair comments: min guard to min assist for safety and steadying. repeated verbal cuing for sequencing, pt got confused with ascending and descending sequencing.    Wheelchair Mobility    Modified Rankin (Stroke Patients Only)       Balance Overall balance assessment: Needs assistance Sitting-balance support: Single extremity supported Sitting balance-Leahy Scale: Good       Standing balance-Leahy Scale: Poor Standing balance comment: relies on RW for steadying                             Cognition Arousal/Alertness: Awake/alert Behavior During Therapy: WFL for tasks assessed/performed Overall Cognitive Status: Within Functional Limits for tasks assessed                                        Exercises Total Joint Exercises Ankle Circles/Pumps: AROM;Both;20 reps;Seated Quad Sets: AROM;Right;10 reps;Seated Short Arc Quad: AAROM;Right;10 reps;Supine Heel Slides: AROM;Right;10 reps;Seated Hip ABduction/ADduction: AROM;Right;10 reps;Supine Straight Leg Raises: AAROM;Right;5 reps;Supine Goniometric ROM: knee aarom `5-80*, limited by pain     General Comments        Pertinent Vitals/Pain Pain Assessment: 0-10 Pain Score: 5  Pain Location: R knee  Pain Descriptors / Indicators:  Sore;Aching Pain Intervention(s): Limited activity within patient's tolerance;Monitored during session    Home Living                      Prior Function            PT Goals (current goals can now be found in the care plan section)  Acute Rehab PT Goals PT Goal Formulation: With patient Time For Goal Achievement: 05/16/18 Potential to Achieve Goals: Good Progress towards PT goals: Progressing toward goals    Frequency    7X/week      PT Plan Current plan remains appropriate    Co-evaluation              AM-PAC PT "6 Clicks" Daily Activity  Outcome Measure  Difficulty turning over in bed (including adjusting bedclothes, sheets and blankets)?: Unable Difficulty moving from lying on back to sitting on the side of the bed? : Unable Difficulty sitting down on and standing up from a chair with arms (e.g., wheelchair, bedside commode, etc,.)?: Unable Help needed moving to and from a bed to chair (including a wheelchair)?: A Little Help needed walking in hospital room?: A Little Help needed climbing 3-5 steps with a railing? : A Little 6 Click Score: 12    End of Session Equipment Utilized During Treatment: Gait belt;Right knee immobilizer Activity Tolerance: Patient tolerated treatment well;Patient limited by pain Patient left: in chair;with chair alarm set;with call bell/phone within reach;with SCD's reapplied Nurse Communication: Mobility status PT Visit Diagnosis: Other abnormalities of gait and mobility (R26.89);Difficulty in walking, not elsewhere classified (R26.2)     Time: 5747-3403 PT Time Calculation (min) (ACUTE ONLY): 25 min  Charges:  $Gait Training: 8-22 mins $Therapeutic Activity: 8-22 mins                     Julien Girt, PT Acute Rehabilitation Services Pager [phone]  Office [place] 05/10/2018, 1:57 PM

## 2018-05-10 NOTE — Progress Notes (Signed)
Subjective: 1 Day Post-Op Procedure(s) (LRB): TOTAL KNEE ARTHROPLASTY (Right) Patient reports pain as moderate.    Objective: Vital signs in last 24 hours: Temp:  [97.3 F (36.3 C)-98.4 F (36.9 C)] 97.8 F (36.6 C) (11/09 0537) Pulse Rate:  [51-71] 66 (11/09 0542) Resp:  [12-20] 16 (11/09 0537) BP: (129-161)/(62-91) 152/75 (11/09 0537) SpO2:  [96 %-100 %] 99 % (11/09 0537) Weight:  [82.1 kg] 82.1 kg (11/08 1120)  Intake/Output from previous day: 11/08 0701 - 11/09 0700 In: 4202.6 [P.O.:480; I.V.:3026.9; IV Piggyback:695.7] Out: 2550 [Urine:2500; Blood:50] Intake/Output this shift: Total I/O In: 240 [P.O.:240] Out: 300 [Urine:300]  Recent Labs    05/10/18 0424  HGB 11.7*   Recent Labs    05/10/18 0424  WBC 11.7*  RBC 4.15  HCT 37.9  PLT 305   No results for input(s): NA, K, CL, CO2, BUN, CREATININE, GLUCOSE, CALCIUM in the last 72 hours. No results for input(s): LABPT, INR in the last 72 hours.  Neurologically intact ABD soft Neurovascular intact No cellulitis present Compartment soft Inability to do straight leg raise.  Active range of motion is 0 to 30 degrees.  Passively I can get her to 50 degrees.  Anticipated LOS equal to or greater than 2 midnights due to - Age 56 and older with one or more of the following:  - Obesity  - Expected need for hospital services (PT, OT, Nursing) required for safe  discharge  - Anticipated need for postoperative skilled nursing care or inpatient rehab  - Active co-morbidities: Poor home situation, and ability to do straight leg raise and limited range of motion and need for additional therapy, history of mental disorders. OR   - Unanticipated findings during/Post Surgery: Slow post-op progression: GI, pain control, mobility  - Patient is a high risk of re-admission due to: Barriers to post-acute care (logistical, no family support in home)   Assessment/Plan: 1 Day Post-Op Procedure(s) (LRB): TOTAL KNEE ARTHROPLASTY  (Right) Advance diet Up with therapy Discharge home with home health Tomorrow patient will need aggressive additional physical therapy today and hopefully will be able to be discharged tomorrow.    [name] 05/10/2018, 8:47 AM

## 2018-05-10 NOTE — Progress Notes (Signed)
Physical Therapy Treatment Patient Details Name: [name] MRN: [id number] DOB: 07/25/1960 Today's Date: 05/10/2018    History of Present Illness 57 yo female s/p R TKR on 05/09/18. PMH includes OA, polysubstance abuse, depression, HLD, HTN, squamous cell carcinoma s/p resection 2011.     PT Comments    Pt with continued antalgic gait, but progressed to step-through gait this session. Pt still requiring use of R knee immobilizer due to difficulty raising leg off bed for SLR. PT administered exercise handout to pt, will continue to review, demonstrate, and practice HEP. PT to visit pt for second session this afternoon to practice stair navigation and LE exercises. PT to continue to follow.    Follow Up Recommendations  Follow surgeon's recommendation for DC plan and follow-up therapies;Supervision for mobility/OOB(HHPT )     Equipment Recommendations  None recommended by PT    Recommendations for Other Services       Precautions / Restrictions Precautions Precautions: Fall Required Braces or Orthoses: Knee Immobilizer - Right Knee Immobilizer - Right: On when out of bed or walking;Discontinue once straight leg raise with < 10 degree lag Restrictions Weight Bearing Restrictions: No Other Position/Activity Restrictions: WBAT     Mobility  Bed Mobility Overal bed mobility: Needs Assistance Bed Mobility: Supine to Sit     Supine to sit: Min assist;HOB elevated     General bed mobility comments: Min assist for RLE management, scooting to EOB. Increased time and effort.   Transfers Overall transfer level: Needs assistance Equipment used: Rolling walker (2 wheeled) Transfers: Sit to/from Stand Sit to Stand: Min assist;From elevated surface         General transfer comment: Min assist for power up and steadying. Verbal cuing for hand placement.   Ambulation/Gait Ambulation/Gait assistance: Min guard Gait Distance (Feet): 100 Feet Assistive device: Rolling walker  (2 wheeled) Gait Pattern/deviations: Step-to pattern;Decreased stride length;Antalgic;Decreased weight shift to right;Decreased stance time - right;Trunk flexed;Step-through pattern Gait velocity: decr    General Gait Details: Min guard for safety. PT encouraging step-through gait this session. Some verbal cues for placement in RW.    Stairs             Wheelchair Mobility    Modified Rankin (Stroke Patients Only)       Balance Overall balance assessment: Needs assistance Sitting-balance support: Single extremity supported Sitting balance-Leahy Scale: Good       Standing balance-Leahy Scale: Poor Standing balance comment: relies on RW for steadying                             Cognition Arousal/Alertness: Awake/alert Behavior During Therapy: WFL for tasks assessed/performed Overall Cognitive Status: Within Functional Limits for tasks assessed                                        Exercises Total Joint Exercises Ankle Circles/Pumps: AROM;Both;20 reps;Seated Quad Sets: AROM;Right;10 reps;Seated Heel Slides: AAROM;Right;10 reps;Seated Goniometric ROM: knee aarom ~5-65*, limited by pain and stiffness    General Comments        Pertinent Vitals/Pain Pain Assessment: 0-10 Pain Score: 6  Pain Location: R knee  Pain Descriptors / Indicators: Sore;Aching Pain Intervention(s): Repositioned;Limited activity within patient's tolerance;Ice applied;Monitored during session    Home Living  Prior Function            PT Goals (current goals can now be found in the care plan section) Acute Rehab PT Goals PT Goal Formulation: With patient Time For Goal Achievement: 05/16/18 Potential to Achieve Goals: Good Progress towards PT goals: Progressing toward goals    Frequency    7X/week      PT Plan Current plan remains appropriate    Co-evaluation              AM-PAC PT "6 Clicks" Daily Activity   Outcome Measure  Difficulty turning over in bed (including adjusting bedclothes, sheets and blankets)?: Unable Difficulty moving from lying on back to sitting on the side of the bed? : Unable Difficulty sitting down on and standing up from a chair with arms (e.g., wheelchair, bedside commode, etc,.)?: Unable Help needed moving to and from a bed to chair (including a wheelchair)?: A Little Help needed walking in hospital room?: A Little Help needed climbing 3-5 steps with a railing? : A Little 6 Click Score: 12    End of Session Equipment Utilized During Treatment: Gait belt;Right knee immobilizer Activity Tolerance: Patient tolerated treatment well;Patient limited by pain Patient left: in chair;with chair alarm set;with call bell/phone within reach;with SCD's reapplied Nurse Communication: Mobility status PT Visit Diagnosis: Other abnormalities of gait and mobility (R26.89);Difficulty in walking, not elsewhere classified (R26.2)     Time: 6811-5726 PT Time Calculation (min) (ACUTE ONLY): 23 min  Charges:  $Gait Training: 8-22 mins $Therapeutic Exercise: 8-22 mins                    Julien Girt, PT Acute Rehabilitation Services Pager [phone]  Office [phone]  Jeorge Reister D [place] 05/10/2018, 11:29 AM

## 2018-05-11 LAB — CBC
HEMATOCRIT: 35.3 % — AB (ref 36.0–46.0)
Hemoglobin: 11.2 g/dL — ABNORMAL LOW (ref 12.0–15.0)
MCH: 28.1 pg (ref 26.0–34.0)
MCHC: 31.7 g/dL (ref 30.0–36.0)
MCV: 88.5 fL (ref 80.0–100.0)
Platelets: 294 10*3/uL (ref 150–400)
RBC: 3.99 MIL/uL (ref 3.87–5.11)
RDW: 13.4 % (ref 11.5–15.5)
WBC: 11.6 10*3/uL — AB (ref 4.0–10.5)
nRBC: 0 % (ref 0.0–0.2)

## 2018-05-11 NOTE — Progress Notes (Signed)
Physical Therapy Treatment Patient Details Name: [name] MRN: [id number] DOB: Apr 16, 1961 Today's Date: 05/11/2018    History of Present Illness 57 yo female s/p R TKR on 05/09/18. PMH includes OA, polysubstance abuse, depression, HLD, HTN, squamous cell carcinoma s/p resection 2011.     PT Comments    Pt needing frequent cueing for safe stair technique.  Caregiver, Alvester Chou, was present today for stairs and he was instructed in proper technique.  She verbalized understanding to have someone with her on the stairs.   Follow Up Recommendations  Follow surgeon's recommendation for DC plan and follow-up therapies;Supervision for mobility/OOB     Equipment Recommendations  (issued crutch)    Recommendations for Other Services       Precautions / Restrictions Precautions Precautions: Fall Required Braces or Orthoses: Knee Immobilizer - Right Knee Immobilizer - Right: On when out of bed or walking;Discontinue once straight leg raise with < 10 degree lag Restrictions Weight Bearing Restrictions: No    Mobility  Bed Mobility Overal bed mobility: Needs Assistance Bed Mobility: Supine to Sit     Supine to sit: HOB elevated;Supervision        Transfers Overall transfer level: Needs assistance Equipment used: Rolling walker (2 wheeled) Transfers: Sit to/from Stand Sit to Stand: Modified independent (Device/Increase time)         General transfer comment: stood from toilet with MOD I  Ambulation/Gait Ambulation/Gait assistance: Min guard Gait Distance (Feet): 80 Feet Assistive device: Rolling walker (2 wheeled) Gait Pattern/deviations: Step-to pattern;Antalgic Gait velocity: decreased   General Gait Details: min/guard for safety   Stairs Stairs: Yes Stairs assistance: Min guard;Min assist Stair Management: One rail Left;With crutches;Step to pattern Number of Stairs: 4 General stair comments: repetitive cueing for proper sequencing.  caregiver, Alvester Chou,  present and was educated on how to assist with stairs and proper sequence.   Wheelchair Mobility    Modified Rankin (Stroke Patients Only)       Balance             Standing balance-Leahy Scale: Poor Standing balance comment: relies on RW for steadying                             Cognition Arousal/Alertness: Awake/alert Behavior During Therapy: WFL for tasks assessed/performed Overall Cognitive Status: Within Functional Limits for tasks assessed                                        Exercises Total Joint Exercises Ankle Circles/Pumps: AROM;Both;20 reps;Seated Quad Sets: AROM;Right;10 reps;Seated Heel Slides: AROM;Right;10 reps;Seated Goniometric ROM: AROM ~ 60 degrees with c/o pain    General Comments        Pertinent Vitals/Pain Pain Assessment: Faces Faces Pain Scale: Hurts even more Pain Location: R knee  Pain Descriptors / Indicators: Sore;Aching Pain Intervention(s): Limited activity within patient's tolerance;Monitored during session;Repositioned;Patient requesting pain meds-RN notified;Ice applied    Home Living                      Prior Function            PT Goals (current goals can now be found in the care plan section) Acute Rehab PT Goals Patient Stated Goal: home PT Goal Formulation: With patient/family Time For Goal Achievement: 05/16/18 Potential to Achieve Goals: Good Progress towards PT goals: Progressing  toward goals    Frequency    7X/week      PT Plan Current plan remains appropriate    Co-evaluation              AM-PAC PT "6 Clicks" Daily Activity  Outcome Measure  Difficulty turning over in bed (including adjusting bedclothes, sheets and blankets)?: A Little Difficulty moving from lying on back to sitting on the side of the bed? : A Little Difficulty sitting down on and standing up from a chair with arms (e.g., wheelchair, bedside commode, etc,.)?: A Little Help needed  moving to and from a bed to chair (including a wheelchair)?: A Little Help needed walking in hospital room?: A Little Help needed climbing 3-5 steps with a railing? : A Little 6 Click Score: 18    End of Session Equipment Utilized During Treatment: Gait belt Activity Tolerance: Patient tolerated treatment well;Patient limited by pain Patient left: in chair;with call bell/phone within reach;with chair alarm set;with family/visitor present Nurse Communication: Mobility status;Patient requests pain meds PT Visit Diagnosis: Other abnormalities of gait and mobility (R26.89);Difficulty in walking, not elsewhere classified (R26.2)     Time: 2951-8841 PT Time Calculation (min) (ACUTE ONLY): 34 min  Charges:  $Gait Training: 23-37 mins                     Bailey Kolbe L. Tamala Julian, [place] Pager [phone] 05/11/2018    Galen [place] 05/11/2018, 10:06 AM

## 2018-05-11 NOTE — Discharge Summary (Signed)
Patient ID: [name] MRN: [id number] DOB/AGE: 56-Mar-1962 56 y.o.  Admit date: 05/09/2018 Discharge date: 05/11/2018  Admission Diagnoses:  Principal Problem:   Primary osteoarthritis of right knee   Discharge Diagnoses:  Same  Past Medical History:  Diagnosis Date  . Cocaine abuse ([place])   . ETOH abuse   . Hypothyroidism   . Unspecified mood (affective) disorder ([facility])     Surgeries: Procedure(s): Right TOTAL KNEE ARTHROPLASTY on 05/09/2018   Discharged Condition: Improved  Hospital Course: [name] is an 57 y.o. female who was admitted 05/09/2018 for operative treatment ofPrimary osteoarthritis of right knee. Patient has severe unremitting pain that affects sleep, daily activities, and work/hobbies. After pre-op clearance the patient was taken to the operating room on 05/09/2018 and underwent  Procedure(s): Right TOTAL KNEE ARTHROPLASTY.    Patient was given perioperative antibiotics:  Anti-infectives (From admission, onward)   Start     Dose/Rate Route Frequency Ordered Stop   05/09/18 1400  ceFAZolin (ANCEF) IVPB 2g/100 mL premix     2 g 200 mL/hr over 30 Minutes Intravenous Every 6 hours 05/09/18 1126 05/09/18 2201   05/09/18 0600  ceFAZolin (ANCEF) IVPB 2g/100 mL premix     2 g 200 mL/hr over 30 Minutes Intravenous On call to O.R. 05/09/18 0532 05/09/18 0749       Patient was given sequential compression devices, early ambulation, and chemoprophylaxis to prevent DVT.  Patient benefited maximally from hospital stay and there were no complications.    Recent vital signs:  Patient Vitals for the past 24 hrs:  BP Temp Temp src Pulse Resp SpO2  05/11/18 0547 (!) 156/76 98.7 F (37.1 C) - 86 16 96 %  05/10/18 1348 (!) 151/75 98 F (36.7 C) Oral 87 14 95 %     Recent laboratory studies:  Recent Labs    05/10/18 0424 05/11/18 0417  WBC 11.7* 11.6*  HGB 11.7* 11.2*  HCT 37.9 35.3*  PLT 305 294     Discharge Medications:   Allergies as of  05/11/2018   No Known Allergies     Medication List    STOP taking these medications   diclofenac sodium 1 % Gel Commonly known as:  VOLTAREN   ibuprofen 200 MG tablet Commonly known as:  ADVIL,MOTRIN   ibuprofen 600 MG tablet Commonly known as:  ADVIL,MOTRIN   nicotine 21 mg/24hr patch Commonly known as:  NICODERM CQ - dosed in mg/24 hours   traZODone 100 MG tablet Commonly known as:  DESYREL     TAKE these medications   aspirin EC 325 MG tablet Take 1 tablet (325 mg total) by mouth 2 (two) times daily after a meal. Take x 1 month post op to decrease risk of blood clots.   citalopram 40 MG tablet Commonly known as:  CELEXA Take 1 tablet (40 mg total) by mouth daily. For depression   docusate sodium 100 MG capsule Commonly known as:  COLACE Take 1 capsule (100 mg total) by mouth 2 (two) times daily.   hydrOXYzine 25 MG tablet Commonly known as:  ATARAX/VISTARIL Take 1 tablet (25 mg total) by mouth every 6 (six) hours as needed for anxiety.   levothyroxine 75 MCG tablet Commonly known as:  SYNTHROID, LEVOTHROID Take 1 tablet (75 mcg total) by mouth daily at 6 (six) AM. For low functioning thyroid   oxyCODONE-acetaminophen 5-325 MG tablet Commonly known as:  PERCOCET/ROXICET Take 1-2 tablets by mouth every 6 (six) hours as needed for severe pain.  risperiDONE 2 MG tablet Commonly known as:  RISPERDAL Take 1 tablet (2 mg total) by mouth at bedtime. For mood control   tiZANidine 2 MG tablet Commonly known as:  ZANAFLEX Take 1 tablet (2 mg total) by mouth every 8 (eight) hours as needed for muscle spasms.            Discharge Care Instructions  (From admission, onward)         Start     Ordered   05/11/18 0000  Weight bearing as tolerated    Question Answer Comment  Laterality right   Extremity Lower      05/11/18 1136          Diagnostic Studies: Dg Chest 2 View  Result Date: 04/29/2018 CLINICAL DATA:  Preop knee replacement. EXAM: CHEST -  2 VIEW COMPARISON:  No prior. FINDINGS: Mediastinum and hilar structures are normal. Lungs are clear. No pleural effusion or pneumothorax. Heart size normal. Degenerative changes thoracic spine. Degenerative changes both shoulders. IMPRESSION: No acute cardiopulmonary disease. Electronically Signed   By: Marcello Moores  Register   On: 04/29/2018 07:44    Disposition: Discharge disposition: 01-Home or Self Care       Discharge Instructions    Call MD / Call 911   Complete by:  As directed    If you experience chest pain or shortness of breath, CALL 911 and be transported to the hospital emergency room.  If you develope a fever above 101 F, pus (white drainage) or increased drainage or redness at the wound, or calf pain, call your surgeon's office.   Constipation Prevention   Complete by:  As directed    Drink plenty of fluids.  Prune juice may be helpful.  You may use a stool softener, such as Colace (over the counter) 100 mg twice a day.  Use MiraLax (over the counter) for constipation as needed.   Diet general   Complete by:  As directed    Do not put a pillow under the knee. Place it under the heel.   Complete by:  As directed    Face-to-face encounter (required for Medicare/Medicaid patients)   Complete by:  As directed    I Erlene Senters certify that this patient is under my care and that I, or a nurse practitioner or physician's assistant working with me, had a face-to-face encounter that meets the physician face-to-face encounter requirements with this patient on 05/11/2018. The encounter with the patient was in whole, or in part for the following medical condition(s) which is the primary reason for home health care (List medical condition): Status post right total knee replacement   The encounter with the patient was in whole, or in part, for the following medical condition, which is the primary reason for home health care:  Status post right total knee replacement   I certify that, based  on my findings, the following services are medically necessary home health services:  Physical therapy   Reason for Medically Necessary Home Health Services:   Therapy- Personnel officer, Public librarian Therapy- Therapeutic Exercises to Increase Strength and Endurance     My clinical findings support the need for the above services:   Pain interferes with ambulation/mobility Unable to leave home safely without assistance and/or assistive device     Further, I certify that my clinical findings support that this patient is homebound due to:   Pain interferes with ambulation/mobility Unable to leave home safely without assistance  Home Health   Complete by:  As directed    To provide the following care/treatments:  PT   Increase activity slowly as tolerated   Complete by:  As directed    Weight bearing as tolerated   Complete by:  As directed    Laterality:  right   Extremity:  Lower      Follow-up Information    Dorna Leitz, MD. Schedule an appointment as soon as possible for a visit in 2 weeks.   Specialty:  Orthopedic Surgery Contact information: [place] [address] [phone]            Signed: Erlene Senters 05/11/2018, 11:37 AM

## 2018-05-11 NOTE — Care Management Note (Signed)
Case Management Note  Patient Details  Name: [name] MRN: [id number] Date of Birth: 12/06/1960  Subjective/Objective:      Right TKA              Action/Plan: NCM spoke to pt and states she was arranged with [facility] for HHPT from surgeon's office. Pt has [facility] card and contact information. She has RW and 3n1 bedside commode at home. Her boyfriend, Alvester Chou will assist her at home.   Expected Discharge Date:  05/11/18               Expected Discharge Plan:  [place]  In-House Referral:  NA  Discharge planning Services  CM Consult  Post Acute Care Choice:  Home Health Choice offered to:  Patient  DME Arranged:  N/A DME Agency:  NA  HH Arranged:  PT HH Agency:  [facility]  Status of Service:  Completed, signed off  If discussed at [place] of Stay Meetings, dates discussed:    Additional Comments:  Erenest Rasher, RN 05/11/2018, 12:51 PM

## 2018-05-11 NOTE — Progress Notes (Signed)
Subjective: 2 Days Post-Op Procedure(s) (LRB): TOTAL KNEE ARTHROPLASTY (Right) Patient reports pain as moderate.  Taking by mouth and voiding okay.  She is progressing with physical therapy.  She reports she is ready to go home.  Objective: Vital signs in last 24 hours: Temp:  [98 F (36.7 C)-98.7 F (37.1 C)] 98.7 F (37.1 C) (11/10 0547) Pulse Rate:  [86-87] 86 (11/10 0547) Resp:  [14-16] 16 (11/10 0547) BP: (151-156)/(75-76) 156/76 (11/10 0547) SpO2:  [95 %-96 %] 96 % (11/10 0547)  Intake/Output from previous day: 11/09 0701 - 11/10 0700 In: 1485.1 [P.O.:1320; I.V.:165.1] Out: 1500 [Urine:1500] Intake/Output this shift: Total I/O In: -  Out: 350 [Urine:350]  Recent Labs    05/10/18 0424 05/11/18 0417  HGB 11.7* 11.2*   Recent Labs    05/10/18 0424 05/11/18 0417  WBC 11.7* 11.6*  RBC 4.15 3.99  HCT 37.9 35.3*  PLT 305 294   No results for input(s): NA, K, CL, CO2, BUN, CREATININE, GLUCOSE, CALCIUM in the last 72 hours. No results for input(s): LABPT, INR in the last 72 hours. Right knee exam: Neurovascular intact Sensation intact distally Intact pulses distally Dorsiflexion/Plantar flexion intact Incision: dressing C/D/I Compartment soft  Anticipated LOS equal to or greater than 2 midnights due to - Age 56 and older with one or more of the following:  - Obesity  - Expected need for hospital services (PT, OT, Nursing) required for safe  discharge  - Anticipated need for postoperative skilled nursing care or inpatient rehab  - Active co-morbidities: Chronic pain requiring opiods OR   - Unanticipated findings during/Post Surgery: Slow post-op progression: GI, pain control, mobility  - Patient is a high risk of re-admission due to: None   Assessment/Plan: 2 Days Post-Op Procedure(s) (LRB): TOTAL KNEE ARTHROPLASTY (Right)  Plan: Weight-bear as tolerated on right. Aspirin 325 mg twice daily for DVT prophylaxis. Discharge home with home health   Follow-up with Dr. Berenice Primas in 2 weeks.    Erlene Senters 05/11/2018, 11:31 AM

## 2018-05-12 ENCOUNTER — Encounter (HOSPITAL_COMMUNITY): Payer: Self-pay | Admitting: Orthopedic Surgery

## 2018-05-14 LAB — BENZODIAZEPINES CONFIRM, URINE
ALPRAZOLAM: NEGATIVE
Benzodiazepines: POSITIVE ng/mL — AB
CLONAZEPAM: NEGATIVE
FLURAZEPAM: NEGATIVE
LORAZEPAM: NEGATIVE
MIDAZOLAM: POSITIVE — AB
Midazolam Conf.: 374 ng/mL
Nordiazepam: NEGATIVE
OXAZEPAM UR: NEGATIVE
TEMAZEPAM: NEGATIVE
Triazolam: NEGATIVE

## 2018-05-14 LAB — DRUG SCREEN (9) + CREATININE, UR
Amphetamine Screen, Urine: NEGATIVE ng/mL
Barbiturate, Ur: NEGATIVE ng/mL
COCAINE (METAB.), URINE: NEGATIVE ng/mL
CREATININE, RANDOM U: 141.5 mg/dL (ref 20.0–300.0)
METHADONE SCREEN, URINE: NEGATIVE ng/mL
OPIATE SCREEN URINE: NEGATIVE ng/mL
OXYCODONE+OXYMORPHONE UR QL SCN: NEGATIVE ng/mL
PCP, Urine: NEGATIVE ng/mL
Propoxyphene, Urine: NEGATIVE ng/mL
pH, Urine: 6.3 (ref 4.5–8.9)

## 2018-06-04 ENCOUNTER — Other Ambulatory Visit: Payer: Self-pay

## 2018-06-04 ENCOUNTER — Ambulatory Visit: Payer: Medicaid Other | Attending: Orthopedic Surgery | Admitting: Physical Therapy

## 2018-06-04 ENCOUNTER — Encounter: Payer: Self-pay | Admitting: Physical Therapy

## 2018-06-04 DIAGNOSIS — R2689 Other abnormalities of gait and mobility: Secondary | ICD-10-CM | POA: Diagnosis present

## 2018-06-04 DIAGNOSIS — M6281 Muscle weakness (generalized): Secondary | ICD-10-CM | POA: Insufficient documentation

## 2018-06-04 DIAGNOSIS — G8929 Other chronic pain: Secondary | ICD-10-CM | POA: Insufficient documentation

## 2018-06-04 DIAGNOSIS — R600 Localized edema: Secondary | ICD-10-CM | POA: Insufficient documentation

## 2018-06-04 DIAGNOSIS — M25561 Pain in right knee: Secondary | ICD-10-CM | POA: Insufficient documentation

## 2018-06-04 DIAGNOSIS — M25661 Stiffness of right knee, not elsewhere classified: Secondary | ICD-10-CM | POA: Insufficient documentation

## 2018-06-04 NOTE — Therapy (Signed)
[place], [place], [address] Phone: [phone]   Fax:  [phone]  Physical Therapy Evaluation  Patient Details  Name: [name] MRN: [id number] Date of Birth: 11/14/1960 Referring Provider (PT): Rosiland Oz graves MD   Encounter Date: 06/04/2018  PT End of Session - 06/04/18 1057    Visit Number  1    Number of Visits  17    Date for PT Re-Evaluation  07/30/18    Authorization Type  MCD (resubmit at 4th visit)    PT Start Time  0931    PT Stop Time  1010    PT Time Calculation (min)  39 min    Activity Tolerance  Patient tolerated treatment well    Behavior During Therapy  [facility] for tasks assessed/performed       Past Medical History:  Diagnosis Date  . Cocaine abuse ([place])   . ETOH abuse   . Hypothyroidism   . Unspecified mood (affective) disorder [facility])     Past Surgical History:  Procedure Laterality Date  . radioactive iodine ablation     graves disease s/p  . TOTAL KNEE ARTHROPLASTY Right 05/09/2018   Procedure: TOTAL KNEE ARTHROPLASTY;  Surgeon: Dorna Leitz, MD;  Location: [facility];  Service: Orthopedics;  Laterality: Right;    There were no vitals filed for this visit.   Subjective Assessment - 06/04/18 0936    Subjective  pt is a 57 y.o F S/P R T TKA on 05/09/2018. She reported having 3 visits of HHPT. reports since surgery the pain and swelling has calmed down. pt denies any N/T, pain stays mostly in the front of the knee.     Limitations  Standing;Walking    How long can you sit comfortably?  10 min     How long can you stand comfortably?  10 min     How long can you walk comfortably?  10 min     Diagnostic tests  at MD's office for follow up    Patient Stated Goals  get back to walking, decrease pain,     Currently in Pain?  Yes    Pain Score  6    at worst 10/10, last took ibuprofen 6am   Pain Location  Knee    Pain Orientation  Right;Anterior    Pain Type  Surgical pain     Pain Onset  More than a month ago    Pain Frequency  Intermittent    Aggravating Factors   laying down, bending the knee, prolonged standing/ walking,     Pain Relieving Factors  ibuprofen, ice PRN    Effect of Pain on Daily Activities  limited standing/ walking endurance         [facility] PT Assessment - 06/04/18 0941      Assessment   Medical Diagnosis  R TKA    Referring Provider (PT)  Rosiland Oz graves MD    Onset Date/Surgical Date  05/09/18    Hand Dominance  Right    Next MD Visit  06/26/2018    Prior Therapy  HHPT for the knee      Precautions   Precautions  None      Restrictions   Weight Bearing Restrictions  No      Balance Screen   Has the patient fallen in the past 6 months  No    Has the patient had a decrease in activity level because of a fear of falling?  No    Is the patient reluctant to leave their home because of a fear of falling?   No      Home Environment   Living Environment  Private residence    Living Arrangements  Children;Spouse/significant other    Available Help at Discharge  Family;Available PRN/intermittently    Type of Home  Apartment    Home Access  Stairs to enter    Entrance Stairs-Number of Steps  4    Entrance Stairs-Rails  Right   ascending   Home Layout  One level    Home Equipment  Crutches;Bedside commode;Grab bars - tub/shower   uses one crutch     Prior Function   Level of Independence  Independent    Vocation  On disability      Cognition   Overall Cognitive Status  Within Functional Limits for tasks assessed      Observation/Other Assessments   Lower Extremity Functional Scale   49/80      Posture/Postural Control   Posture/Postural Control  Postural limitations      ROM / Strength   AROM / PROM / Strength  AROM;PROM;Strength      AROM   AROM Assessment Site  Knee    Right/Left Knee  Right;Left    Right Knee Extension  -15    Right Knee Flexion  110    Left Knee Extension  -10    Left Knee Flexion  105       PROM   PROM Assessment Site  Knee    Right/Left Knee  Right    Right Knee Extension  -12    Right Knee Flexion  120      Strength   Strength Assessment Site  Knee    Right/Left Knee  Right;Left    Right Knee Flexion  3+/5   in available ROM    Right Knee Extension  3+/5   in available ROM    Left Knee Flexion  4+/5    Left Knee Extension  4+/5      Palpation   Palpation comment  TTP peri-patellar and along medial/ lateral joint line, as well as along distal semi-tendionosus      Ambulation/Gait   Ambulation/Gait  Yes    Assistive device  L Axillary Crutch    Gait Pattern  Decreased stride length;Antalgic;Trendelenburg;Trunk flexed;Narrow base of support;Right flexed knee in stance                Objective measurements completed on examination: See above findings.      [place] Adult PT Treatment/Exercise - 06/04/18 0941      Exercises   Exercises  Knee/Hip      Knee/Hip Exercises: Stretches   Active Hamstring Stretch  2 reps;30 seconds;Right      Knee/Hip Exercises: Standing   Gait Training  proper use of axillary crutch under L arm to promote R knee support and faciliate efficient gait      Knee/Hip Exercises: Supine   Quad Sets  1 set;10 reps;Right;Strengthening   holding 5 seconds   Heel Slides  1 set;10 reps;Right             PT Education - 06/04/18 1014    Education Details  evaluation findings, POC, goals, HEP with proper form. proper use of DME to promote functional gait    Person(s) Educated  Patient    Methods  Explanation;Verbal cues;Handout    Comprehension  Verbalized understanding;Verbal cues required  PT Short Term Goals - 06/04/18 1103      PT SHORT TERM GOAL #1   Title  pt to be I with inital HEp     Baseline  only HEP from HHPT    Time  3    Period  Weeks    Status  New    Target Date  06/25/18      PT SHORT TERM GOAL #2   Title  pt to verbalize/ demo techniques to reduce pain and swelling via RICE and HEP      Baseline  only takes medication for pain     Time  3    Period  Weeks    Status  New    Target Date  06/25/18      PT SHORT TERM GOAL #3   Title  increase R knee extension by >/= 5 degrees for therapuetic progression     Baseline  extension -15 degrees     Time  3    Period  Weeks    Status  New    Target Date  06/25/18        PT Long Term Goals - 06/04/18 1105      PT LONG TERM GOAL #1   Title  increase R knee ROM to >/= 3 to 120 for functional ROM required for functional and efficent gait pattern    Baseline  inital measures -15 to 110    Time  8    Period  Weeks    Status  New    Target Date  07/30/18      PT LONG TERM GOAL #2   Title  increase strength to >/= 4+/5 in the R knee to promote stability with walking/ standing     Baseline  3+/5 in available ROM    Time  8    Period  Weeks    Status  New    Target Date  07/30/18      PT LONG TERM GOAL #3   Title  increase walking/ standing to >/= 45 min and navigate up/down 10 steps for functional endurance/ mobility required for community ambulation with LRAD     Baseline  able to walk/ stand 10 min     Time  8    Period  Weeks    Status  New    Target Date  07/30/18      PT LONG TERM GOAL #4   Title  increase LEFS by >/= 15 to demonstarte improvement in function     Baseline  inital score 49/80    Time  8    Period  Weeks    Status  New    Target Date  07/30/18      PT LONG TERM GOAL #5   Title  pt to be I with all HEP given as of last visit to maintain and progress current level of function     Baseline  no previous HEP besides what was given at [facility]    Time  8    Period  Weeks    Status  New    Target Date  07/30/18             Plan - 06/04/18 1058    Clinical Impression Statement  pt presents to OPPT s/p R TKA on 05/09/2018. she demonstrates limited ROM and strength as expected following a TKA with associated localized edema. She currently uses 1 crutch for ambulation exhibiting and antalgic/  trendelenberg  pattern. She would benefit from physical therapy to decrease R knee pain, improve mobility/ strength, maximize gait effiency with LRAD and maximize function by addressing the deficits listed below.     Clinical Presentation  Stable    Clinical Decision Making  Low    Rehab Potential  Good    PT Frequency  2x / week    PT Duration  6 weeks   inital MCD auth 3 visits    PT Treatment/Interventions  ADLs/Self Care Home Management;Cryotherapy;Electrical Stimulation;Iontophoresis 4mg /ml Dexamethasone;Moist Heat;Gait training;Therapeutic activities;Therapeutic exercise;Manual techniques;Vasopneumatic Device;Taping;Passive range of motion;Patient/family education;Balance training    PT Next Visit Plan  review/ update HEP, bike,  ROM, stretching, gait training, vaso for edema    PT Home Exercise Plan  quad set, hamstring stretch, heel slide, standing heel raises    Consulted and Agree with Plan of Care  Patient       Patient will benefit from skilled therapeutic intervention in order to improve the following deficits and impairments:  Abnormal gait, Increased edema, Pain, Postural dysfunction, Improper body mechanics, Decreased activity tolerance, Decreased endurance, Decreased strength, Difficulty walking, Decreased range of motion, Increased fascial restricitons, Decreased knowledge of use of DME  Visit Diagnosis: Chronic pain of right knee  Stiffness of right knee, not elsewhere classified  Muscle weakness (generalized)  Other abnormalities of gait and mobility  Localized edema     Problem List Patient Active Problem List   Diagnosis Date Noted  . Primary osteoarthritis of right knee 05/09/2018  . Osteoarthritis of left knee 09/12/2017  . MDD (major depressive disorder), recurrent severe, without psychosis ([place]) 11/03/2016  . Alcohol-induced mood disorder with depressive symptoms ([place]) 10/26/2016  . Poor dentition 06/26/2016  . Essential hypertension 06/26/2016  .  Hyperlipidemia 06/26/2016  . Elevated transaminase level 08/29/2015  . Health care maintenance 12/31/2013  . Polysubstance abuse (h/o cocaine, ETOH, and tobacco abuse)    . Primary vulvar squamous cell carcinoma s/p resection 2011 02/13/2010  . 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 TOBACCO ABUSE 04/12/2006   [name] PT, DPT, LAT, ATC  06/04/18  11:18 AM      [facility] [address] [place], [place], [address] Phone: [phone]   Fax:  [phone]  Name: [name] MRN: [id number] Date of Birth: Aug 10, 1960

## 2018-06-09 ENCOUNTER — Encounter: Payer: Self-pay | Admitting: Physical Therapy

## 2018-06-13 ENCOUNTER — Other Ambulatory Visit: Payer: Self-pay | Admitting: Internal Medicine

## 2018-06-13 NOTE — Telephone Encounter (Signed)
Refill Request   oxyCODONE-acetaminophen (PERCOCET/ROXICET) 5-325 MG tablet  [facility] [place], [place] - [address]

## 2018-06-13 NOTE — Telephone Encounter (Signed)
Patient last seen in [facility] 09/12/2017. Last OV with PCP 02/14/2017. Patient had surgery 05/09/2018 and received 60 tabs oxycodone from that provider. Call placed to patient. No answer. Left message on VM requesting return call to discuss Rx request. Hubbard Hartshorn, RN, BSN

## 2018-06-18 ENCOUNTER — Other Ambulatory Visit: Payer: Self-pay | Admitting: Internal Medicine

## 2018-06-18 ENCOUNTER — Encounter: Payer: Self-pay | Admitting: Physical Therapy

## 2018-06-18 ENCOUNTER — Ambulatory Visit: Payer: Medicaid Other | Admitting: Physical Therapy

## 2018-06-18 DIAGNOSIS — E890 Postprocedural hypothyroidism: Secondary | ICD-10-CM

## 2018-06-18 DIAGNOSIS — R600 Localized edema: Secondary | ICD-10-CM

## 2018-06-18 DIAGNOSIS — M25561 Pain in right knee: Secondary | ICD-10-CM | POA: Diagnosis not present

## 2018-06-18 DIAGNOSIS — M25661 Stiffness of right knee, not elsewhere classified: Secondary | ICD-10-CM

## 2018-06-18 DIAGNOSIS — R2689 Other abnormalities of gait and mobility: Secondary | ICD-10-CM

## 2018-06-18 DIAGNOSIS — M6281 Muscle weakness (generalized): Secondary | ICD-10-CM

## 2018-06-18 DIAGNOSIS — G8929 Other chronic pain: Secondary | ICD-10-CM

## 2018-06-18 NOTE — Therapy (Signed)
[place], [place], [address] Phone: [phone]   Fax:  [phone]  Physical Therapy Treatment  Patient Details  Name: [name] MRN: [id number] Date of Birth: 16-May-1961 Referring Provider (PT): Rosiland Oz graves MD   Encounter Date: 06/18/2018  PT End of Session - 06/18/18 1059    Visit Number  2    Number of Visits  17    Authorization Type  MCD (resubmit at 4th visit)    PT Start Time  1116    PT Stop Time  1155    PT Time Calculation (min)  39 min    Activity Tolerance  Patient tolerated treatment well    Behavior During Therapy  [facility] for tasks assessed/performed       Past Medical History:  Diagnosis Date  . Cocaine abuse ([place])   . ETOH abuse   . Hypothyroidism   . Unspecified mood (affective) disorder [facility])     Past Surgical History:  Procedure Laterality Date  . radioactive iodine ablation     graves disease s/p  . TOTAL KNEE ARTHROPLASTY Right 05/09/2018   Procedure: TOTAL KNEE ARTHROPLASTY;  Surgeon: Dorna Leitz, MD;  Location: [facility];  Service: Orthopedics;  Laterality: Right;    There were no vitals filed for this visit.  Subjective Assessment - 06/18/18 1021    Subjective  No pain.  Does her HEP.  walks without the crutch inside.  Uses crutch in community.  exercises 1-2 x a  day for 30 minutes.  Able to walk 1 bock.     Currently in Pain?  No/denies    Pain Score  0-No pain   high pain no number given   Pain Location  Knee    Pain Orientation  Anterior   sharp,  numb   Pain Type  Surgical pain    Aggravating Factors   laying down, bending the knee , longer standing. walking    Pain Relieving Factors  rest,  medication,, ice,  rub    Effect of Pain on Daily Activities  limits standing walking ,  sleeping.          [facility] PT Assessment - 06/18/18 0001      AROM   AROM Assessment Site  Knee    Right/Left Knee  Right    Right Knee Extension  -14    Right Knee  Flexion  120                   [facility] Adult PT Treatment/Exercise - 06/18/18 0001      Ambulation/Gait   Ambulation/Gait  Yes    Assistive device  L Axillary Crutch    Pre-Gait Activities  weight shifting.      Gait Comments  cued technique      Knee/Hip Exercises: Aerobic   Recumbent Bike  3 minutes  full revolutions   fatigued     Knee/Hip Exercises: Standing   Other Standing Knee Exercises  wall slides 5 x 2 sets facing wall CGA  mod cues initially.        Knee/Hip Exercises: Seated   Long Arc Quad  Right;1 set;10 reps    Long Arc Quad Limitations  cued foot position    Sit to [employer]  5 reps   high mat cued initially     Knee/Hip Exercises: Supine   Quad Sets  1 set;10 reps;Right;Strengthening   holding 5 seconds.  trace quad contraction,  max cues  Short Arc Social worker Limitations  pink  foam roller under      Modalities   Modalities  Cryotherapy      Cryotherapy   Number Minutes Cryotherapy  0 Minutes    Cryotherapy Location  Knee   To go bag for home   Type of Cryotherapy  Ice pack      Manual Therapy   Manual Therapy  Soft tissue mobilization;Edema management    Manual therapy comments  Tibia rotations for knee extension,  ROM improves a little.      Edema Management  retrograde/ elecation .  Instructed how to     Soft tissue mobilization  tissue softened thigh,  patellar mobs             PT Education - 06/18/18 1058    Education Details  gait ex form  manual how to     [employer]) Educated  Patient    Methods  Explanation;Demonstration;Tactile cues;Verbal cues    Comprehension  Returned demonstration;Verbalized understanding;Need further instruction       PT Short Term Goals - 06/04/18 1103      PT SHORT TERM GOAL #1   Title  pt to be I with inital HEp     Baseline  only HEP from HHPT    Time  3    Period  Weeks    Status  New    Target Date  06/25/18      PT SHORT TERM  GOAL #2   Title  pt to verbalize/ demo techniques to reduce pain and swelling via RICE and HEP     Baseline  only takes medication for pain     Time  3    Period  Weeks    Status  New    Target Date  06/25/18      PT SHORT TERM GOAL #3   Title  increase R knee extension by >/= 5 degrees for therapuetic progression     Baseline  extension -15 degrees     Time  3    Period  Weeks    Status  New    Target Date  06/25/18        PT Long Term Goals - 06/04/18 1105      PT LONG TERM GOAL #1   Title  increase R knee ROM to >/= 3 to 120 for functional ROM required for functional and efficent gait pattern    Baseline  inital measures -15 to 110    Time  8    Period  Weeks    Status  New    Target Date  07/30/18      PT LONG TERM GOAL #2   Title  increase strength to >/= 4+/5 in the R knee to promote stability with walking/ standing     Baseline  3+/5 in available ROM    Time  8    Period  Weeks    Status  New    Target Date  07/30/18      PT LONG TERM GOAL #3   Title  increase walking/ standing to >/= 45 min and navigate up/down 10 steps for functional endurance/ mobility required for community ambulation with LRAD     Baseline  able to walk/ stand 10 min     Time  8    Period  Weeks    Status  New    Target Date  07/30/18      PT LONG TERM GOAL #4   Title  increase LEFS by >/= 15 to demonstarte improvement in function     Baseline  inital score 49/80    Time  8    Period  Weeks    Status  New    Target Date  07/30/18      PT LONG TERM GOAL #5   Title  pt to be I with all HEP given as of last visit to maintain and progress current level of function     Baseline  no previous HEP besides what was given at [facility]    Time  8    Period  Weeks    Status  New    Target Date  07/30/18            Plan - 06/18/18 1059    Clinical Impression Statement  -14 to 120 AROM right knee.  gait with less lateral trunk shift after instruction uses 1 crutch.  she will be ready for  cane soon.   Mild pain at end of session.     PT Next Visit Plan  review/ update HEP, bike,  ROM, stretching, gait training, vaso for edema( declined vaso today)    PT Home Exercise Plan  quad set, hamstring stretch, heel slide, standing heel raises    Consulted and Agree with Plan of Care  Patient       Patient will benefit from skilled therapeutic intervention in order to improve the following deficits and impairments:     Visit Diagnosis: Chronic pain of right knee  Stiffness of right knee, not elsewhere classified  Muscle weakness (generalized)  Other abnormalities of gait and mobility  Localized edema     Problem List Patient Active Problem List   Diagnosis Date Noted  . Primary osteoarthritis of right knee 05/09/2018  . Osteoarthritis of left knee 09/12/2017  . MDD (major depressive disorder), recurrent severe, without psychosis ([place]) 11/03/2016  . Alcohol-induced mood disorder with depressive symptoms ([place]) 10/26/2016  . Poor dentition 06/26/2016  . Essential hypertension 06/26/2016  . Hyperlipidemia 06/26/2016  . Elevated transaminase level 08/29/2015  . Health care maintenance 12/31/2013  . Polysubstance abuse (h/o cocaine, ETOH, and tobacco abuse)    . Primary vulvar squamous cell carcinoma s/p resection 2011 02/13/2010  . 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 TOBACCO ABUSE 04/12/2006    Pedram Goodchild PTA 06/18/2018, 11:02 AM  [facility] [address] [place], [place], [address] Phone: [phone]   Fax:  [phone]  Name: [name] MRN: [id number] Date of Birth: [birth year]

## 2018-06-19 ENCOUNTER — Other Ambulatory Visit: Payer: Self-pay

## 2018-06-19 NOTE — Telephone Encounter (Signed)
levothyroxine (SYNTHROID, LEVOTHROID) 75 MCG tablet, refill request @  [place], [place] - [address] [phone] (Phone) [phone] (Fax)

## 2018-06-19 NOTE — Telephone Encounter (Signed)
Called pt - informed Levothyroxine rx was refilled yesterday and sent to [facility]. Stated she will call the pharmacy.

## 2018-06-21 NOTE — Telephone Encounter (Signed)
Will not approve refill. I have never met patient and she is not on opiates chronically. Needs to be evaluated in clinic.

## 2018-06-23 ENCOUNTER — Ambulatory Visit: Payer: Medicaid Other | Admitting: Physical Therapy

## 2018-06-23 ENCOUNTER — Encounter: Payer: Self-pay | Admitting: Physical Therapy

## 2018-06-23 DIAGNOSIS — R600 Localized edema: Secondary | ICD-10-CM

## 2018-06-23 DIAGNOSIS — M25561 Pain in right knee: Principal | ICD-10-CM

## 2018-06-23 DIAGNOSIS — G8929 Other chronic pain: Secondary | ICD-10-CM

## 2018-06-23 DIAGNOSIS — M6281 Muscle weakness (generalized): Secondary | ICD-10-CM

## 2018-06-23 DIAGNOSIS — R2689 Other abnormalities of gait and mobility: Secondary | ICD-10-CM

## 2018-06-23 DIAGNOSIS — M25661 Stiffness of right knee, not elsewhere classified: Secondary | ICD-10-CM

## 2018-06-23 NOTE — Therapy (Signed)
[place], [place], [address] Phone: [phone]   Fax:  [phone]  Physical Therapy Treatment  Patient Details  Name: [name] MRN: [id number] Date of Birth: [birth year] Referring Provider (PT): Rosiland Oz graves MD   Encounter Date: 06/23/2018  PT End of Session - 06/23/18 1640    Visit Number  3    Number of Visits  17    Date for PT Re-Evaluation  07/30/18    Authorization Type  MCD (resubmit at 4th visit)    PT Start Time  1540    PT Stop Time  1630    PT Time Calculation (min)  50 min    Activity Tolerance  Patient tolerated treatment well    Behavior During Therapy  [facility] for tasks assessed/performed       Past Medical History:  Diagnosis Date  . Cocaine abuse ([place])   . ETOH abuse   . Hypothyroidism   . Unspecified mood (affective) disorder [facility])     Past Surgical History:  Procedure Laterality Date  . radioactive iodine ablation     graves disease s/p  . TOTAL KNEE ARTHROPLASTY Right 05/09/2018   Procedure: TOTAL KNEE ARTHROPLASTY;  Surgeon: Dorna Leitz, MD;  Location: [facility];  Service: Orthopedics;  Laterality: Right;    There were no vitals filed for this visit.  Subjective Assessment - 06/23/18 1547    Subjective  Family sats she needs a cane.  She has been doing her exercises.  No crutch today. I have not needed ice because it has not been hurting.    Currently in Pain?  No/denies    Pain Location  Knee    Pain Orientation  Right;Anterior;Lateral    Pain Descriptors / Indicators  Numbness    Pain Type  Surgical pain    Pain Frequency  Constant    Aggravating Factors   nothing    Pain Relieving Factors  rest         [facility] PT Assessment - 06/23/18 0001      AROM   AROM Assessment Site  Knee    Right/Left Knee  Right    Right Knee Extension  -10      PROM   Right Knee Flexion  128                   [facility] Adult PT Treatment/Exercise - 06/23/18 0001       Ambulation/Gait   Ambulation/Gait  Yes    Stairs  Yes    Pre-Gait Activities  encouraged increased heel strike,  trunk rotation to decrease lateral trunk sway.     Gait Comments  able to use 2 rails and step over step technique  XZ 3 on 4 inch steps in PEDS gym after instructions.  deviations noted.  Eccentric control challanging.       Knee/Hip Exercises: Stretches   Audiological scientist Limitations  with manual distal glides off edge of mat concurrent with thomas stretch for hip flexor.    Gastroc Stretch  Both;3 reps;30 seconds    Gastroc Stretch Limitations  incline      Knee/Hip Exercises: Aerobic   Recumbent Bike  4.5 minutes prior to fatigue.       Knee/Hip Exercises: Standing   Heel Raises  Right;1 set;10 reps    Heel Raises Limitations  mod use of hands    Hip Flexion  10 reps  Hip Flexion Limitations  march high both    Lateral Step Up  Right;1 set;10 reps;Hand Hold: 2;Step Height: 4"    Forward Step Up  2 sets;Hand Hold: 2;Step Height: 4";10 reps    Forward Step Up Limitations  cued    Functional Squat  10 reps    Functional Squat Limitations  mod cues initially,  small motions with pain    Wall Squat Limitations  10 x difficult,  small motions    SLS  6 seconds best right    Other Standing Knee Exercises  in parallel bars heel walking 2 laps.  tip toe walking 2 laps ands side steps 2 laps cued for hip position.        Knee/Hip Exercises: Supine   Short Arc Quad Sets  Strengthening;Right;2 sets;10 reps    Short Arc Quad Sets Limitations  3, 7 LBS challanging    Heel Slides  1 set;10 reps;Right    Heel Slides Limitations  strap for over pressure by patient    Bridges  10 reps    Bridges Limitations  difficult initially unable    Patellar Mobs  checked    Other Supine Knee/Hip Exercises  clam with  green band  2 sets,  no pain      Manual Therapy   Manual therapy comments  Tibia rotations for knee extension,  ROM improves a little.       Edema Management  retrograde     Soft tissue mobilization  scar tissue mobs to distal scar gentle             PT Education - 06/23/18 1637    Education Details  gait exercise form    Person(s) Educated  Patient    Methods  Explanation;Demonstration;Tactile cues;Verbal cues    Comprehension  Returned demonstration;Verbalized understanding       PT Short Term Goals - 06/23/18 1638      PT SHORT TERM GOAL #1   Title  pt to be I with inital HEp     Baseline  does her HEP    Time  3    Period  Weeks    Status  On-going      PT SHORT TERM GOAL #2   Title  pt to verbalize/ demo techniques to reduce pain and swelling via RICE and HEP     Baseline  edema reducing.  She has not needed ice lately    Time  3    Period  Weeks    Status  On-going      PT SHORT TERM GOAL #3   Title  increase R knee extension by >/= 5 degrees for therapuetic progression     Baseline  extension -10 paot manual    Time  3    Period  Weeks    Status  On-going        PT Long Term Goals - 06/04/18 1105      PT LONG TERM GOAL #1   Title  increase R knee ROM to >/= 3 to 120 for functional ROM required for functional and efficent gait pattern    Baseline  inital measures -15 to 110    Time  8    Period  Weeks    Status  New    Target Date  07/30/18      PT LONG TERM GOAL #2   Title  increase strength to >/= 4+/5 in the R knee to promote stability with walking/ standing  Baseline  3+/5 in available ROM    Time  8    Period  Weeks    Status  New    Target Date  07/30/18      PT LONG TERM GOAL #3   Title  increase walking/ standing to >/= 45 min and navigate up/down 10 steps for functional endurance/ mobility required for community ambulation with LRAD     Baseline  able to walk/ stand 10 min     Time  8    Period  Weeks    Status  New    Target Date  07/30/18      PT LONG TERM GOAL #4   Title  increase LEFS by >/= 15 to demonstarte improvement in function     Baseline  inital  score 49/80    Time  8    Period  Weeks    Status  New    Target Date  07/30/18      PT LONG TERM GOAL #5   Title  pt to be I with all HEP given as of last visit to maintain and progress current level of function     Baseline  no previous HEP besides what was given at [place]    Time  8    Period  Weeks    Status  New    Target Date  07/30/18            Plan - 06/23/18 1642    Clinical Impression Statement  ROM continues to improve PROM extension improves  -10 degrees  extension.  She denies any pain.  She declined need for modalities.     PT Next Visit Plan  review/ update HEP, bike,  ROM, stretching, gait training, vaso for edema( declined vaso today)    PT Home Exercise Plan  quad set, hamstring stretch, heel slide, standing heel raises    Consulted and Agree with Plan of Care  Patient       Patient will benefit from skilled therapeutic intervention in order to improve the following deficits and impairments:     Visit Diagnosis: Chronic pain of right knee  Stiffness of right knee, not elsewhere classified  Muscle weakness (generalized)  Other abnormalities of gait and mobility  Localized edema     Problem List Patient Active Problem List   Diagnosis Date Noted  . Primary osteoarthritis of right knee 05/09/2018  . Osteoarthritis of left knee 09/12/2017  . MDD (major depressive disorder), recurrent severe, without psychosis ([place]) 11/03/2016  . Alcohol-induced mood disorder with depressive symptoms ([place]) 10/26/2016  . Poor dentition 06/26/2016  . Essential hypertension 06/26/2016  . Hyperlipidemia 06/26/2016  . Elevated transaminase level 08/29/2015  . Health care maintenance 12/31/2013  . Polysubstance abuse (h/o cocaine, ETOH, and tobacco abuse)    . Primary vulvar squamous cell carcinoma s/p resection 2011 02/13/2010  . 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 TOBACCO ABUSE 04/12/2006    Joslynne Klatt PTA 06/23/2018, 4:44 PM  [place], [place], [address] Phone: [phone]   Fax:  [phone]  Name: [name] MRN: [id number] Date of Birth: May 05, 1961

## 2018-06-26 ENCOUNTER — Ambulatory Visit: Payer: Medicaid Other | Admitting: Physical Therapy

## 2018-06-26 ENCOUNTER — Encounter: Payer: Self-pay | Admitting: Physical Therapy

## 2018-06-26 DIAGNOSIS — M6281 Muscle weakness (generalized): Secondary | ICD-10-CM

## 2018-06-26 DIAGNOSIS — M25561 Pain in right knee: Principal | ICD-10-CM

## 2018-06-26 DIAGNOSIS — G8929 Other chronic pain: Secondary | ICD-10-CM

## 2018-06-26 DIAGNOSIS — R2689 Other abnormalities of gait and mobility: Secondary | ICD-10-CM

## 2018-06-26 DIAGNOSIS — M25661 Stiffness of right knee, not elsewhere classified: Secondary | ICD-10-CM

## 2018-06-26 DIAGNOSIS — R600 Localized edema: Secondary | ICD-10-CM

## 2018-06-26 NOTE — Patient Instructions (Addendum)
Remove tape if irritatingKNEE: Extension, Long Arc Quads - Sitting    Raise leg until knee is straight. _10  reps per set, _1-2__ sets per day, _7__ days per week  Hold 5 seconds.  Copyright  VHI. All rights reserved.

## 2018-06-26 NOTE — Therapy (Addendum)
[place], [place], [address] Phone: [phone]   Fax:  [phone]  Physical Therapy Treatment  Patient Details  Name: [name] MRN: [id number] Date of Birth: [birth year] Referring Provider (PT): Rosiland Oz graves MD   Encounter Date: 06/26/2018  PT End of Session - 06/26/18 1240    Visit number 4   Date 07/01/2018   PT Start Time  1148    PT Stop Time  1241    PT Time Calculation (min)  53 min    Activity Tolerance  Patient tolerated treatment well    Behavior During Therapy  [facility] for tasks assessed/performed       Past Medical History:  Diagnosis Date  . Cocaine abuse ([place])   . ETOH abuse   . Hypothyroidism   . Unspecified mood (affective) disorder [facility])     Past Surgical History:  Procedure Laterality Date  . radioactive iodine ablation     graves disease s/p  . TOTAL KNEE ARTHROPLASTY Right 05/09/2018   Procedure: TOTAL KNEE ARTHROPLASTY;  Surgeon: Dorna Leitz, MD;  Location: [facility];  Service: Orthopedics;  Laterality: Right;    There were no vitals filed for this visit.  Subjective Assessment - 06/26/18 1159    Subjective  I am going to get the cane tomorrow.  I have been doing the exercises.   Not sleeping well yet,     Currently in Pain?  No/denies    Pain Location  Knee    Pain Orientation  Right    Pain Frequency  Intermittent    Aggravating Factors   at night pain    Pain Relieving Factors  ice,  rest    Effect of Pain on Daily Activities  limits standing walking and sleeping                       [facility] Adult PT Treatment/Exercise - 06/26/18 0001      Ambulation/Gait   Ambulation/Gait  Yes    Gait Comments  on level weight shift,  heel strike.  improves short durations.        Knee/Hip Exercises: Aerobic   Nustep  Nu step  l4 UE/LE      Knee/Hip Exercises: Standing   Heel Raises  Right;1 set;10 reps    Heel Raises Limitations  mod use of hands     Knee Flexion  AROM;Right;1 set;10 reps    Terminal Knee Extension Limitations  10 X green band    Forward Step Up  2 sets;Hand Hold: 2;Step Height: 4";10 reps    Forward Step Up Limitations  cued    SLS  6 seconds best right    Other Standing Knee Exercises  wall slides facing wall 10 X eash side      Knee/Hip Exercises: Seated   Long Arc Quad  Right;1 set;10 reps    Stool Scoot - Round Trips  25 feet  X 2,  both feet     Hamstring Curl  Right;1 set;10 reps;Strengthening    Hamstring Limitations  green band      Knee/Hip Exercises: Supine   Quad Sets  1 set;10 reps    Heel Slides  1 set;10 reps;Right    Heel Slides Limitations  needed support under knee    Patellar Mobs  checked      Modalities   Modalities  Cryotherapy;Moist Heat      Moist Heat Therapy   Number Minutes Moist  Heat  10 Minutes    Moist Heat Location  Knee   posterior, concurrent with cold pack.       Cryotherapy   Number Minutes Cryotherapy  10 Minutes    Cryotherapy Location  Knee    Type of Cryotherapy  --   cold pack anterior knee,  moist heat posterior knee  at same     Manual Therapy   Manual therapy comments  Tibia rotations for knee extension,  ROM improves a little.      Edema Management  retrograde soft tissue   Passive stretching into extension gentle with patellar glide   Kinesiotex  Edema      Kinesiotix   Edema  medial lateral fane knee leg ,  edema visably reduced.               PT Education - 06/26/18 1233    Education Details  gait,  how tape works.      Person(s) Educated  Patient    Methods  Explanation;Demonstration;Tactile cues;Verbal cues    Comprehension  Returned demonstration;Verbalized understanding       PT Short Term Goals - 06/26/18 1253      PT SHORT TERM GOAL #1   Title  pt to be I with inital HEp     Baseline  does her HEP    Time  3    Period  Weeks    Status  Achieved      PT SHORT TERM GOAL #2   Title  pt to verbalize/ demo techniques to reduce pain  and swelling via RICE and HEP     Baseline  edema continues to vary.  she is not elevating at home,  education continues    Time  3    Period  Weeks    Status  On-going      PT SHORT TERM GOAL #3   Title  increase R knee extension by >/= 5 degrees for therapuetic progression     Baseline  extension -10 post manual    Time  3    Period  Weeks    Status  On-going        PT Long Term Goals - 06/04/18 1105      PT LONG TERM GOAL #1   Title  increase R knee ROM to >/= 3 to 120 for functional ROM required for functional and efficent gait pattern    Baseline  inital measures -15 to 110    Time  8    Period  Weeks    Status  New    Target Date  07/30/18      PT LONG TERM GOAL #2   Title  increase strength to >/= 4+/5 in the R knee to promote stability with walking/ standing     Baseline  3+/5 in available ROM    Time  8    Period  Weeks    Status  New    Target Date  07/30/18      PT LONG TERM GOAL #3   Title  increase walking/ standing to >/= 45 min and navigate up/down 10 steps for functional endurance/ mobility required for community ambulation with LRAD     Baseline  able to walk/ stand 10 min     Time  8    Period  Weeks    Status  New    Target Date  07/30/18      PT LONG TERM GOAL #4   Title  increase LEFS by >/= 15 to demonstarte improvement in function     Baseline  inital score 49/80    Time  8    Period  Weeks    Status  New    Target Date  07/30/18      PT LONG TERM GOAL #5   Title  pt to be I with all HEP given as of last visit to maintain and progress current level of function     Baseline  no previous HEP besides what was given at [place]    Time  [place]    Target Date  07/30/18            Plan - 06/26/18 1253    Consulted and Agree with Plan of Care  Patient      -10 extension continues after manual.  ,  Gait improves for short periods after instruction,  endurance improving with less frequent rests needed.    Patient will benefit from skilled therapeutic intervention in order to improve the following deficits and impairments:     Visit Diagnosis: Chronic pain of right knee  Stiffness of right knee, not elsewhere classified  Muscle weakness (generalized)  Other abnormalities of gait and mobility  Localized edema     Problem List Patient Active Problem List   Diagnosis Date Noted  . Primary osteoarthritis of right knee 05/09/2018  . Osteoarthritis of left knee 09/12/2017  . MDD (major depressive disorder), recurrent severe, without psychosis ([place]) 11/03/2016  . Alcohol-induced mood disorder with depressive symptoms ([place]) 10/26/2016  . Poor dentition 06/26/2016  . Essential hypertension 06/26/2016  . Hyperlipidemia 06/26/2016  . Elevated transaminase level 08/29/2015  . Health care maintenance 12/31/2013  . Polysubstance abuse (h/o cocaine, ETOH, and tobacco abuse)    . Primary vulvar squamous cell carcinoma s/p resection 2011 02/13/2010  . 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 TOBACCO ABUSE 04/12/2006    Itzayana Pardy  PTA 06/26/2018, 12:56 PM  [facility] [address] [place], [place], [address] Phone: [phone]   Fax:  [phone]  Name: NERA HAWORTH MRN: [id number] Date of Birth: 1961/05/27

## 2018-06-30 ENCOUNTER — Ambulatory Visit: Payer: Medicaid Other | Admitting: Physical Therapy

## 2018-06-30 ENCOUNTER — Encounter: Payer: Self-pay | Admitting: Physical Therapy

## 2018-06-30 DIAGNOSIS — M25561 Pain in right knee: Principal | ICD-10-CM

## 2018-06-30 DIAGNOSIS — M6281 Muscle weakness (generalized): Secondary | ICD-10-CM

## 2018-06-30 DIAGNOSIS — M25661 Stiffness of right knee, not elsewhere classified: Secondary | ICD-10-CM

## 2018-06-30 DIAGNOSIS — G8929 Other chronic pain: Secondary | ICD-10-CM

## 2018-06-30 DIAGNOSIS — R600 Localized edema: Secondary | ICD-10-CM

## 2018-06-30 DIAGNOSIS — R2689 Other abnormalities of gait and mobility: Secondary | ICD-10-CM

## 2018-06-30 NOTE — Therapy (Signed)
[place], [place], [address] Phone: [phone]   Fax:  [phone]  Physical Therapy Treatment / Re-evaluation  Patient Details  Name: [name] MRN: [id number] Date of Birth: 31-May-1961 Referring Provider (PT): Rosiland Oz graves MD   Encounter Date: 06/30/2018  PT End of Session - 06/30/18 1143    Visit Number  5    Number of Visits  17    Date for PT Re-Evaluation  07/30/18    Authorization Type  MCD (resubmit at 4th visit)    PT Start Time  1143    PT Stop Time  1215    PT Time Calculation (min)  32 min    Activity Tolerance  Patient tolerated treatment well    Behavior During Therapy  [facility] for tasks assessed/performed       Past Medical History:  Diagnosis Date  . Cocaine abuse ([place])   . ETOH abuse   . Hypothyroidism   . Unspecified mood (affective) disorder [facility])     Past Surgical History:  Procedure Laterality Date  . radioactive iodine ablation     graves disease s/p  . TOTAL KNEE ARTHROPLASTY Right 05/09/2018   Procedure: TOTAL KNEE ARTHROPLASTY;  Surgeon: Dorna Leitz, MD;  [facility];  Service: Orthopedics;  Laterality: Right;    There were no vitals filed for this visit.  Subjective Assessment - 06/30/18 1147    Subjective  I've been doing okay, still some numbness in the knee    Patient Stated Goals  get back to walking, decrease pain,     Currently in Pain?  No/denies    Pain Score  0-No pain    Aggravating Factors   N/A         [facility] PT Assessment - 06/30/18 0001      AROM   Right Knee Extension  -12    Right Knee Flexion  118                   [facility] Adult PT Treatment/Exercise - 06/30/18 0001      Knee/Hip Exercises: Stretches   Active Hamstring Stretch  2 reps;30 seconds      Knee/Hip Exercises: Standing   Forward Step Up  2 sets;Hand Hold: 2;Step Height: 4";10 reps    Gait Training  using SPC focusing on exaggerated heel strike/ toe  off 4 x 50 ft   frequent cues need for proper form      Knee/Hip Exercises: Seated   Long Arc Quad  2 sets;10 reps;Strengthening    Long Arc Quad Weight  3 lbs.      Knee/Hip Exercises: Supine   Straight Leg Raises  2 sets;10 reps;Right;Strengthening   quad lag of 18 degrees during SLR     Manual Therapy   Manual Therapy  Joint mobilization    Manual therapy comments  external tibial rotation with extension to activate screw home mechanism assisting with extension 2 x 10    Joint Mobilization  superior patellar mobs grade 3, PA grade              PT Education - 06/30/18 1226    Education Details  heel strike/ toe off gait training and to perofrm at home with SPC in the LUE to support the RLE. reviewed previously provided HEP for proper form/ rationale.     Person(s) Educated  Patient    Methods  Explanation;Verbal cues;Demonstration    Comprehension  Verbalized understanding;Verbal cues  required;Returned demonstration       PT Short Term Goals - 06/30/18 1206      PT SHORT TERM GOAL #1   Title  pt to be I with inital HEp     Time  3    Status  Achieved      PT SHORT TERM GOAL #2   Title  pt to verbalize/ demo techniques to reduce pain and swelling via RICE and HEP     Period  Weeks    Status  Achieved      PT SHORT TERM GOAL #3   Title  increase R knee extension by >/= 5 degrees for therapuetic progression     Baseline  extension -12 degrees (pt reports she has been sleeping with a pillow beneath her knee)    Status  On-going    Target Date  07/28/18        PT Long Term Goals - 06/30/18 1219      PT LONG TERM GOAL #1   Title  increase R knee ROM to >/= 3 to 120 for functional ROM required for functional and efficent gait pattern    Baseline  -12 to 118 degrees today     Time  8    Period  Weeks    Status  On-going    Target Date  07/30/18      PT LONG TERM GOAL #2   Title  increase strength to >/= 4+/5 in the R knee to promote stability with walking/  standing     Baseline  3+/5 in available ROM    Time  8    Period  Weeks    Status  On-going    Target Date  07/30/18      PT LONG TERM GOAL #3   Title  increase walking/ standing to >/= 45 min and navigate up/down 10 steps for functional endurance/ mobility required for community ambulation with LRAD     Baseline  able to walk/ stand 10 min with SPC     Time  8    Period  Weeks    Status  On-going    Target Date  07/30/18      PT LONG TERM GOAL #4   Title  increase LEFS by >/= 15 to demonstarte improvement in function     Baseline  inital score 49/80    Time  8    Period  Weeks    Status  On-going    Target Date  07/30/18      PT LONG TERM GOAL #5   Title  pt to be I with all HEP given as of last visit to maintain and progress current level of function     Baseline  independent with current HEP and progressing as able.     Time  8    Period  Weeks    Status  On-going    Target Date  07/30/18            Plan - 06/30/18 1221    Clinical Impression Statement  pt reports consistency with her HEP but continues to demonstrate limited knee mobility with -12 degrees to 118 with tightness noted along the hamstrings. Focused on ROM and strengthening today as well as gait training with SPC.  she met all Short term goals except for STG # 3 which may be due to being non-compliant with sleeping using pillow under her knee. she is making progress with LTG's appropriatley. She would benefit  from continued physical therapy to improve knee mobility, increase strength, maximize gait efficency and work toward remaining goals and independent exercise.     Rehab Potential  Good    PT Frequency  2x / week    PT Duration  6 weeks    PT Treatment/Interventions  ADLs/Self Care Home Management;Cryotherapy;Electrical Stimulation;Iontophoresis 50m/ml Dexamethasone;Moist Heat;Gait training;Therapeutic activities;Therapeutic exercise;Manual techniques;Vasopneumatic Device;Taping;Passive range of  motion;Patient/family education;Balance training    PT Next Visit Plan   Assess tape for edema.  review/ update HEP, bike,  ROM, stretching, gait training, vaso for edema PRN    PT Home Exercise Plan  quad set, hamstring stretch, heel slide, standing heel raises.  LAQ, gait training with heel strike/ toe off    Consulted and Agree with Plan of Care  Patient       Patient will benefit from skilled therapeutic intervention in order to improve the following deficits and impairments:  Abnormal gait, Increased edema, Pain, Postural dysfunction, Improper body mechanics, Decreased activity tolerance, Decreased endurance, Decreased strength, Difficulty walking, Decreased range of motion, Increased fascial restricitons, Decreased knowledge of use of DME  Visit Diagnosis: Chronic pain of right knee  Stiffness of right knee, not elsewhere classified  Muscle weakness (generalized)  Other abnormalities of gait and mobility  Localized edema     Problem List Patient Active Problem List   Diagnosis Date Noted  . Primary osteoarthritis of right knee 05/09/2018  . Osteoarthritis of left knee 09/12/2017  . MDD (major depressive disorder), recurrent severe, without psychosis ([place] 11/03/2016  . Alcohol-induced mood disorder with depressive symptoms ([place] 10/26/2016  . Poor dentition 06/26/2016  . Essential hypertension 06/26/2016  . Hyperlipidemia 06/26/2016  . Elevated transaminase level 08/29/2015  . Health care maintenance 12/31/2013  . Polysubstance abuse (h/o cocaine, ETOH, and tobacco abuse)    . Primary vulvar squamous cell carcinoma s/p resection 2011 02/13/2010  . 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 TOBACCO ABUSE 04/12/2006   KStarr LakePT, DPT, LAT, ATC  06/30/18  12:28 PM      C[facility] City[facility]aBarque [address] [address]Phone: [phone]  Fax:  [phone] Name: Katherine REVUELTAMRN:  [id number] of Birth: 3March 07, 1962

## 2018-07-14 ENCOUNTER — Encounter: Payer: Self-pay | Admitting: Physical Therapy

## 2018-07-14 ENCOUNTER — Ambulatory Visit: Payer: Medicaid Other | Attending: Orthopedic Surgery | Admitting: Physical Therapy

## 2018-07-14 DIAGNOSIS — M6281 Muscle weakness (generalized): Secondary | ICD-10-CM | POA: Diagnosis present

## 2018-07-14 DIAGNOSIS — M25561 Pain in right knee: Secondary | ICD-10-CM | POA: Diagnosis present

## 2018-07-14 DIAGNOSIS — G8929 Other chronic pain: Secondary | ICD-10-CM | POA: Diagnosis present

## 2018-07-14 DIAGNOSIS — R2689 Other abnormalities of gait and mobility: Secondary | ICD-10-CM | POA: Insufficient documentation

## 2018-07-14 DIAGNOSIS — R600 Localized edema: Secondary | ICD-10-CM | POA: Diagnosis present

## 2018-07-14 DIAGNOSIS — M25661 Stiffness of right knee, not elsewhere classified: Secondary | ICD-10-CM | POA: Diagnosis present

## 2018-07-14 NOTE — Therapy (Signed)
[place], [place], [address] Phone: [phone]   Fax:  [phone]  Physical Therapy Treatment  Patient Details  Name: [name] MRN: [id number] Date of Birth: 1960-12-30 Referring Provider (PT): Rosiland Oz graves MD   Encounter Date: 07/14/2018  PT End of Session - 07/14/18 1106    Visit Number  6    Number of Visits  17    Date for PT Re-Evaluation  07/30/18    Authorization Type  MCD (resubmit at 4th visit)    Authorization Time Period  07/13/2018- 08/02/2018    Authorization - Visit Number  2    Authorization - Number of Visits  3    PT Start Time  1102    PT Stop Time  1142    PT Time Calculation (min)  40 min    Activity Tolerance  Patient tolerated treatment well    Behavior During Therapy  [facility] for tasks assessed/performed       Past Medical History:  Diagnosis Date  . Cocaine abuse ([place])   . ETOH abuse   . Hypothyroidism   . Unspecified mood (affective) disorder [facility])     Past Surgical History:  Procedure Laterality Date  . radioactive iodine ablation     graves disease s/p  . TOTAL KNEE ARTHROPLASTY Right 05/09/2018   Procedure: TOTAL KNEE ARTHROPLASTY;  Surgeon: Dorna Leitz, MD;  Location: [facility];  Service: Orthopedics;  Laterality: Right;    There were no vitals filed for this visit.  Subjective Assessment - 07/14/18 1106    Subjective  "i am doing much better in regard to pain today, I've doing alot walking but haven't doing my exercises"     Currently in Pain?  Yes    Pain Score  0-No pain         [facility] PT Assessment - 07/14/18 1109      AROM   Right Knee Extension  9    Right Knee Flexion  117                   [facility] Adult PT Treatment/Exercise - 07/14/18 0001      Knee/Hip Exercises: Stretches   Active Hamstring Stretch  2 reps;30 seconds    Gastroc Stretch  2 reps;30 seconds;Both   slant board     Knee/Hip Exercises: Aerobic   Nustep  L4 x 5  min LE only       Knee/Hip Exercises: Machines for Strengthening   Cybex Leg Press  2 x 15 with 20#      Knee/Hip Exercises: Standing   Hip Abduction  1 set;10 reps;Knee straight    Hip Extension  1 set;10 reps      Knee/Hip Exercises: Seated   Long Arc Quad  2 sets;10 reps;Strengthening    Long Arc Quad Weight  5 lbs.    Hamstring Curl  2 sets;15 reps;Strengthening;Right   cues for controlled eccentrics   Hamstring Limitations  green band    Sit to Sand  10 reps;without UE support   x 2 sets     Knee/Hip Exercises: Supine   Straight Leg Raises  1 set;15 reps   with sustained quad set     Manual Therapy   Joint Mobilization  external tibial rotation with extension to activate screw home mechanism with knee extension, PA/ AP in supine grade Iv             PT Education - 07/14/18  1134    Education Details  importance of doing exercise in combination with walking    Person(s) Educated  Patient    Methods  Explanation;Verbal cues    Comprehension  Verbalized understanding       PT Short Term Goals - 06/30/18 1206      PT SHORT TERM GOAL #1   Title  pt to be I with inital HEp     Time  3    Status  Achieved      PT SHORT TERM GOAL #2   Title  pt to verbalize/ demo techniques to reduce pain and swelling via RICE and HEP     Period  Weeks    Status  Achieved      PT SHORT TERM GOAL #3   Title  increase R knee extension by >/= 5 degrees for therapuetic progression     Baseline  extension -12 degrees (pt reports she has been sleeping with a pillow beneath her knee)    Status  On-going    Target Date  07/28/18        PT Long Term Goals - 06/30/18 1219      PT LONG TERM GOAL #1   Title  increase R knee ROM to >/= 3 to 120 for functional ROM required for functional and efficent gait pattern    Baseline  -12 to 118 degrees today     Time  8    Period  Weeks    Status  On-going    Target Date  07/30/18      PT LONG TERM GOAL #2   Title  increase strength to  >/= 4+/5 in the R knee to promote stability with walking/ standing     Baseline  3+/5 in available ROM    Time  8    Period  Weeks    Status  On-going    Target Date  07/30/18      PT LONG TERM GOAL #3   Title  increase walking/ standing to >/= 45 min and navigate up/down 10 steps for functional endurance/ mobility required for community ambulation with LRAD     Baseline  able to walk/ stand 10 min with SPC     Time  8    Period  Weeks    Status  On-going    Target Date  07/30/18      PT LONG TERM GOAL #4   Title  increase LEFS by >/= 15 to demonstarte improvement in function     Baseline  inital score 49/80    Time  8    Period  Weeks    Status  On-going    Target Date  07/30/18      PT LONG TERM GOAL #5   Title  pt to be I with all HEP given as of last visit to maintain and progress current level of function     Baseline  independent with current HEP and progressing as able.     Time  8    Period  Weeks    Status  On-going    Target Date  07/30/18            Plan - 07/14/18 1133    Clinical Impression Statement  pt reports that she was been walking more but states she hasn't been as compliant with her HEP. She has improved her knee extension to 9 degrees and is maintaining her flexion ROM. continued mobs to promote tibiofemoral ROM  and hip/ knee strengthening which she perofrmed well.     PT Treatment/Interventions  ADLs/Self Care Home Management;Cryotherapy;Electrical Stimulation;Iontophoresis 4mg /ml Dexamethasone;Moist Heat;Gait training;Therapeutic activities;Therapeutic exercise;Manual techniques;Vasopneumatic Device;Taping;Passive range of motion;Patient/family education;Balance training    PT Next Visit Plan  review/ update HEP, bike,  ROM, stretching, gait training,     PT Home Exercise Plan  quad set, hamstring stretch, heel slide, standing heel raises.  LAQ, gait training with heel strike/ toe off    Consulted and Agree with Plan of Care  Patient        Patient will benefit from skilled therapeutic intervention in order to improve the following deficits and impairments:  Abnormal gait, Increased edema, Pain, Postural dysfunction, Improper body mechanics, Decreased activity tolerance, Decreased endurance, Decreased strength, Difficulty walking, Decreased range of motion, Increased fascial restricitons, Decreased knowledge of use of DME  Visit Diagnosis: Chronic pain of right knee  Stiffness of right knee, not elsewhere classified  Muscle weakness (generalized)  Other abnormalities of gait and mobility  Localized edema     Problem List Patient Active Problem List   Diagnosis Date Noted  . Primary osteoarthritis of right knee 05/09/2018  . Osteoarthritis of left knee 09/12/2017  . MDD (major depressive disorder), recurrent severe, without psychosis ([place]) 11/03/2016  . Alcohol-induced mood disorder with depressive symptoms ([place]) 10/26/2016  . Poor dentition 06/26/2016  . Essential hypertension 06/26/2016  . Hyperlipidemia 06/26/2016  . Elevated transaminase level 08/29/2015  . Health care maintenance 12/31/2013  . Polysubstance abuse (h/o cocaine, ETOH, and tobacco abuse)    . Primary vulvar squamous cell carcinoma s/p resection 2011 02/13/2010  . 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 TOBACCO ABUSE 04/12/2006   Starr Lake PT, DPT, LAT, ATC  07/14/18  11:42 AM      [facility] [address] [place], [place], [address] Phone: [phone]   Fax:  [phone]  Name: [name] MRN: [id number] Date of Birth: Oct 01, 1960

## 2018-07-16 ENCOUNTER — Ambulatory Visit: Payer: Medicaid Other | Admitting: Physical Therapy

## 2018-07-16 ENCOUNTER — Encounter: Payer: Self-pay | Admitting: Physical Therapy

## 2018-07-16 DIAGNOSIS — M6281 Muscle weakness (generalized): Secondary | ICD-10-CM

## 2018-07-16 DIAGNOSIS — M25661 Stiffness of right knee, not elsewhere classified: Secondary | ICD-10-CM

## 2018-07-16 DIAGNOSIS — M25561 Pain in right knee: Secondary | ICD-10-CM | POA: Diagnosis not present

## 2018-07-16 DIAGNOSIS — R2689 Other abnormalities of gait and mobility: Secondary | ICD-10-CM

## 2018-07-16 DIAGNOSIS — G8929 Other chronic pain: Secondary | ICD-10-CM

## 2018-07-16 DIAGNOSIS — R600 Localized edema: Secondary | ICD-10-CM

## 2018-07-16 NOTE — Therapy (Signed)
[place], [place], [address] Phone: [phone]   Fax:  [phone]  Physical Therapy Treatment  Patient Details  Name: [name] MRN: [id number] Date of Birth: 1961/05/16 Referring Provider (PT): Rosiland Oz graves MD   Encounter Date: 07/16/2018  PT End of Session - 07/16/18 1047    Visit Number  7    Number of Visits  17    Date for PT Re-Evaluation  07/30/18    Authorization Type  MCD (resubmit at 4th visit)    Authorization Time Period  07/13/2018- 08/02/2018    Authorization - Visit Number  2    Authorization - Number of Visits  3    PT Start Time  6834    PT Stop Time  1962   pt reported she had to leave early   PT Time Calculation (min)  30 min    Activity Tolerance  Patient tolerated treatment well    Behavior During Therapy  [facility] for tasks assessed/performed       Past Medical History:  Diagnosis Date  . Cocaine abuse ([place])   . ETOH abuse   . Hypothyroidism   . Unspecified mood (affective) disorder [facility])     Past Surgical History:  Procedure Laterality Date  . radioactive iodine ablation     graves disease s/p  . TOTAL KNEE ARTHROPLASTY Right 05/09/2018   Procedure: TOTAL KNEE ARTHROPLASTY;  Surgeon: Dorna Leitz, MD;  [facility];  Service: Orthopedics;  Laterality: Right;    There were no vitals filed for this visit.  Subjective Assessment - 07/16/18 1020    Subjective  " i am alittle sore after the last session, no pain though  just numbnees"     Currently in Pain?  Yes    Pain Score  0-No pain    Pain Orientation  Right    Aggravating Factors   N/A    Pain Relieving Factors  ice, rest         [facility] PT Assessment - 07/16/18 0001      Assessment   Medical Diagnosis  R TKA    Referring Provider (PT)  Rosiland Oz graves MD      AROM   Right Knee Extension  15    Right Knee Flexion  121                   [facility] Adult PT Treatment/Exercise - 07/16/18  0001      Knee/Hip Exercises: Stretches   Active Hamstring Stretch  2 reps;30 seconds    Gastroc Stretch  2 reps;30 seconds;Both      Knee/Hip Exercises: Aerobic   Recumbent Bike  L 3 x 5 min   lowering seat at 2:30 to promote knee flexion     Knee/Hip Exercises: Standing   Other Standing Knee Exercises  wall squat 1 x 12   mini squat using toes as VC for proper squat height     Knee/Hip Exercises: Seated   Long Arc Quad  Strengthening;2 sets   12 with red theraband   Hamstring Curl  2 sets;Strengthening;Right   12 sets   Hamstring Limitations  red theraband      Manual Therapy   Joint Mobilization  external tibial rotation with extension to activate screw home mechanism with knee extension, PA/ AP in supine grade IV             PT Education - 07/16/18 1046    Education  Details  updated HEP for quad and hamstring strengthening.     Person(s) Educated  Patient    Methods  Explanation;Verbal cues;Handout    Comprehension  Verbalized understanding;Verbal cues required       PT Short Term Goals - 06/30/18 1206      PT SHORT TERM GOAL #1   Title  pt to be I with inital HEp     Time  3    Status  Achieved      PT SHORT TERM GOAL #2   Title  pt to verbalize/ demo techniques to reduce pain and swelling via RICE and HEP     Period  Weeks    Status  Achieved      PT SHORT TERM GOAL #3   Title  increase R knee extension by >/= 5 degrees for therapuetic progression     Baseline  extension -12 degrees (pt reports she has been sleeping with a pillow beneath her knee)    Status  On-going    Target Date  07/28/18        PT Long Term Goals - 06/30/18 1219      PT LONG TERM GOAL #1   Title  increase R knee ROM to >/= 3 to 120 for functional ROM required for functional and efficent gait pattern    Baseline  -12 to 118 degrees today     Time  8    Period  Weeks    Status  On-going    Target Date  07/30/18      PT LONG TERM GOAL #2   Title  increase strength to >/=  4+/5 in the R knee to promote stability with walking/ standing     Baseline  3+/5 in available ROM    Time  8    Period  Weeks    Status  On-going    Target Date  07/30/18      PT LONG TERM GOAL #3   Title  increase walking/ standing to >/= 45 min and navigate up/down 10 steps for functional endurance/ mobility required for community ambulation with LRAD     Baseline  able to walk/ stand 10 min with SPC     Time  8    Period  Weeks    Status  On-going    Target Date  07/30/18      PT LONG TERM GOAL #4   Title  increase LEFS by >/= 15 to demonstarte improvement in function     Baseline  inital score 49/80    Time  8    Period  Weeks    Status  On-going    Target Date  07/30/18      PT LONG TERM GOAL #5   Title  pt to be I with all HEP given as of last visit to maintain and progress current level of function     Baseline  independent with current HEP and progressing as able.     Time  8    Period  Weeks    Status  On-going    Target Date  07/30/18            Plan - 07/16/18 1047    Clinical Impression Statement  pt demonstrates increased limtitation with extension compared to previous session at 15 degrees today, pt continues to report no pain. worked on mobs to promote extension and Orthoptist. updated HEP for theraband quad and hamstring strengthening.     PT  Treatment/Interventions  ADLs/Self Care Home Management;Cryotherapy;Electrical Stimulation;Iontophoresis 4mg /ml Dexamethasone;Moist Heat;Gait training;Therapeutic activities;Therapeutic exercise;Manual techniques;Vasopneumatic Device;Taping;Passive range of motion;Patient/family education;Balance training    PT Next Visit Plan  assess ROM, goals, resubmit to MCD. update HEP, bike,  ROM, stretching, gait training,     PT Home Exercise Plan  quad set, hamstring stretch, heel slide, standing heel raises.  LAQ, gait training with heel strike/ toe off    Consulted and Agree with Plan of Care  Patient        Patient will benefit from skilled therapeutic intervention in order to improve the following deficits and impairments:  Abnormal gait, Increased edema, Pain, Postural dysfunction, Improper body mechanics, Decreased activity tolerance, Decreased endurance, Decreased strength, Difficulty walking, Decreased range of motion, Increased fascial restricitons, Decreased knowledge of use of DME  Visit Diagnosis: Chronic pain of right knee  Stiffness of right knee, not elsewhere classified  Muscle weakness (generalized)  Other abnormalities of gait and mobility  Localized edema     Problem List Patient Active Problem List   Diagnosis Date Noted  . Primary osteoarthritis of right knee 05/09/2018  . Osteoarthritis of left knee 09/12/2017  . MDD (major depressive disorder), recurrent severe, without psychosis ([place]) 11/03/2016  . Alcohol-induced mood disorder with depressive symptoms ([place]) 10/26/2016  . Poor dentition 06/26/2016  . Essential hypertension 06/26/2016  . Hyperlipidemia 06/26/2016  . Elevated transaminase level 08/29/2015  . Health care maintenance 12/31/2013  . Polysubstance abuse (h/o cocaine, ETOH, and tobacco abuse)    . Primary vulvar squamous cell carcinoma s/p resection 2011 02/13/2010  . 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 TOBACCO ABUSE 04/12/2006   Starr Lake PT, DPT, LAT, ATC  07/16/18  10:50 AM      [facility] [address] [place], [place], [address] Phone: [phone]   Fax:  [phone]  Name: [name] MRN: [id number] Date of Birth: May 29, 1961

## 2018-07-18 ENCOUNTER — Encounter: Payer: Self-pay | Admitting: Internal Medicine

## 2018-07-21 ENCOUNTER — Ambulatory Visit: Payer: Medicaid Other | Admitting: Physical Therapy

## 2018-07-21 ENCOUNTER — Encounter: Payer: Self-pay | Admitting: Physical Therapy

## 2018-07-21 DIAGNOSIS — M25661 Stiffness of right knee, not elsewhere classified: Secondary | ICD-10-CM

## 2018-07-21 DIAGNOSIS — R2689 Other abnormalities of gait and mobility: Secondary | ICD-10-CM

## 2018-07-21 DIAGNOSIS — G8929 Other chronic pain: Secondary | ICD-10-CM

## 2018-07-21 DIAGNOSIS — M6281 Muscle weakness (generalized): Secondary | ICD-10-CM

## 2018-07-21 DIAGNOSIS — M25561 Pain in right knee: Principal | ICD-10-CM

## 2018-07-21 DIAGNOSIS — R600 Localized edema: Secondary | ICD-10-CM

## 2018-07-21 NOTE — Therapy (Signed)
[place], [place], [address] Phone: [phone]   Fax:  [phone]  Physical Therapy Treatment  Patient Details  Name: [name] MRN: [id number] Date of Birth: April 27, 1961 Referring Provider (PT): Rosiland Oz graves MD   Encounter Date: 07/21/2018  PT End of Session - 07/21/18 1209    Visit Number  8    Number of Visits  17    Date for PT Re-Evaluation  07/30/18    Authorization Type  MCD (resubmit at 4th visit)    Authorization Time Period  07/13/2018- 08/02/2018    Authorization - Visit Number  3    Authorization - Number of Visits  3    PT Start Time  1102    PT Stop Time  1145    PT Time Calculation (min)  43 min    Activity Tolerance  Patient tolerated treatment well    Behavior During Therapy  [facility] for tasks assessed/performed       Past Medical History:  Diagnosis Date  . Cocaine abuse ([place])   . ETOH abuse   . Hypothyroidism   . Unspecified mood (affective) disorder [facility])     Past Surgical History:  Procedure Laterality Date  . radioactive iodine ablation     graves disease s/p  . TOTAL KNEE ARTHROPLASTY Right 05/09/2018   Procedure: TOTAL KNEE ARTHROPLASTY;  Surgeon: Dorna Leitz, MD;  [facility];  Service: Orthopedics;  Laterality: Right;    There were no vitals filed for this visit.  Subjective Assessment - 07/21/18 1159    Subjective  no pain.  I have one more visit my Aunt can bring me to.  I want SCAT but I don't have the APPLICATION PAPERS  NO PAIN  >  i HAVE BEEN DOING THE EXERCISES.     Currently in Pain?  No/denies    Pain Location  Knee    Pain Orientation  Right    Pain Descriptors / Indicators  Numbness    Pain Type  Surgical pain    Aggravating Factors   n/a    Pain Relieving Factors  REST    Effect of Pain on Daily Activities   NEEDS A CANE         [facility] PT Assessment - 07/21/18 0001      AROM   Right Knee Extension  15    Right Knee Flexion  120                    [facility] Adult PT Treatment/Exercise - 07/21/18 0001      High Level Balance   High Level Balance Comments  narrowed base static and dynamic exercise  close SBA to contact guard.  patient fatigues quickly      Knee/Hip Exercises: Stretches   Passive Hamstring Stretch  3 reps;20 seconds    Passive Hamstring Stretch Limitations  sitting    Gastroc Stretch  2 reps;30 seconds    Gastroc Stretch Limitations  incline both      Knee/Hip Exercises: Aerobic   Recumbent Bike  L 3 x 5 min   lowering seat at 2:30 to promote knee flexion     Knee/Hip Exercises: Standing   Terminal Knee Extension  2 sets;Right;Strengthening;Theraband    Theraband Level (Terminal Knee Extension)  Level 3 (Green)    Terminal Knee Extension Limitations  mod cues,  min assist compensation    Forward Step Up  Right;20 reps;Hand Hold: 2  Forward Step Up Limitations  cues fingers just resting vs helping    SLS  --   cued technique,  1-2 seconds.best   SLS with Vectors  3 way close stand by for safety mod cues initially    Gait Training  practiced increasing trunk rotations to improve balance  decrease limp.      Knee/Hip Exercises: Seated   Long Arc Quad  Strengthening;Right;1 set;10 reps;Weights    Long Arc Quad Weight  5 lbs.    Long [employer] Limitations  10 second holds      Manual Therapy   Joint Mobilization  sitting due to patient not wanted to get supine tibia  rotations with movement into extension.  ROM increased with reps      Soft tissue mobilization  medial hanstring retrograde,  patellA MOBILITY CHECKED    DISTAL GLIDES WITH QUAD STRETCH ( SITTING)             PT Education - 07/21/18 1208    Education Details  gait.  exercise form    Methods  Explanation    Comprehension  Verbalized understanding;Returned demonstration       PT Short Term Goals - 07/21/18 1253      PT SHORT TERM GOAL #1   Title  pt to be I with inital HEp     Baseline  does her HEP    Time  3     Period  Weeks    Status  Achieved      PT SHORT TERM GOAL #2   Title  pt to verbalize/ demo techniques to reduce pain and swelling via RICE and HEP     Time  3    Period  Weeks    Status  Achieved      PT SHORT TERM GOAL #3   Title  increase R knee extension by >/= 5 degrees for therapuetic progression     Baseline  -15 extension . improves with treatment however it has not been carrying over     Time  3    Period  Weeks    Status  On-going        PT Long Term Goals - 07/21/18 1257      PT LONG TERM GOAL #1   Title  increase R knee ROM to >/= 3 to 120 for functional ROM required for functional and efficent gait pattern    Baseline  -55 to 120    Time  8    Status  On-going      PT LONG TERM GOAL #2   Title  increase strength to >/= 4+/5 in the R knee to promote stability with walking/ standing     Baseline  4-/5 quads  hamstrings 4/5 right in available ROM    Time  8    Period  Weeks    Status  On-going      PT LONG TERM GOAL #3   Title  increase walking/ standing to >/= 45 min and navigate up/down 10 steps for functional endurance/ mobility required for community ambulation with LRAD     Baseline  can do 20 steps in clinic at 6 inches.  Endurance not assessed .  needs sitting rests in clinic several times during 45 minute session    Time  8    Period  Weeks    Status  On-going      PT LONG TERM GOAL #4   Title  increase LEFS by >/= 15  to demonstarte improvement in function     Time  8    Period  Weeks    Status  Unable to assess      PT LONG TERM GOAL #5   Title  pt to be I with all HEP given as of last visit to maintain and progress current level of function     Baseline  independent with current HEP and progressing as able.     Time  8    Period  Weeks    Status  On-going            Plan - 07/21/18 1210    Clinical Impression Statement  quads 4-/5,  hamstrings 4/10 MMT. Progress towRD stg #2.   Gait improves with cues however when not focused ,  gait deviates,  patient worried about transportation so  SCAT forms printed for her off internet,.       Patient will benefit from skilled therapeutic intervention in order to improve the following deficits and impairments:     Visit Diagnosis: Chronic pain of right knee  Stiffness of right knee, not elsewhere classified  Muscle weakness (generalized)  Other abnormalities of gait and mobility  Localized edema     Problem List Patient Active Problem List   Diagnosis Date Noted  . Primary osteoarthritis of right knee 05/09/2018  . Osteoarthritis of left knee 09/12/2017  . MDD (major depressive disorder), recurrent severe, without psychosis ([place]) 11/03/2016  . Alcohol-induced mood disorder with depressive symptoms ([place]) 10/26/2016  . Poor dentition 06/26/2016  . Essential hypertension 06/26/2016  . Hyperlipidemia 06/26/2016  . Elevated transaminase level 08/29/2015  . Health care maintenance 12/31/2013  . Polysubstance abuse (h/o cocaine, ETOH, and tobacco abuse)    . Primary vulvar squamous cell carcinoma s/p resection 2011 02/13/2010  . 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 TOBACCO ABUSE 04/12/2006    Anjana Cheek PTA 07/21/2018, 1:05 PM  [facility] [address] [place], [place], [address] Phone: [phone]   Fax:  [phone]  Name: [name] MRN: [id number] Date of Birth: 09/07/1960

## 2018-07-28 ENCOUNTER — Encounter: Payer: Self-pay | Admitting: Physical Therapy

## 2018-07-28 ENCOUNTER — Ambulatory Visit: Payer: Medicaid Other | Admitting: Physical Therapy

## 2018-07-28 DIAGNOSIS — M6281 Muscle weakness (generalized): Secondary | ICD-10-CM

## 2018-07-28 DIAGNOSIS — M25561 Pain in right knee: Principal | ICD-10-CM

## 2018-07-28 DIAGNOSIS — R2689 Other abnormalities of gait and mobility: Secondary | ICD-10-CM

## 2018-07-28 DIAGNOSIS — R600 Localized edema: Secondary | ICD-10-CM

## 2018-07-28 DIAGNOSIS — G8929 Other chronic pain: Secondary | ICD-10-CM

## 2018-07-28 DIAGNOSIS — M25661 Stiffness of right knee, not elsewhere classified: Secondary | ICD-10-CM

## 2018-07-28 NOTE — Therapy (Addendum)
[place], [place], [address] Phone: [phone]   Fax:  [phone]  Physical Therapy Treatment / Re-evaluation / Discharge Summary  Patient Details  Name: [name] MRN: [id number] Date of Birth: 1960/12/10 Referring Provider (PT): Rosiland Oz graves MD   Encounter Date: 07/28/2018  PT End of Session - 07/28/18 1059    Visit Number  9    Number of Visits  17    Date for PT Re-Evaluation  09/22/18    Authorization Type  MCD (resubmit at 4th visit)    Authorization Time Period  07/13/2018- 08/02/2018 (resubmitted on 07/28/2018)    PT Start Time  1100    PT Stop Time  1138    PT Time Calculation (min)  38 min    Activity Tolerance  Patient tolerated treatment well    Behavior During Therapy  [facility] for tasks assessed/performed       Past Medical History:  Diagnosis Date  . Cocaine abuse ([place])   . ETOH abuse   . Hypothyroidism   . Unspecified mood (affective) disorder [facility])     Past Surgical History:  Procedure Laterality Date  . radioactive iodine ablation     graves disease s/p  . TOTAL KNEE ARTHROPLASTY Right 05/09/2018   Procedure: TOTAL KNEE ARTHROPLASTY;  Surgeon: Dorna Leitz, MD;  Location: [facility];  Service: Orthopedics;  Laterality: Right;    There were no vitals filed for this visit.  Subjective Assessment - 07/28/18 1100    Subjective  " I am still feeling numb around my knee, I still don't have any pain. I am trying to apply for SCAT so I can get to treatment"     Patient Stated Goals  get back to walking, decrease pain,     Currently in Pain?  No/denies    Pain Score  0-No pain    Pain Location  Knee    Pain Orientation  Right    Pain Descriptors / Indicators  Numbness    Pain Type  Surgical pain    Pain Onset  More than a month ago    Pain Frequency  Intermittent    Aggravating Factors   N/A    Pain Relieving Factors  resting    Effect of Pain on Daily Activities  limited  mobility and signficant lateral sway         [facility] PT Assessment - 07/28/18 1107      Assessment   Medical Diagnosis  R TKA    Referring Provider (PT)  Rosiland Oz graves MD    Onset Date/Surgical Date  05/09/18    Hand Dominance  Right    Next MD Visit  08/11/2018      Observation/Other Assessments   Lower Extremity Functional Scale   41/80      AROM   Right Knee Extension  13    Right Knee Flexion  118      PROM   Right Knee Extension  10    Right Knee Flexion  122      Strength   Right Knee Flexion  4-/5   soreness during testing   Right Knee Extension  4-/5      Palpation   Palpation comment  TTP peri-patellar and along medial/ lateral joint line, as well as along distal semi-tendionosus, N/T along the incision                   [facility] Adult PT Treatment/Exercise -  07/28/18 0001      Knee/Hip Exercises: Stretches   Active Hamstring Stretch  2 reps;30 seconds      Knee/Hip Exercises: Standing   Gait Training  in // focusing on heel strike/ toe off forward /backward  using mirror for visual feedback.    demonstration and intermittent verbal cues for assistance     Knee/Hip Exercises: Seated   Long Arc Quad  Strengthening;Right;1 set;10 reps;Weights    Long Arc Quad Weight  5 lbs.      Manual Therapy   Joint Mobilization  seated knee extension with tibial ER to promote screw home mechanism             PT Education - 07/28/18 1130    Education Details  reviewed previously provided HEP and updated for seated hamstring stretch, provided SCAT paperwork to assist with conistency of treatment due to pt having difficulty with transportation    Person(s) Educated  Patient    Methods  Explanation;Verbal cues;Handout    Comprehension  Verbalized understanding;Verbal cues required       PT Short Term Goals - 07/28/18 1109      PT SHORT TERM GOAL #1   Title  pt to be I with inital HEp     Time  3    Period  Weeks    Status  Achieved      PT SHORT  TERM GOAL #2   Title  pt to verbalize/ demo techniques to reduce pain and swelling via RICE and HEP     Time  3    Period  Weeks    Status  Achieved      PT SHORT TERM GOAL #3   Title  increase R knee extension by >/= 5 degrees for therapuetic progression     Baseline  -13 degrees of exgtension, but fluctuates from day to day     Period  Weeks    Status  On-going    Target Date  08/18/18        PT Long Term Goals - 07/28/18 1111      PT LONG TERM GOAL #1   Title  increase R knee ROM to >/= 3 to 120 for functional ROM required for functional and efficent gait pattern    Baseline  13 to 118    Period  Weeks    Status  On-going    Target Date  09/08/18      PT LONG TERM GOAL #2   Title  increase strength to >/= 4+/5 in the R knee to promote stability with walking/ standing     Baseline  4-/5 quads  hamstrings 4/5 right in available ROM    Time  8    Period  Weeks    Status  On-going    Target Date  09/08/18      PT LONG TERM GOAL #3   Title  increase walking/ standing to >/= 45 min and navigate up/down 10 steps for functional endurance/ mobility required for community ambulation with LRAD     Baseline  can do 20 steps in clinic at 6 inches.per pt report can amb 45 min but pain increases to 8/10  needs sitting rests in clinic several times during 45 minute session    Time  8    Period  Weeks    Status  On-going    Target Date  09/08/18      PT LONG TERM GOAL #4   Title  increase LEFS by >/=  15 to demonstarte improvement in function     Baseline  41/80    Period  Weeks    Status  On-going    Target Date  09/08/18      PT LONG TERM GOAL #5   Title  pt to be I with all HEP given as of last visit to maintain and progress current level of function     Baseline  independent with current HEP and progressing as tolerated    Period  Weeks    Status  On-going    Target Date  09/08/18            Plan - 07/28/18 1131    Clinical Impression Statement  Mrs Hidrogo  continues to report no pain and only N/T in the anterior aspect of the knee. she continues to demonstrated limited extension at 13 degrees today activty and 118 degrees of flexion, She is progressing with knee strengthening with soreness noted during hamstring assessment. She is making progress towards goals and is motivated to continue to improve strength, ROM, and functinonal mobility. provided handout to assist with pt applying for SCAT to help with transportation and appointment consistency. She would benefit from physical therapy to promote knee ROM, increase strength and maximize function by addressing the deficits listed.     Rehab Potential  Good    PT Frequency  2x / week    PT Duration  6 weeks    PT Treatment/Interventions  ADLs/Self Care Home Management;Cryotherapy;Electrical Stimulation;Iontophoresis 65m/ml Dexamethasone;Moist Heat;Gait training;Therapeutic activities;Therapeutic exercise;Manual techniques;Vasopneumatic Device;Taping;Passive range of motion;Patient/family education;Balance training    PT Next Visit Plan  assess ROM, goals, resubmit to MCD. update HEP, bike,  ROM, stretching, gait training,     PT Home Exercise Plan  quad set, hamstring stretch, heel slide, standing heel raises.  LAQ, gait training with heel strike/ toe off    Consulted and Agree with Plan of Care  Patient       Patient will benefit from skilled therapeutic intervention in order to improve the following deficits and impairments:  Abnormal gait, Increased edema, Pain, Postural dysfunction, Improper body mechanics, Decreased activity tolerance, Decreased endurance, Decreased strength, Difficulty walking, Decreased range of motion, Increased fascial restricitons, Decreased knowledge of use of DME  Visit Diagnosis: Chronic pain of right knee  Stiffness of right knee, not elsewhere classified  Muscle weakness (generalized)  Other abnormalities of gait and mobility  Localized edema     Problem  List Patient Active Problem List   Diagnosis Date Noted  . Primary osteoarthritis of right knee 05/09/2018  . Osteoarthritis of left knee 09/12/2017  . MDD (major depressive disorder), recurrent severe, without psychosis ([place] 11/03/2016  . Alcohol-induced mood disorder with depressive symptoms ([place] 10/26/2016  . Poor dentition 06/26/2016  . Essential hypertension 06/26/2016  . Hyperlipidemia 06/26/2016  . Elevated transaminase level 08/29/2015  . Health care maintenance 12/31/2013  . Polysubstance abuse (h/o cocaine, ETOH, and tobacco abuse)    . Primary vulvar squamous cell carcinoma s/p resection 2011 02/13/2010  . 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 TOBACCO ABUSE 04/12/2006   KStarr LakePT, DPT, LAT, ATC  07/28/18  11:37 AM      CS[facility] QuarterCV[facility] Wayne[address]GRed[address] NAl[place] [address]Phone: [phone]  Fax:  [phone] Name: DULAH OLMOMRN: [id number] of Birth: 301-08-1960      PHYSICAL THERAPY DISCHARGE SUMMARY  Visits from Start of Care: 9  Current functional level related to  goals / functional outcomes: See goals   Remaining deficits: Not returning since last attended visit.   Education / Equipment: HEP  Plan: Patient agrees to discharge.  Patient goals were not met. Patient is being discharged due to not returning since the last visit.  ?????     Jaxson Keener PT, DPT, LAT, ATC  09/03/18  8:27 AM

## 2018-08-07 ENCOUNTER — Other Ambulatory Visit: Payer: Self-pay | Admitting: Internal Medicine

## 2018-08-12 ENCOUNTER — Ambulatory Visit: Payer: Medicaid Other | Admitting: Physical Therapy

## 2018-08-14 ENCOUNTER — Encounter: Payer: Self-pay | Admitting: Physical Therapy

## 2018-08-15 ENCOUNTER — Ambulatory Visit (INDEPENDENT_AMBULATORY_CARE_PROVIDER_SITE_OTHER): Payer: Medicaid Other | Admitting: Internal Medicine

## 2018-08-15 ENCOUNTER — Other Ambulatory Visit: Payer: Self-pay

## 2018-08-15 ENCOUNTER — Encounter: Payer: Self-pay | Admitting: Internal Medicine

## 2018-08-15 VITALS — BP 116/56 | HR 62 | Temp 97.5°F | Wt 167.1 lb

## 2018-08-15 DIAGNOSIS — Z0000 Encounter for general adult medical examination without abnormal findings: Secondary | ICD-10-CM

## 2018-08-15 DIAGNOSIS — R740 Nonspecific elevation of levels of transaminase and lactic acid dehydrogenase [LDH]: Secondary | ICD-10-CM | POA: Diagnosis not present

## 2018-08-15 DIAGNOSIS — F332 Major depressive disorder, recurrent severe without psychotic features: Secondary | ICD-10-CM

## 2018-08-15 DIAGNOSIS — F17200 Nicotine dependence, unspecified, uncomplicated: Secondary | ICD-10-CM | POA: Diagnosis not present

## 2018-08-15 DIAGNOSIS — E890 Postprocedural hypothyroidism: Secondary | ICD-10-CM | POA: Diagnosis not present

## 2018-08-15 DIAGNOSIS — I10 Essential (primary) hypertension: Secondary | ICD-10-CM

## 2018-08-15 DIAGNOSIS — M170 Bilateral primary osteoarthritis of knee: Secondary | ICD-10-CM | POA: Diagnosis not present

## 2018-08-15 DIAGNOSIS — Z862 Personal history of diseases of the blood and blood-forming organs and certain disorders involving the immune mechanism: Secondary | ICD-10-CM | POA: Diagnosis not present

## 2018-08-15 DIAGNOSIS — R7401 Elevation of levels of liver transaminase levels: Secondary | ICD-10-CM

## 2018-08-15 NOTE — Progress Notes (Signed)
   CC: HTN follow up  HPI:  [name] is a 58 y.o. year-old female with PMH listed below who presents to clinic for hypertension follow-up. Please see problem based assessment and plan for further details.   Past Medical History:  Diagnosis Date  . Cocaine abuse ([place])   . ETOH abuse   . Hypothyroidism   . Unspecified mood (affective) disorder ([facility])    Review of Systems:   Review of Systems  Constitutional: Negative for chills, fever, malaise/fatigue and weight loss.  Respiratory: Negative for cough and shortness of breath.   Cardiovascular: Negative for chest pain, palpitations and leg swelling.  Gastrointestinal: Negative for abdominal pain, blood in stool, constipation, diarrhea, melena, nausea and vomiting.  Musculoskeletal: Negative for falls and joint pain.  Skin: Negative for rash.  Neurological: Negative for dizziness and headaches.    Physical Exam: Vitals:   08/15/18 1315 08/15/18 1335  BP: (!) 128/32 (!) 116/56  Pulse: 65 62  Temp: (!) 97.5 F (36.4 C)   TempSrc: Oral   SpO2: 100%   Weight: 167 lb 1.6 oz (75.8 kg)    General: Well-appearing female in no acute distress Cardiac: regular rate and rhythm, nl S1/S2, no murmurs, rubs or gallops Pulm: CTAB, no wheezes or crackles, no increased work of breathing on room air  Abd: soft, NTND, bowel sounds present Ext: warm and well perfused, no peripheral edema   Assessment & Plan:   See Encounters Tab for problem based charting.  Patient discussed with Dr. Lynnae January

## 2018-08-15 NOTE — Patient Instructions (Signed)
[name],   I am glad your feeling well.   We obtained blood tests today to make sure that everything looks well. We will call you if the results are abnormal.  For the elevated liver enzymes I order a ultrasound of your liver.  Our radiology department will give you a call to schedule this.  I also order a mammogram for breast cancer screening.  The breast center will give you a call to schedule this.  Please make sure to return to stool card as instructed.  This is for colon cancer screening.  Call us if you have any questions or concerns.  -Dr. Frederico Hamman

## 2018-08-16 ENCOUNTER — Encounter: Payer: Self-pay | Admitting: Internal Medicine

## 2018-08-16 DIAGNOSIS — Z862 Personal history of diseases of the blood and blood-forming organs and certain disorders involving the immune mechanism: Secondary | ICD-10-CM | POA: Insufficient documentation

## 2018-08-16 LAB — CMP14 + ANION GAP
ALT: 107 IU/L — ABNORMAL HIGH (ref 0–32)
AST: 72 IU/L — ABNORMAL HIGH (ref 0–40)
Albumin/Globulin Ratio: 1.6 (ref 1.2–2.2)
Albumin: 4.1 g/dL (ref 3.8–4.9)
Alkaline Phosphatase: 69 IU/L (ref 39–117)
Anion Gap: 20 mmol/L — ABNORMAL HIGH (ref 10.0–18.0)
BILIRUBIN TOTAL: 0.4 mg/dL (ref 0.0–1.2)
BUN/Creatinine Ratio: 14 (ref 9–23)
BUN: 11 mg/dL (ref 6–24)
CHLORIDE: 100 mmol/L (ref 96–106)
CO2: 23 mmol/L (ref 20–29)
Calcium: 9.1 mg/dL (ref 8.7–10.2)
Creatinine, Ser: 0.78 mg/dL (ref 0.57–1.00)
GFR calc Af Amer: 98 mL/min/{1.73_m2} (ref >59)
GFR calc non Af Amer: 85 mL/min/{1.73_m2} (ref >59)
Globulin, Total: 2.6 g/dL (ref 1.5–4.5)
Glucose: 89 mg/dL (ref 65–99)
Potassium: 3.8 mmol/L (ref 3.5–5.2)
Sodium: 143 mmol/L (ref 134–144)
Total Protein: 6.7 g/dL (ref 6.0–8.5)

## 2018-08-16 LAB — LIPID PANEL
CHOL/HDL RATIO: 4.9 ratio — AB (ref 0.0–4.4)
Cholesterol, Total: 161 mg/dL (ref 100–199)
HDL: 33 mg/dL — ABNORMAL LOW (ref >39)
LDL CALC: 96 mg/dL (ref 0–99)
TRIGLYCERIDES: 158 mg/dL — AB (ref 0–149)
VLDL Cholesterol Cal: 32 mg/dL (ref 5–40)

## 2018-08-16 LAB — HEPATITIS B SURFACE ANTIBODY,QUALITATIVE: Hep B Surface Ab, Qual: NONREACTIVE

## 2018-08-16 LAB — CBC
Hematocrit: 40.7 % (ref 34.0–46.6)
Hemoglobin: 13.5 g/dL (ref 11.1–15.9)
MCH: 28.1 pg (ref 26.6–33.0)
MCHC: 33.2 g/dL (ref 31.5–35.7)
MCV: 85 fL (ref 79–97)
Platelets: 369 10*3/uL (ref 150–450)
RBC: 4.8 x10E6/uL (ref 3.77–5.28)
RDW: 13.6 % (ref 11.7–15.4)
WBC: 5.4 10*3/uL (ref 3.4–10.8)

## 2018-08-16 LAB — TSH: TSH: 7.26 u[IU]/mL — ABNORMAL HIGH (ref 0.450–4.500)

## 2018-08-16 NOTE — Assessment & Plan Note (Addendum)
This problem is chronic and stable. She underwent a R knee replacement in 05/2018 and has been doing well since then. She was on opiate therapy until last month due to post-surgical pain, but not on any pain medication at this time. Database reviewed and appropriate. States she completed physical therapy and has been doing well at home. Continues to experience intermittent pain in the L knee but she is able to perform ADLs. Will continue to monitor.

## 2018-08-16 NOTE — Assessment & Plan Note (Signed)
FIT testing and mammogram ordered today. Patient declined flu shot.

## 2018-08-16 NOTE — Assessment & Plan Note (Signed)
[name] has a history of elevated blood pressure at different points in time mixed with normal blood pressure readings. She has never been on anti-hypertensive medications. This could secondary to cocaine use, but unclear. Her blood pressure is at goal today, 116/56. Will continue to monitor.

## 2018-08-16 NOTE — Assessment & Plan Note (Addendum)
Long history of mildly elevated LFTs in the setting of alcohol use disorder since 2013. She continues to drink alcohol, especially on the weekends when she looses track of how many beers she drinks. Hepatitis studies negative in 2016, though HepB surface antibody not checked. She has normal platelet count. CT abdomen from 2007 with normal liver appearance, but no imaging since then. No cirrhotic stigmata observed on exam. Likely hepatic steatosis but will order RUQ [place] for further evaluation. Discussed and encouraged alcohol cessation but patient no interested in quitting at this time.

## 2018-08-16 NOTE — Assessment & Plan Note (Addendum)
History of Grave's disease s/p radioactive iodine ablation. Does well on synthroid 75 mcg when compliant. Reports compliance today and denies symptoms of hypothyroidism. Last TSH normal in 2018. Will check TSH today and continue Synthroid at current dose.

## 2018-08-16 NOTE — Assessment & Plan Note (Signed)
She continues to smoke about 10 cigarettes per day. She thinks she has been smoking for 20 or 30 years, but is not sure. Discussed and encouraged cessation but she is not interested in quitting at this time.

## 2018-08-16 NOTE — Assessment & Plan Note (Signed)
Previously on Celexa 40 mg QD for MDD. Reports she self-discontinued it in November because she was feeling well. She does not have any acute complaints today. Denies lack of concentration or energy, difficulty sleeping, changes in weight or appetite, feelings of guilt, and SI/HI. Will not resume SSRI at this time, but patient advised to call us if these symptoms return. If so, we can resume Celexa as she responded well to it and refer to behavioral health.

## 2018-08-16 NOTE — Assessment & Plan Note (Signed)
Last CBC from 05/2018 showed Hgb of 11.2. Of note, this was after R knee replacement. Iron studies from 2010 showed low iron and low saturation ratios. Ferritin was normal. Ordered CBC today. If anemia present, patient has agreed to return for lab visit for iron studies.

## 2018-08-18 NOTE — Progress Notes (Signed)
Internal Medicine Clinic Attending  Case discussed with Dr. Santos-Sanchez at the time of the visit.  We reviewed the resident's history and exam and pertinent patient test results.  I agree with the assessment, diagnosis, and plan of care documented in the resident's note.    

## 2018-08-19 ENCOUNTER — Ambulatory Visit: Payer: Medicaid Other | Admitting: Physical Therapy

## 2018-08-21 ENCOUNTER — Encounter: Payer: Self-pay | Admitting: Physical Therapy

## 2018-08-26 ENCOUNTER — Encounter: Payer: Self-pay | Admitting: Physical Therapy

## 2018-08-28 ENCOUNTER — Encounter: Payer: Self-pay | Admitting: Physical Therapy

## 2018-09-02 ENCOUNTER — Encounter: Payer: Self-pay | Admitting: Physical Therapy

## 2018-09-04 ENCOUNTER — Encounter: Payer: Self-pay | Admitting: Physical Therapy

## 2018-09-29 ENCOUNTER — Other Ambulatory Visit: Payer: Self-pay | Admitting: Internal Medicine

## 2018-09-29 DIAGNOSIS — E890 Postprocedural hypothyroidism: Secondary | ICD-10-CM

## 2018-09-29 DIAGNOSIS — Z1231 Encounter for screening mammogram for malignant neoplasm of breast: Secondary | ICD-10-CM

## 2018-10-04 ENCOUNTER — Emergency Department (HOSPITAL_COMMUNITY)
Admission: EM | Admit: 2018-10-04 | Discharge: 2018-10-04 | Disposition: A | Discharge status: Home / Self Care | Payer: Medicaid Other | Attending: Emergency Medicine | Admitting: Emergency Medicine

## 2018-10-04 ENCOUNTER — Other Ambulatory Visit: Payer: Self-pay

## 2018-10-04 ENCOUNTER — Encounter (HOSPITAL_COMMUNITY): Payer: Self-pay | Admitting: Emergency Medicine

## 2018-10-04 DIAGNOSIS — R4689 Other symptoms and signs involving appearance and behavior: Secondary | ICD-10-CM | POA: Insufficient documentation

## 2018-10-04 DIAGNOSIS — F17210 Nicotine dependence, cigarettes, uncomplicated: Secondary | ICD-10-CM | POA: Insufficient documentation

## 2018-10-04 DIAGNOSIS — Y907 Blood alcohol level of 200-239 mg/100 ml: Secondary | ICD-10-CM | POA: Diagnosis not present

## 2018-10-04 DIAGNOSIS — F1490 Cocaine use, unspecified, uncomplicated: Secondary | ICD-10-CM | POA: Diagnosis not present

## 2018-10-04 DIAGNOSIS — I10 Essential (primary) hypertension: Secondary | ICD-10-CM | POA: Diagnosis not present

## 2018-10-04 DIAGNOSIS — Z046 Encounter for general psychiatric examination, requested by authority: Secondary | ICD-10-CM | POA: Diagnosis present

## 2018-10-04 DIAGNOSIS — E039 Hypothyroidism, unspecified: Secondary | ICD-10-CM | POA: Diagnosis not present

## 2018-10-04 DIAGNOSIS — F39 Unspecified mood [affective] disorder: Secondary | ICD-10-CM | POA: Insufficient documentation

## 2018-10-04 DIAGNOSIS — F10920 Alcohol use, unspecified with intoxication, uncomplicated: Secondary | ICD-10-CM | POA: Insufficient documentation

## 2018-10-04 DIAGNOSIS — Z79899 Other long term (current) drug therapy: Secondary | ICD-10-CM | POA: Insufficient documentation

## 2018-10-04 DIAGNOSIS — F329 Major depressive disorder, single episode, unspecified: Secondary | ICD-10-CM | POA: Diagnosis not present

## 2018-10-04 LAB — RAPID URINE DRUG SCREEN, HOSP PERFORMED
Amphetamines: NOT DETECTED
Barbiturates: NOT DETECTED
Benzodiazepines: NOT DETECTED
Cocaine: POSITIVE — AB
Opiates: NOT DETECTED
Tetrahydrocannabinol: NOT DETECTED

## 2018-10-04 LAB — COMPREHENSIVE METABOLIC PANEL
ALT: 51 U/L — ABNORMAL HIGH (ref 0–44)
AST: 58 U/L — ABNORMAL HIGH (ref 15–41)
Albumin: 4.4 g/dL (ref 3.5–5.0)
Alkaline Phosphatase: 93 U/L (ref 38–126)
Anion gap: 13 (ref 5–15)
BUN: 11 mg/dL (ref 6–20)
CO2: 21 mmol/L — ABNORMAL LOW (ref 22–32)
Calcium: 9.1 mg/dL (ref 8.9–10.3)
Chloride: 107 mmol/L (ref 98–111)
Creatinine, Ser: 0.87 mg/dL (ref 0.44–1.00)
GFR calc Af Amer: >60 mL/min (ref >60)
GFR calc non Af Amer: >60 mL/min (ref >60)
Glucose, Bld: 109 mg/dL — ABNORMAL HIGH (ref 70–99)
Potassium: 3.5 mmol/L (ref 3.5–5.1)
Sodium: 141 mmol/L (ref 135–145)
Total Bilirubin: 0.4 mg/dL (ref 0.3–1.2)
Total Protein: 7.4 g/dL (ref 6.5–8.1)

## 2018-10-04 LAB — CBC
HCT: 42.6 % (ref 36.0–46.0)
Hemoglobin: 13.6 g/dL (ref 12.0–15.0)
MCH: 27 pg (ref 26.0–34.0)
MCHC: 31.9 g/dL (ref 30.0–36.0)
MCV: 84.5 fL (ref 80.0–100.0)
Platelets: 382 10*3/uL (ref 150–400)
RBC: 5.04 MIL/uL (ref 3.87–5.11)
RDW: 14.3 % (ref 11.5–15.5)
WBC: 10.7 10*3/uL — ABNORMAL HIGH (ref 4.0–10.5)
nRBC: 0 % (ref 0.0–0.2)

## 2018-10-04 LAB — I-STAT BETA HCG BLOOD, ED (MC, WL, AP ONLY): I-stat hCG, quantitative: <5 m[IU]/mL (ref <5)

## 2018-10-04 LAB — ETHANOL: Alcohol, Ethyl (B): 222 mg/dL — ABNORMAL HIGH (ref <10)

## 2018-10-04 MED ORDER — LORAZEPAM 1 MG PO TABS: 0.0000 mg | ORAL_TABLET | Freq: Two times a day (BID) | ORAL | Status: DC

## 2018-10-04 MED ORDER — LORAZEPAM 2 MG/ML IJ SOLN: 0.0000 mg | Freq: Two times a day (BID) | INTRAMUSCULAR | Status: DC

## 2018-10-04 MED ORDER — LORAZEPAM 1 MG PO TABS: 0.0000 mg | ORAL_TABLET | Freq: Four times a day (QID) | ORAL | Status: DC

## 2018-10-04 MED ORDER — ARIPIPRAZOLE 5 MG PO TABS: 5.0000 mg | ORAL_TABLET | Freq: Every day | ORAL | Status: DC

## 2018-10-04 MED ORDER — TRAZODONE HCL 50 MG PO TABS: 50.0000 mg | ORAL_TABLET | Freq: Every evening | ORAL | Status: DC | PRN

## 2018-10-04 MED ORDER — THIAMINE HCL 100 MG/ML IJ SOLN: 100.0000 mg | Freq: Every day | INTRAMUSCULAR | Status: DC

## 2018-10-04 MED ORDER — ONDANSETRON HCL 4 MG PO TABS: 4.0000 mg | ORAL_TABLET | Freq: Three times a day (TID) | ORAL | Status: DC | PRN

## 2018-10-04 MED ORDER — ACETAMINOPHEN 325 MG PO TABS
650.0000 mg | ORAL_TABLET | ORAL | Status: DC | PRN
  Administered 2018-10-04: 650 mg via ORAL
  Filled 2018-10-04: qty 2

## 2018-10-04 MED ORDER — VITAMIN B-1 100 MG PO TABS: 100.0000 mg | ORAL_TABLET | Freq: Every day | ORAL | Status: DC

## 2018-10-04 MED ORDER — LORAZEPAM 2 MG/ML IJ SOLN: 0.0000 mg | Freq: Four times a day (QID) | INTRAMUSCULAR | Status: DC

## 2018-10-04 MED ORDER — LEVOTHYROXINE SODIUM 75 MCG PO TABS: 75.0000 ug | ORAL_TABLET | Freq: Every day | ORAL | Status: DC

## 2018-10-04 NOTE — Discharge Instructions (Addendum)
Follow up with [facility].
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
## 2018-10-04 NOTE — ED Notes (Signed)
Patient verbalizes understanding of discharge instructions. Opportunity for questioning and answers were provided. Armband removed by staff, pt discharged from ED ambulatory.   

## 2018-12-03 ENCOUNTER — Other Ambulatory Visit: Payer: Self-pay

## 2018-12-03 ENCOUNTER — Ambulatory Visit
Admission: RE | Admit: 2018-12-03 | Discharge: 2018-12-03 | Disposition: A | Discharge status: Home / Self Care | Payer: Medicaid Other | Source: Ambulatory Visit | Attending: Internal Medicine | Admitting: Internal Medicine

## 2018-12-03 DIAGNOSIS — Z1231 Encounter for screening mammogram for malignant neoplasm of breast: Secondary | ICD-10-CM

## 2018-12-04 ENCOUNTER — Ambulatory Visit (HOSPITAL_COMMUNITY)
Admission: RE | Admit: 2018-12-04 | Discharge: 2018-12-04 | Disposition: A | Discharge status: Home / Self Care | Payer: Medicaid Other | Source: Ambulatory Visit | Attending: Internal Medicine | Admitting: Internal Medicine

## 2018-12-04 DIAGNOSIS — R740 Nonspecific elevation of levels of transaminase and lactic acid dehydrogenase [LDH]: Secondary | ICD-10-CM | POA: Diagnosis present

## 2018-12-04 DIAGNOSIS — R7401 Elevation of levels of liver transaminase levels: Secondary | ICD-10-CM

## 2019-01-01 ENCOUNTER — Other Ambulatory Visit: Payer: Self-pay | Admitting: Student in an Organized Health Care Education/Training Program

## 2019-01-01 DIAGNOSIS — E890 Postprocedural hypothyroidism: Secondary | ICD-10-CM

## 2019-01-09 ENCOUNTER — Other Ambulatory Visit: Payer: Self-pay

## 2019-01-09 NOTE — Telephone Encounter (Signed)
Called pt - informed Levothyroxine was refilled on 7/6 and to call the pharmacy. Stated ok.

## 2019-01-09 NOTE — Telephone Encounter (Signed)
levothyroxine (SYNTHROID) 75 MCG tablet   , REFILL REQUEST @  [facility] ([place]), [place] - [address] [phone] (Phone) [phone] (Fax)

## 2019-01-09 NOTE — Telephone Encounter (Signed)
Called pharm, it has been ready for pick up per pharm tech for 5 days, tried to call pt, unable to leave vmail, tried both #s

## 2019-01-19 ENCOUNTER — Encounter: Payer: Self-pay | Admitting: Internal Medicine

## 2019-01-19 ENCOUNTER — Other Ambulatory Visit: Payer: Self-pay

## 2019-01-19 ENCOUNTER — Ambulatory Visit (INDEPENDENT_AMBULATORY_CARE_PROVIDER_SITE_OTHER): Payer: Medicaid Other | Admitting: Internal Medicine

## 2019-01-19 VITALS — BP 123/60 | HR 84 | Temp 98.2°F | Ht 61.0 in | Wt 187.9 lb

## 2019-01-19 DIAGNOSIS — Z7289 Other problems related to lifestyle: Secondary | ICD-10-CM | POA: Diagnosis not present

## 2019-01-19 DIAGNOSIS — Z79890 Hormone replacement therapy (postmenopausal): Secondary | ICD-10-CM

## 2019-01-19 DIAGNOSIS — K769 Liver disease, unspecified: Secondary | ICD-10-CM

## 2019-01-19 DIAGNOSIS — E890 Postprocedural hypothyroidism: Secondary | ICD-10-CM | POA: Diagnosis present

## 2019-01-19 DIAGNOSIS — E785 Hyperlipidemia, unspecified: Secondary | ICD-10-CM

## 2019-01-19 NOTE — Patient Instructions (Addendum)
[name],   We checked your thyroid levels today. Depending on the results we may have to adjust your synthroid dose. We will call you to let you know. We also checked your cholesterol because it was high last time we checked. We may have to start medication if it is high again. Follow up with me in 6 months or sooner if you need to. Call us if you have any questions or concerns.   - Dr. Frederico Hamman

## 2019-01-19 NOTE — Progress Notes (Signed)
   CC: Follow up of hypothyroidism, HLD, and hepatic steatosis   HPI:  [name] is a 58 y.o. year-old female with PMH listed below who presents to clinic for follow up of hypothyroidism, HLD, and hepatic steatosis. Please see problem based assessment and plan for further details.   Past Medical History:  Diagnosis Date  . Cocaine abuse ([place])   . ETOH abuse   . Hypothyroidism   . Unspecified mood (affective) disorder ([facility])    Review of Systems:   Review of Systems  Constitutional: Negative for chills, fever, malaise/fatigue and weight loss.  Respiratory: Negative for cough and shortness of breath.   Cardiovascular: Negative for orthopnea.  Gastrointestinal: Negative for abdominal pain, constipation, nausea and vomiting.  Neurological: Negative for dizziness and headaches.    Physical Exam:  Vitals:   01/19/19 1406  BP: 123/60  Pulse: 84  Temp: 98.2 F (36.8 C)  TempSrc: Oral  SpO2: 96%  Weight: 187 lb 14.4 oz (85.2 kg)  Height: 5\' 1"  (1.549 m)   General: Well-appearing female in no acute distress Cardiac: regular rate and rhythm, nl S1/S2, no murmurs, rubs or gallops Pulm: CTAB, no wheezes or crackles, no increased work of breathing on room air  Abd: soft, NTND, normoactive bowel sounds  Ext: warm and well perfused, no peripheral edema.        Office Visit from 01/19/2019 in [place]  PHQ-9 Total Score  2      Assessment & Plan:   See Encounters Tab for problem based charting.  Patient discussed with Dr. Evette Doffing

## 2019-01-20 LAB — TSH: TSH: 7.69 u[IU]/mL — ABNORMAL HIGH (ref 0.450–4.500)

## 2019-01-20 LAB — LIPID PANEL
Chol/HDL Ratio: 3.8 ratio (ref 0.0–4.4)
Cholesterol, Total: 214 mg/dL — ABNORMAL HIGH (ref 100–199)
HDL: 57 mg/dL (ref >39)
LDL Calculated: 100 mg/dL — ABNORMAL HIGH (ref 0–99)
Triglycerides: 286 mg/dL — ABNORMAL HIGH (ref 0–149)
VLDL Cholesterol Cal: 57 mg/dL — ABNORMAL HIGH (ref 5–40)

## 2019-01-21 ENCOUNTER — Encounter: Payer: Self-pay | Admitting: Internal Medicine

## 2019-01-21 NOTE — Assessment & Plan Note (Signed)
Repeated lipid panel per patient's request. ASCVD 7% --> borderline. Will work on weight loss and better control of hypothyroidism.

## 2019-01-21 NOTE — Assessment & Plan Note (Signed)
Patient reports compliance with home Synthroid 75 MCG daily.  She is 20 lbs up since last seen 5 months ago. She also endorses fatigue. Denies skin changes, constipation, intolerance to low temperatures, hair loss, irritability, and irregular uterine bleeding.  Last TSH 7.2 five months ago. Will repeat today. May need to increase dose based on results.

## 2019-01-21 NOTE — Assessment & Plan Note (Addendum)
Patient was noted to have persistently elevated LFTs during her last clinic visit that was thought to be secondary to hepatic steatosis from chronic alcohol use and fatty infiltration.  Right upper quadrant ultrasound showed hepatic steatosis.  Hepatitis studies were negative.  Platelets and INR normal.  She continues to drink alcohol but has cut down due to financial reasons.  We discussed importance of alcohol cessation and weight loss to prevent further progression of liver disease.  Offered naltrexone once again, patient declined.  We will continue to counsel on alcohol cessation and offer naltrexone future visits.

## 2019-01-22 NOTE — Progress Notes (Signed)
Internal Medicine Clinic Attending  Case discussed with Dr. Santos-Sanchez at the time of the visit.  We reviewed the resident's history and exam and pertinent patient test results.  I agree with the assessment, diagnosis, and plan of care documented in the resident's note.    

## 2019-02-05 ENCOUNTER — Other Ambulatory Visit: Payer: Self-pay | Admitting: Internal Medicine

## 2019-02-05 MED ORDER — LEVOTHYROXINE SODIUM 88 MCG PO TABS: 88.0000 ug | ORAL_TABLET | Freq: Every day | ORAL | 5 refills | Status: DC

## 2019-02-06 ENCOUNTER — Telehealth: Payer: Self-pay | Admitting: *Deleted

## 2019-02-06 NOTE — Telephone Encounter (Signed)
-----   Message from Welford Roche, MD sent at 02/05/2019  6:51 PM EDT ----- Regarding: Lab Results Hello! Could you help me reach out to Ms. Surrat to let her know that her thyroid labs were abnormal (TSH was high) and I increased her Synthroid dose to 88 mcg? I sent a prescription to her pharmacy ([facility] in [place]) for the new dose. I have called her numerous times and left a message for her to call back but I have not heard from her. Thanks!

## 2019-02-06 NOTE — Telephone Encounter (Signed)
Called pt's home /cell numbers - no answer nor vm, unable to leave message.

## 2019-04-17 ENCOUNTER — Other Ambulatory Visit: Payer: Self-pay | Admitting: Internal Medicine

## 2019-04-17 NOTE — Telephone Encounter (Signed)
Called [facility] at [address], they will transfer script from [place] to their store and notify pt

## 2019-04-17 NOTE — Telephone Encounter (Signed)
Needs refill on levothyroxine (SYNTHROID) 88 MCG tablet ;pt contact [place], [place]

## 2019-10-27 ENCOUNTER — Encounter: Payer: Self-pay | Admitting: *Deleted

## 2020-01-15 ENCOUNTER — Other Ambulatory Visit: Payer: Self-pay | Admitting: Internal Medicine

## 2020-01-25 ENCOUNTER — Encounter: Payer: Self-pay | Admitting: Student

## 2020-01-25 ENCOUNTER — Ambulatory Visit (INDEPENDENT_AMBULATORY_CARE_PROVIDER_SITE_OTHER): Payer: Medicaid Other | Admitting: Student

## 2020-01-25 VITALS — BP 119/71 | HR 73 | Temp 97.5°F | Ht 61.0 in | Wt 188.6 lb

## 2020-01-25 DIAGNOSIS — K760 Fatty (change of) liver, not elsewhere classified: Secondary | ICD-10-CM | POA: Diagnosis not present

## 2020-01-25 DIAGNOSIS — F17210 Nicotine dependence, cigarettes, uncomplicated: Secondary | ICD-10-CM

## 2020-01-25 DIAGNOSIS — F17200 Nicotine dependence, unspecified, uncomplicated: Secondary | ICD-10-CM

## 2020-01-25 DIAGNOSIS — Z0000 Encounter for general adult medical examination without abnormal findings: Secondary | ICD-10-CM

## 2020-01-25 DIAGNOSIS — Z131 Encounter for screening for diabetes mellitus: Secondary | ICD-10-CM

## 2020-01-25 DIAGNOSIS — F1010 Alcohol abuse, uncomplicated: Secondary | ICD-10-CM

## 2020-01-25 DIAGNOSIS — E890 Postprocedural hypothyroidism: Secondary | ICD-10-CM | POA: Diagnosis not present

## 2020-01-25 DIAGNOSIS — E785 Hyperlipidemia, unspecified: Secondary | ICD-10-CM

## 2020-01-25 DIAGNOSIS — Z862 Personal history of diseases of the blood and blood-forming organs and certain disorders involving the immune mechanism: Secondary | ICD-10-CM

## 2020-01-25 DIAGNOSIS — D649 Anemia, unspecified: Secondary | ICD-10-CM

## 2020-01-25 DIAGNOSIS — K769 Liver disease, unspecified: Secondary | ICD-10-CM | POA: Diagnosis not present

## 2020-01-25 DIAGNOSIS — E782 Mixed hyperlipidemia: Secondary | ICD-10-CM

## 2020-01-25 DIAGNOSIS — Z23 Encounter for immunization: Secondary | ICD-10-CM | POA: Diagnosis not present

## 2020-01-25 LAB — POCT GLYCOSYLATED HEMOGLOBIN (HGB A1C): Hemoglobin A1C: 6.4 % — AB (ref 4.0–5.6)

## 2020-01-25 LAB — PROTIME-INR
INR: 1.1 (ref 0.8–1.2)
Prothrombin Time: 13.4 seconds (ref 11.4–15.2)

## 2020-01-25 LAB — GLUCOSE, CAPILLARY: Glucose-Capillary: 92 mg/dL (ref 70–99)

## 2020-01-25 MED ORDER — ROSUVASTATIN CALCIUM 10 MG PO TABS: 10.0000 mg | ORAL_TABLET | Freq: Every day | ORAL | 3 refills | Status: DC

## 2020-01-25 NOTE — Assessment & Plan Note (Signed)
A: Patient has had normocytic anemia in the past, not on iron. No active bleeding noted though patient endorses fatigue.   P: Will check CBC.

## 2020-01-25 NOTE — Assessment & Plan Note (Addendum)
A: Patient has not had recent diabetic screening. She will need pap smear in about 3 months.   P: Will check HgbA1c - Patient will require nursing visit in 1 month for 2nd dose Hep B vaccine - Patient will need pap smear at next regular appointment.

## 2020-01-25 NOTE — Assessment & Plan Note (Signed)
A: Last visit 01/19/19 patient had gained 20lbs in 5 months on Synthroid 76mcg daily. TSH was checked and was elevated to 7.690. Synthroid was increased to 61mcg. Patient continues to endorse fatigue today but denies weight change, diarrhea, constipation, hair loss, changes in memory, and heat or cold intolerance. Fatigue may be secondary to hypothyroidism.   P: Will check TSH

## 2020-01-25 NOTE — Patient Instructions (Signed)
Today, we discussed that your fatigue may be due to your thyroid and that you may need adjustments in your Eurthyrox dosing. We will check your thyroid studies today as well as your blood counts, liver and kidney function, cholesterol levels, and glucose levels.   You have also received hepatitis B and hepatitis A vaccinations today.  Today, you received an ultrasound of your abdomen. There was no fluid to suggest liver cirrhosis; however, this could be a risk with heavy alcohol use.   I have prescribed your rosuvastatin 10mg . Please take 1 tablet in the morning with your Abilify, after you have already taken your Euthyrox thyroid medication on an empty stomach, first thing in the morning.   Please don't hesitate to call with questions or concerns at [phone].  Please return in 1 month for nursing visit for hepatitis vaccinations.   Thank you,  Jeralyn Bennett   Hepatitis A; Hepatitis B Vaccine injection What is this medicine? HEPATITIS A VACCINE; HEPATITIS B VACCINE (hep uh TAHY tis A vak SEEN; hep uh TAHY tis B vak SEEN) is a vaccine to protect from an infection with the hepatitis A and B virus. This vaccine does not contain the live viruses. It will not cause a hepatitis infection. This medicine may be used for other purposes; ask your health care provider or pharmacist if you have questions. COMMON BRAND NAME(S): Twinrix What should I tell my health care provider before I take this medicine? They need to know if you have any of these conditions:  bleeding disorder  fever or infection  heart disease  immune system problems  an unusual or allergic reaction to hepatitis A or B vaccine, neomycin, yeast, thimerosal, other medicines, foods, dyes, or preservatives  pregnant or trying to get pregnant  breast-feeding How should I use this medicine? This vaccine is for injection into a muscle. It is given by a health care professional. A copy of Vaccine Information Statements  will be given before each vaccination. Read this sheet carefully each time. The sheet may change frequently. Talk to your pediatrician regarding the use of this medicine in children. Special care may be needed. Overdosage: If you think you have taken too much of this medicine contact a poison control center or emergency room at once. NOTE: This medicine is only for you. Do not share this medicine with others. What if I miss a dose? It is important not to miss your dose. Call your doctor or health care professional if you are unable to keep an appointment. What may interact with this medicine?  medicines that suppress your immune function like adalimumab, anakinra, infliximab  medicines to treat cancer  steroid medicines like prednisone or cortisone This list may not describe all possible interactions. Give your health care provider a list of all the medicines, herbs, non-prescription drugs, or dietary supplements you use. Also tell them if you smoke, drink alcohol, or use illegal drugs. Some items may interact with your medicine. What should I watch for while using this medicine? See your health care provider for all shots of this vaccine as directed. You must have 3 to 4 shots of this vaccine for protection from hepatitis A and B infection. Tell your doctor right away if you have any serious or unusual side effects after getting this vaccine. What side effects may I notice from receiving this medicine? Side effects that you should report to your doctor or health care professional as soon as possible:  allergic reactions like skin rash, itching  or hives, swelling of the face, lips, or tongue  breathing problems  confused, irritated  fast, irregular heartbeat  flu-like syndrome  numb, tingling pain  seizures Side effects that usually do not require medical attention (report to your doctor or health care professional if they continue or are  bothersome):  diarrhea  fever  headache  loss of appetite  muscle pain  nausea  pain, redness, swelling, or irritation at site where injected  tiredness This list may not describe all possible side effects. Call your doctor for medical advice about side effects. You may report side effects to FDA at 1-800-FDA-1088. Where should I keep my medicine? This drug is given in a hospital or clinic and will not be stored at home. NOTE: This sheet is a summary. It may not cover all possible information. If you have questions about this medicine, talk to your doctor, pharmacist, or health care provider.  2020 Elsevier/Gold Standard (2007-10-31 15:21:37)

## 2020-01-25 NOTE — Assessment & Plan Note (Signed)
A: Patient has cut back her drinking to a 6-pack of beer on Fridays and Saturdays and endorses headaches but no tremors or seizures or other withdrawal symptoms. She is not interested in quitting at this time.   P: Will continue to encourage cessation vs. naltrexone at future visits.

## 2020-01-25 NOTE — Assessment & Plan Note (Signed)
A: Lipid panel from 01/19/19 showed total cholesterol 214, LDL 100, triglycerides 286, and HDL 57. Patient has never been on a statin. Current ASCVD risk 6.9%.   P: Will repeat lipid panel and start patient on rosuvastatin 10mg  daily.

## 2020-01-25 NOTE — Progress Notes (Signed)
° °  CC: Fatigue  HPI:  [name] is a 59 y.o. female with PMHx post-ablative hypothyroidism, normocytic anemia, HLD, and hepatic steatosis secondary to chronic alcohol use disorder presenting for yearly follow-up with complaint of fatigue. She states that she has been sleeping more during than the day at night. She had previously been taking trazodone 50mg  PRN for sleep, but states that she has not been taking this. She endorses headaches following alcohol use, but denies any changes in appetite or weight change, constipation, diarrhea, hair loss, heat or cold intolerance, troubles with memory, tremors, seizures, nausea, vomiting, abdominal pain, light-headedness, or dizziness. She endorses chronic abdominal swelling and states this has not changed recently.   Past Medical History:  Diagnosis Date   Cocaine abuse ([place])    ETOH abuse    Hypothyroidism    Unspecified mood (affective) disorder ([place])    PSHx: Patient does not currently work. She lives at home and enjoys relaxing in her free time. She drinks a 6-pack of beer on Friday and Saturday and denies alcohol use the remaining days of the week. She previously smoked 0.5 PPD and has cut back to 3-4 cigarettes per week. No other drug use or history of IV drug use.   Review of Systems:  All others negative except as noted in HPI. Specifically no depressed mood.   Vitals:   01/25/20 1045  BP: 119/71  Pulse: 73  Temp: (!) 97.5 F (36.4 C)  TempSrc: Oral  SpO2: 97%  Weight: 188 lb 9.6 oz (85.5 kg)  Height: 5\' 1"  (1.549 m)   Physical Exam: Constitutional: Patient appears tired but well. No acute distress. Eyes: Scleral icterus is present. There is mild proptosis. EOMI bilaterally. No conjunctival injection.  HENT: Moist mucus membranes. No mucosal pallor.  Respiratory: Lungs are clear to auscultation, bilaterally. No wheezes, rales, or rhonchi. Cardiovascular: Regular rate and rhythm. No murmurs, rubs, or  gallops. Abdominal: Abdomen is distended but non-tender without rebound or guarding. Bowel sounds intact.  Skin: Skin along bilateral shins is dry with scaling but without erythema or other lesions. No jaundice.  Psych: Patient describes mood as "good". Normal range of affect.   POC US Abdomen - performed at bedside showed no fluid accumulation within the abdomen and no obvious contour irregularities of the liver.  Assessment & Plan:   See Encounters Tab for problem based charting.  Patient seen with Dr. Heber Copperton.  Jeralyn Bennett, PGY1 [facility]  Pager: [phone]

## 2020-01-25 NOTE — Assessment & Plan Note (Signed)
A: Patient has cut back from 0.5PPD to 3-4 cigarettes per week. She is not interested in quitting at this time.   P: Will continue to encourage and assess motivation for cessation at future visits.

## 2020-01-25 NOTE — Assessment & Plan Note (Addendum)
A: Patient has a history of chronic alcohol use disorder with transaminitis and hepatic steatosis noted on RUQ ultrasound in the past. She continues to drink a 6-pack of beer on Fridays and Saturdays. She was offered Naltrexone but is not interested in alcohol cessation at this time. Abdominal and RUQ POC [place] today demonstrated no fluid accumulation within the abdomen and no obvious contour irregularities of the liver to suggest cirrhosis. No ascites.   P: Will check CBC for thrombocytopenia. - Will check CMP for hypoalbuminemia / elevated LFT's  - Will check PT/INR  - Today, patient received first dose of hepatitis B and hepatitis A vaccines; will have patient return in 1 month for nurse visit to receive 2nd dose hep B, and then again in 6 months for regular follow up and for her Hep A vaccine. - Will continue to counsel on alcohol cessation at future visits.

## 2020-01-26 ENCOUNTER — Other Ambulatory Visit: Payer: Self-pay | Admitting: Student

## 2020-01-26 ENCOUNTER — Telehealth: Payer: Self-pay | Admitting: Student

## 2020-01-26 LAB — CBC WITH DIFFERENTIAL/PLATELET
Basophils Absolute: 0 10*3/uL (ref 0.0–0.2)
Basos: 0 %
EOS (ABSOLUTE): 0.1 10*3/uL (ref 0.0–0.4)
Eos: 2 %
Hematocrit: 40.1 % (ref 34.0–46.6)
Hemoglobin: 13.5 g/dL (ref 11.1–15.9)
Immature Grans (Abs): 0 10*3/uL (ref 0.0–0.1)
Immature Granulocytes: 0 %
Lymphocytes Absolute: 2.6 10*3/uL (ref 0.7–3.1)
Lymphs: 38 %
MCH: 29.1 pg (ref 26.6–33.0)
MCHC: 33.7 g/dL (ref 31.5–35.7)
MCV: 86 fL (ref 79–97)
Monocytes Absolute: 0.3 10*3/uL (ref 0.1–0.9)
Monocytes: 4 %
Neutrophils Absolute: 3.8 10*3/uL (ref 1.4–7.0)
Neutrophils: 56 %
Platelets: 300 10*3/uL (ref 150–450)
RBC: 4.64 x10E6/uL (ref 3.77–5.28)
RDW: 13.6 % (ref 11.7–15.4)
WBC: 6.8 10*3/uL (ref 3.4–10.8)

## 2020-01-26 LAB — CMP14 + ANION GAP
ALT: 54 IU/L — ABNORMAL HIGH (ref 0–32)
AST: 51 IU/L — ABNORMAL HIGH (ref 0–40)
Albumin/Globulin Ratio: 1.9 (ref 1.2–2.2)
Albumin: 4.4 g/dL (ref 3.8–4.9)
Alkaline Phosphatase: 94 IU/L (ref 48–121)
Anion Gap: 15 mmol/L (ref 10.0–18.0)
BUN/Creatinine Ratio: 13 (ref 9–23)
BUN: 12 mg/dL (ref 6–24)
Bilirubin Total: 0.4 mg/dL (ref 0.0–1.2)
CO2: 22 mmol/L (ref 20–29)
Calcium: 9.1 mg/dL (ref 8.7–10.2)
Chloride: 104 mmol/L (ref 96–106)
Creatinine, Ser: 0.89 mg/dL (ref 0.57–1.00)
GFR calc Af Amer: 83 mL/min/{1.73_m2} (ref >59)
GFR calc non Af Amer: 72 mL/min/{1.73_m2} (ref >59)
Globulin, Total: 2.3 g/dL (ref 1.5–4.5)
Glucose: 90 mg/dL (ref 65–99)
Potassium: 3.8 mmol/L (ref 3.5–5.2)
Sodium: 141 mmol/L (ref 134–144)
Total Protein: 6.7 g/dL (ref 6.0–8.5)

## 2020-01-26 LAB — LIPID PANEL
Chol/HDL Ratio: 3.7 ratio (ref 0.0–4.4)
Cholesterol, Total: 194 mg/dL (ref 100–199)
HDL: 52 mg/dL (ref >39)
LDL Chol Calc (NIH): 106 mg/dL — ABNORMAL HIGH (ref 0–99)
Triglycerides: 209 mg/dL — ABNORMAL HIGH (ref 0–149)
VLDL Cholesterol Cal: 36 mg/dL (ref 5–40)

## 2020-01-26 LAB — TSH: TSH: 2.21 u[IU]/mL (ref 0.450–4.500)

## 2020-01-26 MED ORDER — METFORMIN HCL ER 500 MG PO TB24: ORAL_TABLET | ORAL | 11 refills | Status: DC

## 2020-01-26 NOTE — Progress Notes (Signed)
Internal Medicine Clinic Attending  I saw and evaluated the patient.  I personally confirmed the key portions of the history and exam documented by Dr. Speakman and I reviewed pertinent patient test results.  The assessment, diagnosis, and plan were formulated together and I agree with the documentation in the resident's note.  

## 2020-01-26 NOTE — Telephone Encounter (Signed)
Spoke with patient and informed her that her HbA1c is consistent with new-onset diabetes and that I will start her on Metformin up-titration. She is agreeable with this plan. She states she has not yet picked up her rosuvastatin. TSH and CBC WNL with mild transaminitis on CMP, likely from chronic alcohol use disorder. Patient denies any questions or concerns at this time.  Jeralyn Bennett, PGY1 Internal Medicine Pager: [phone]

## 2020-01-26 NOTE — Addendum Note (Signed)
Addended by: Harvie Heck on: 01/26/2020 04:58 PM   Modules accepted: Orders

## 2020-04-10 ENCOUNTER — Other Ambulatory Visit: Payer: Self-pay | Admitting: Internal Medicine

## 2020-06-27 ENCOUNTER — Other Ambulatory Visit: Payer: Self-pay | Admitting: Internal Medicine

## 2020-08-10 ENCOUNTER — Ambulatory Visit (INDEPENDENT_AMBULATORY_CARE_PROVIDER_SITE_OTHER): Payer: Medicaid Other | Admitting: Internal Medicine

## 2020-08-10 ENCOUNTER — Other Ambulatory Visit: Payer: Self-pay

## 2020-08-10 ENCOUNTER — Encounter: Payer: Self-pay | Admitting: Internal Medicine

## 2020-08-10 VITALS — BP 138/92 | HR 66 | Temp 97.8°F | Ht 61.0 in | Wt 182.4 lb

## 2020-08-10 DIAGNOSIS — F1010 Alcohol abuse, uncomplicated: Secondary | ICD-10-CM | POA: Diagnosis not present

## 2020-08-10 DIAGNOSIS — Z0000 Encounter for general adult medical examination without abnormal findings: Secondary | ICD-10-CM

## 2020-08-10 DIAGNOSIS — E118 Type 2 diabetes mellitus with unspecified complications: Secondary | ICD-10-CM | POA: Insufficient documentation

## 2020-08-10 DIAGNOSIS — E782 Mixed hyperlipidemia: Secondary | ICD-10-CM | POA: Diagnosis not present

## 2020-08-10 DIAGNOSIS — E890 Postprocedural hypothyroidism: Secondary | ICD-10-CM

## 2020-08-10 DIAGNOSIS — K760 Fatty (change of) liver, not elsewhere classified: Secondary | ICD-10-CM

## 2020-08-10 MED ORDER — METFORMIN HCL ER 500 MG PO TB24: 1000.0000 mg | ORAL_TABLET | Freq: Two times a day (BID) | ORAL | 11 refills | Status: DC

## 2020-08-10 MED ORDER — LEVOTHYROXINE SODIUM 88 MCG PO TABS: 88.0000 ug | ORAL_TABLET | Freq: Every day | ORAL | 3 refills | Status: DC

## 2020-08-10 MED ORDER — ROSUVASTATIN CALCIUM 10 MG PO TABS: 10.0000 mg | ORAL_TABLET | Freq: Every day | ORAL | 3 refills | Status: DC

## 2020-08-10 NOTE — Assessment & Plan Note (Signed)
Sending refill for Levo thyroxin 88 mcg QD. Patient refuses blood work today. Will check TSh next visit.

## 2020-08-10 NOTE — Patient Instructions (Signed)
Thank you for allowing [place] to provide your care today. Today we discussed the importance of decreasing alcohol intake and available medications for that and also talked about your hypothyroidism.  I send refill for your medications. Please make sure to follow up with your PCP in 3 months and call us before you run out of your medications.  Per your preference, we do not do blood work today and will obtain it next visit.   Today we made no changes to your medications.    Please come back to clinic in 3 months to follow up with your PCP or earlier if needed. Should you have any questions or concerns please call the internal medicine clinic at [phone].    Thank you!

## 2020-08-10 NOTE — Assessment & Plan Note (Signed)
Sending refill for Crestor 10 mg QD today.

## 2020-08-10 NOTE — Progress Notes (Signed)
Acute Office Visit  Subjective:    Patient ID: [name], female    DOB: July 25, 1960, 60 y.o.   MRN: [id number]  Chief Complaint  Patient presents with  . Medication Refill    Needs DM refils    HPI Patient is in today for medication refill.  PMHX: post-ablative hypothyroidism, normocytic anemia, HLD, and chronic liver disease (hepatic steatosis secondary to chronic alcohol use),  alcohol induced mood disorders, osteoarthritis   Hypothyroidism: TSH 7.26/2021 was normal at 2.21. On Euthyrox 88 mcg a day. Denies symptoms. Will recheck TSH next visit per pt preference (she does not want any lab today).   Past Medical History:  Diagnosis Date  . Cocaine abuse ([place])   . ETOH abuse   . Hypothyroidism   . Unspecified mood (affective) disorder [facility])     Past Surgical History:  Procedure Laterality Date  . radioactive iodine ablation     graves disease s/p  . TOTAL KNEE ARTHROPLASTY Right 05/09/2018   Procedure: TOTAL KNEE ARTHROPLASTY;  Surgeon: Dorna Leitz, MD;  Location: [facility];  Service: Orthopedics;  Laterality: Right;    No family history on file.  Social History   Socioeconomic History  . Marital status: Single    Spouse name: Not on file  . Number of children: Not on file  . Years of education: Not on file  . Highest education level: Not on file  Occupational History  . Not on file  Tobacco Use  . Smoking status: Current Every Day Smoker    Packs/day: 0.30    Types: Cigarettes  . Smokeless tobacco: Never Used  . Tobacco comment: 2-3 cigs / day  Vaping Use  . Vaping Use: Never used  Substance and Sexual Activity  . Alcohol use: Yes    Alcohol/week: 0.0 standard drinks    Comment: weekend-04/25/2018  . Drug use: Yes    Types: Marijuana, "Crack" cocaine    Comment: crack-last week 04/21/2018  . Sexual activity: Not on file  Other Topics Concern  . Not on file  Social History Narrative  . Not on file   Social Determinants of Health    Financial Resource Strain: Not on file  Food Insecurity: Not on file  Transportation Needs: Not on file  Physical Activity: Not on file  Stress: Not on file  Social Connections: Not on file  Intimate Partner Violence: Not on file    Outpatient Medications Prior to Visit  Medication Sig Dispense Refill  . ARIPiprazole (ABILIFY) 5 MG tablet Take 5 mg by mouth daily.    Arna Medici 88 MCG tablet Take 1 tablet by mouth once daily 90 tablet 0  . metFORMIN (GLUCOPHAGE XR) 500 MG 24 hr tablet Take 1 tablet daily with dinner for 1 week Take 1 tablet with breakfast and 1 tablet with dinner for 1 week Take 2 tablets with breakfast and 1 tablet with dinner for 1 week Take 2 tablets with breakfast and 2 tablets with dinner until next visit 30 tablet 11  . rosuvastatin (CRESTOR) 10 MG tablet Take 1 tablet (10 mg total) by mouth daily. 30 tablet 3   No facility-administered medications prior to visit.    No Known Allergies  Review of Systems     Objective:    Physical Exam  BP (!) 138/92 (BP Location: Right Arm)   Pulse 66   Temp 97.8 F (36.6 C)   Ht 5\' 1"  (1.549 m)   Wt 182 lb 6.4 oz (82.7 kg)  SpO2 98%   BMI 34.46 kg/m  Wt Readings from Last 3 Encounters:  08/10/20 182 lb 6.4 oz (82.7 kg)  01/25/20 188 lb 9.6 oz (85.5 kg)  01/19/19 187 lb 14.4 oz (85.2 kg)   Constitutional: Well-developed and well-nourished. No acute distress.  HENT:  Head: Normocephalic and atraumatic.  Eyes: Conjunctivae are normal, EOM nl Cardiovascular:  RRR, nl S1S2, no murmur,  no LEE Respiratory: Effort normal and breath sounds normal. No respiratory distress. No wheezes.  GI: Soft. Bowel sounds are normal. No distension. There is no tenderness.  Neurological: Is alert and oriented x 3  Skin: Not diaphoretic. No erythema.  Psychiatric: Normal mood and affect. Behavior is normal. Judgment and thought content normal.    Health Maintenance Due  Topic Date Due  . PNEUMOCOCCAL POLYSACCHARIDE VACCINE  AGE 108-64 HIGH RISK  Never done  . FOOT EXAM  Never done  . OPHTHALMOLOGY EXAM  Never done  . COVID-19 Vaccine (1) Never done  . COLON CANCER SCREENING ANNUAL FOBT  03/02/2016  . PAP SMEAR-Modifier  04/20/2020  . HEMOGLOBIN A1C  07/27/2020    There are no preventive care reminders to display for this patient.   Lab Results  Component Value Date   TSH 2.210 01/25/2020   Lab Results  Component Value Date   WBC 6.8 01/25/2020   HGB 13.5 01/25/2020   HCT 40.1 01/25/2020   MCV 86 01/25/2020   PLT 300 01/25/2020   Lab Results  Component Value Date   NA 141 01/25/2020   K 3.8 01/25/2020   CO2 22 01/25/2020   GLUCOSE 90 01/25/2020   BUN 12 01/25/2020   CREATININE 0.89 01/25/2020   BILITOT 0.4 01/25/2020   ALKPHOS 94 01/25/2020   AST 51 (H) 01/25/2020   ALT 54 (H) 01/25/2020   PROT 6.7 01/25/2020   ALBUMIN 4.4 01/25/2020   CALCIUM 9.1 01/25/2020   ANIONGAP 13 10/04/2018   Lab Results  Component Value Date   CHOL 194 01/25/2020   Lab Results  Component Value Date   HDL 52 01/25/2020   Lab Results  Component Value Date   LDLCALC 106 (H) 01/25/2020   Lab Results  Component Value Date   TRIG 209 (H) 01/25/2020   Lab Results  Component Value Date   CHOLHDL 3.7 01/25/2020   Lab Results  Component Value Date   HGBA1C 6.4 (A) 01/25/2020       Assessment & Plan:   Problem List Items Addressed This Visit      Endocrine   Controlled type 2 diabetes mellitus with complication, without long-term current use of insulin ([place])    Patient was started on Metformin 500 mg QD to uptitrate to 1000 mg BID. Not sure if she had enough pills and she is not sure if she takes 1 or 2 tablest BID now. She states that she needs refill on it.  -Sending refill for Metformin 1000 mg BID -F/u in clinic in 3 months for HbA1c       Relevant Medications   metFORMIN (GLUCOPHAGE XR) 500 MG 24 hr tablet   rosuvastatin (CRESTOR) 10 MG tablet   Postablative hypothyroidism - Primary     Sending refill for Levo thyroxin 88 mcg QD. Patient refuses blood work today. Will check TSh next visit.      Relevant Medications   levothyroxine (EUTHYROX) 88 MCG tablet     Other   Alcohol use disorder, mild, abuse    Although she cut down on amount of  alcoloho and limited alcohol drinking only to weekned, she still drinks up to15 packs on weekend. Last CMp showed stable LFTs. We discussed the effect of binge drinking and importance of cutting down the alcohol drinking. She verbalizes understanding. She is not intrested in staring Naltrexone.      Health care maintenance    Hep B vaccination was not completed. She agreed to receive it today but then refused. Recommending to get another set of hep B versus checking the Ab level next time.      Hyperlipidemia    Sending refill for Crestor 10 mg QD today.       Relevant Medications   rosuvastatin (CRESTOR) 10 MG tablet    Other Visit Diagnoses    Hepatic steatosis            Meds ordered this encounter  Medications  . metFORMIN (GLUCOPHAGE XR) 500 MG 24 hr tablet    Sig: Take 2 tablets (1,000 mg total) by mouth 2 (two) times daily with a meal.    Dispense:  120 tablet    Refill:  11  . rosuvastatin (CRESTOR) 10 MG tablet    Sig: Take 1 tablet (10 mg total) by mouth daily.    Dispense:  90 tablet    Refill:  3  . levothyroxine (EUTHYROX) 88 MCG tablet    Sig: Take 1 tablet (88 mcg total) by mouth daily.    Dispense:  90 tablet    Refill:  3     Matea Stanard, MD

## 2020-08-10 NOTE — Assessment & Plan Note (Signed)
Hep B vaccination was not completed. She agreed to receive it today but then refused. Recommending to get another set of hep B versus checking the Ab level next time.

## 2020-08-10 NOTE — Assessment & Plan Note (Signed)
Patient was started on Metformin 500 mg QD to uptitrate to 1000 mg BID. Not sure if she had enough pills and she is not sure if she takes 1 or 2 tablest BID now. She states that she needs refill on it.  -Sending refill for Metformin 1000 mg BID -F/u in clinic in 3 months for HbA1c

## 2020-08-10 NOTE — Assessment & Plan Note (Signed)
Although she cut down on amount of alcoloho and limited alcohol drinking only to weekned, she still drinks up to15 packs on weekend. Last CMp showed stable LFTs. We discussed the effect of binge drinking and importance of cutting down the alcohol drinking. She verbalizes understanding. She is not intrested in staring Naltrexone.

## 2020-08-12 NOTE — Progress Notes (Signed)
Internal Medicine Clinic Attending  Case discussed with Dr. Masoudi  At the time of the visit.  We reviewed the resident's history and exam and pertinent patient test results.  I agree with the assessment, diagnosis, and plan of care documented in the resident's note.  

## 2020-11-18 IMAGING — US ULTRASOUND ABDOMEN LIMITED
1 series · 14 of 25 positions shown · non-contrast
Comparison: PET-CT July 12, 2010

CLINICAL DATA: Elevated liver enzymes

EXAM:
ULTRASOUND ABDOMEN LIMITED RIGHT UPPER QUADRANT

[Series 1: ultrasound abdomen limited · 0.19mm/px · 14 of 43 slices shown]
[im 1/43]
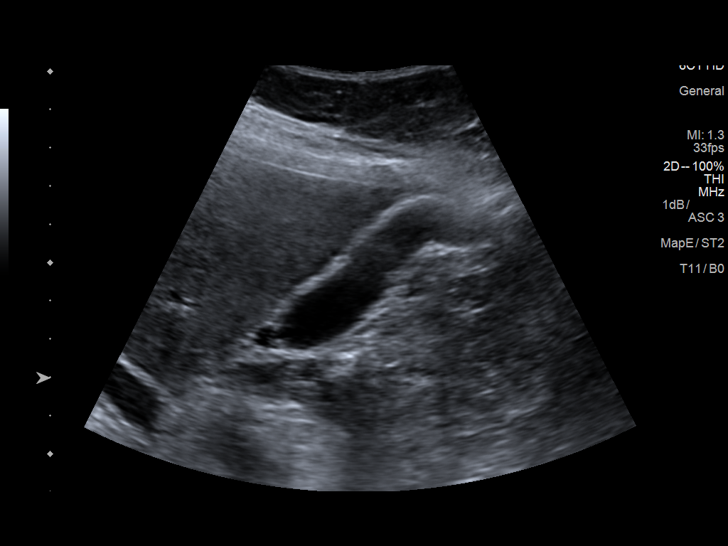
[im 4/43]
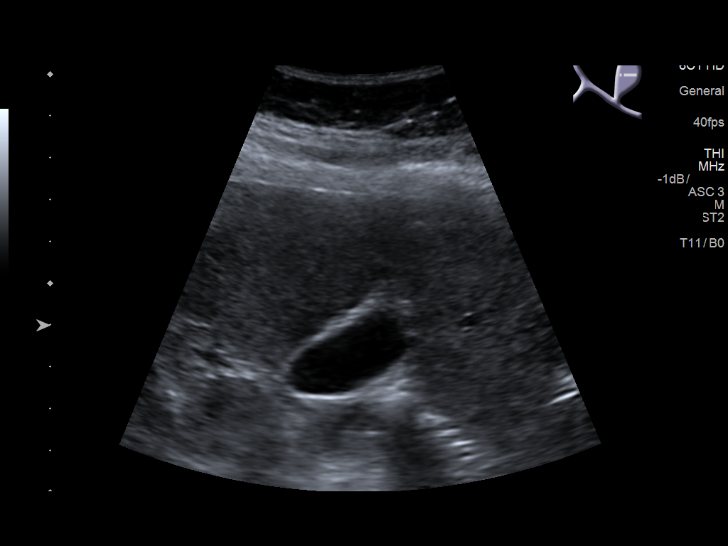
[im 8/43]
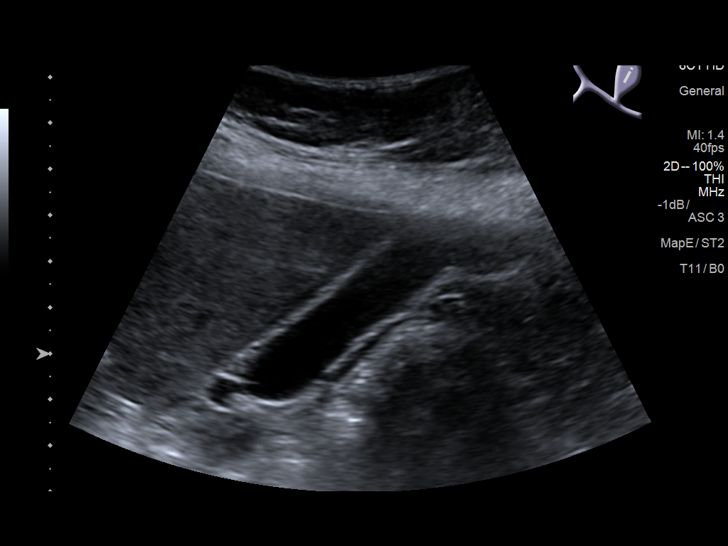
[im 11/43]
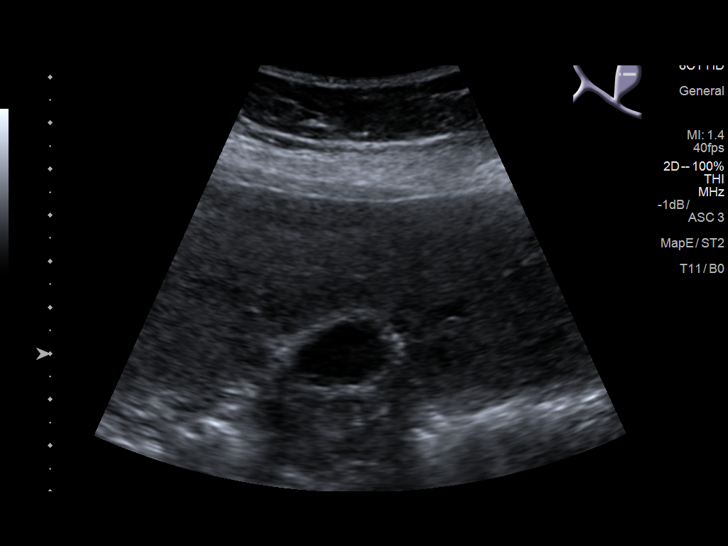
[im 15/43]
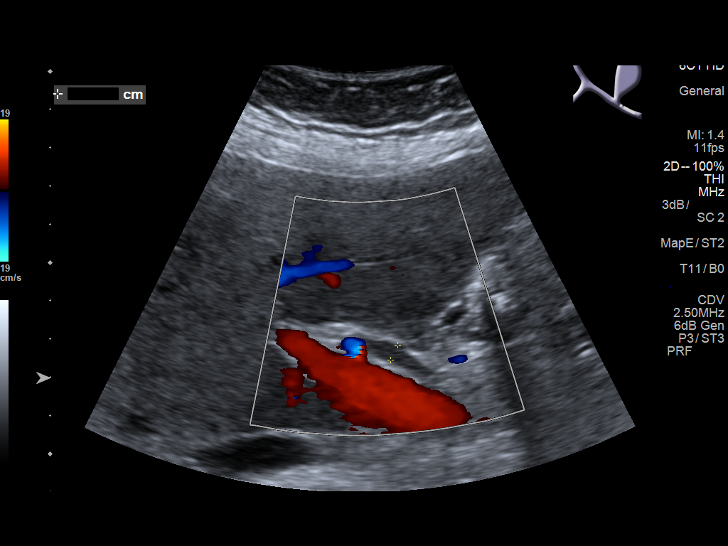
[im 16/43]
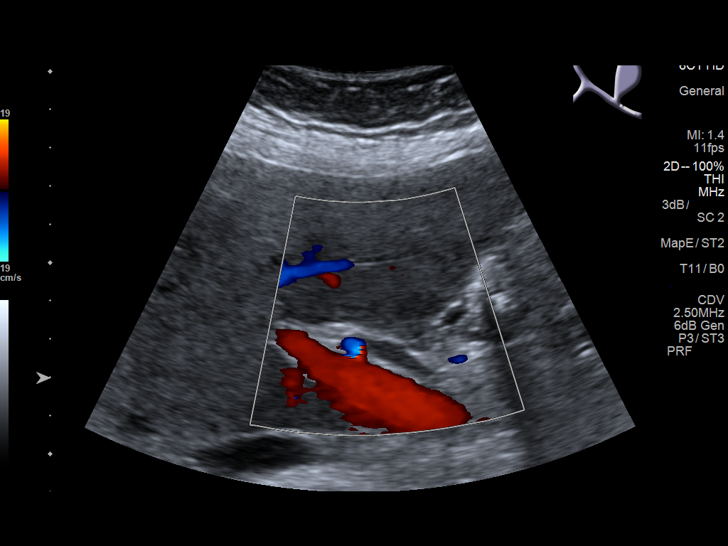
[im 20/43]
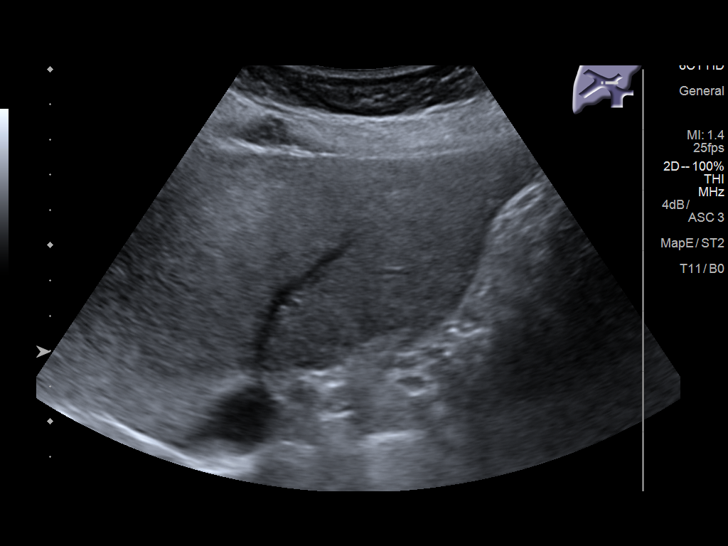
[im 23/43]
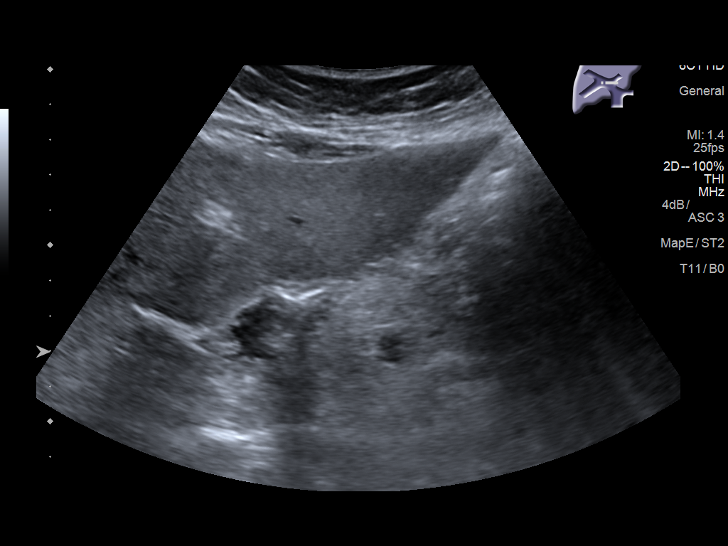
[im 27/43]
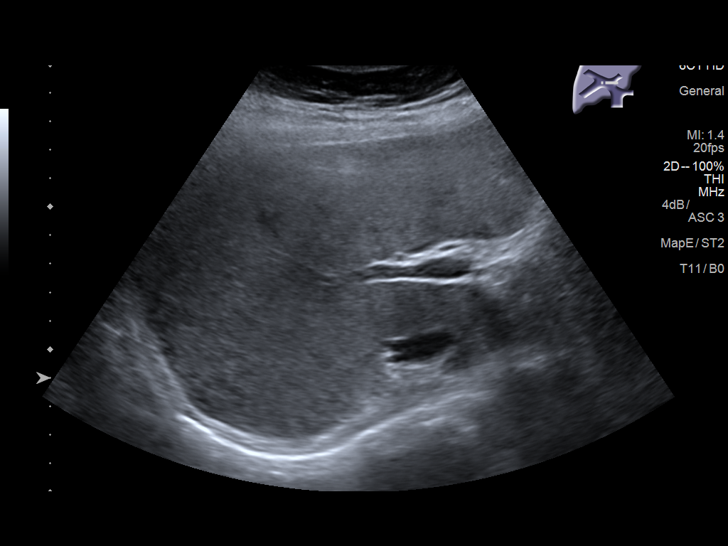
[im 29/43]
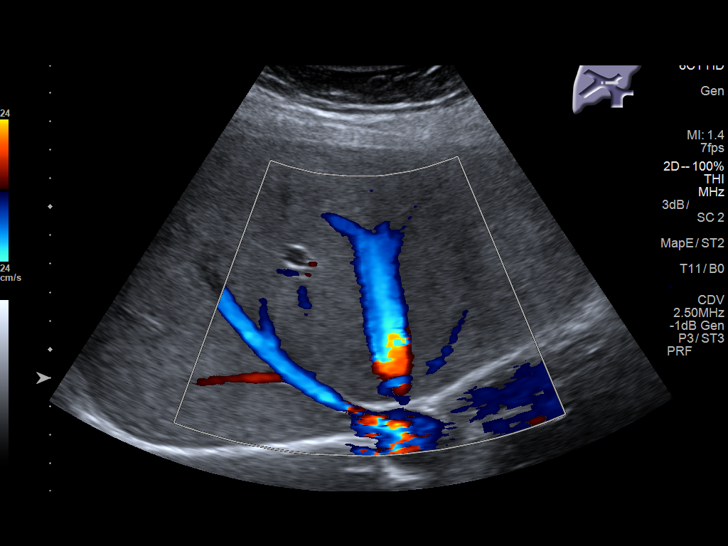
[im 32/43]
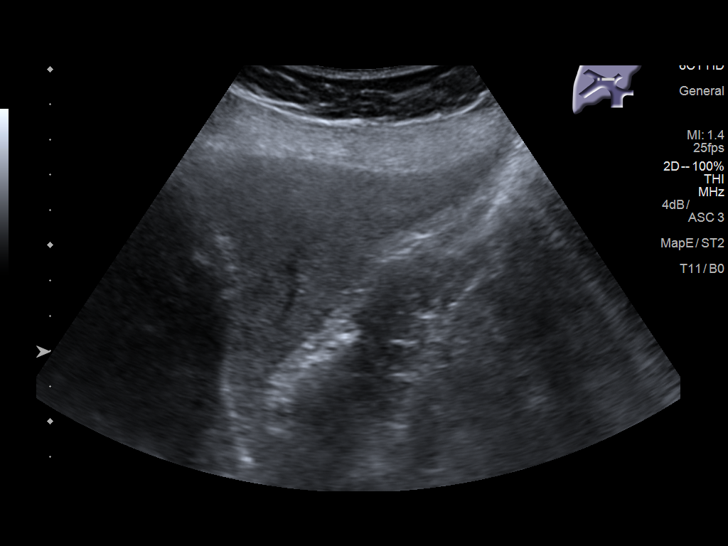
[im 36/43]
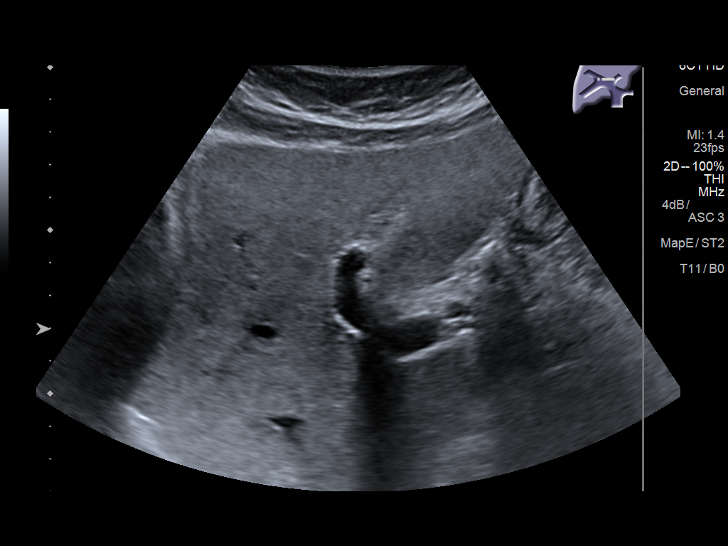
[im 39/43]
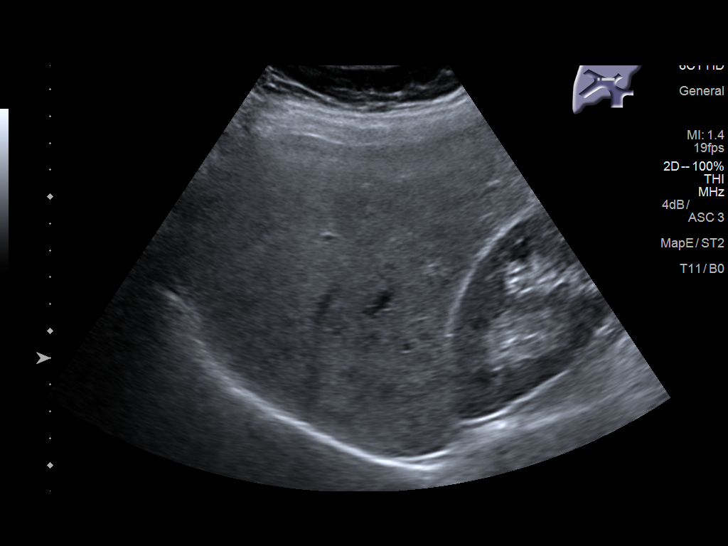
[im 43/43]
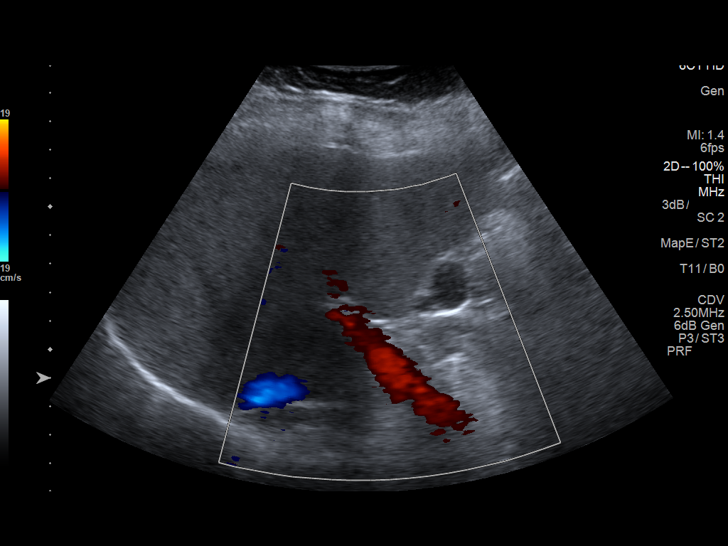

[14 of 25 positions shown; findings below may reference images not displayed]

FINDINGS: Gallbladder:

No gallstones or wall thickening visualized. There is no
pericholecystic fluid. No sonographic Murphy sign noted by
sonographer.

Common bile duct:

Diameter: 4 mm. No intrahepatic or extrahepatic biliary duct
dilatation.

Liver:

No focal lesion identified. Liver echogenicity is increased
diffusely. Portal vein is patent on color Doppler imaging with
normal direction of blood flow towards the liver.
IMPRESSION: 1. Diffuse increase in liver echogenicity, a finding indicative of
hepatic steatosis. No focal liver lesions evident. It should be
noted that the sensitivity of ultrasound for detection of focal
liver lesions is somewhat diminished in this circumstance.

2.  Study otherwise unremarkable.

## 2020-11-24 ENCOUNTER — Encounter: Payer: Self-pay | Admitting: *Deleted

## 2021-08-18 ENCOUNTER — Other Ambulatory Visit: Payer: Self-pay

## 2021-08-18 DIAGNOSIS — E890 Postprocedural hypothyroidism: Secondary | ICD-10-CM

## 2021-08-19 MED ORDER — LEVOTHYROXINE SODIUM 88 MCG PO TABS: 88.0000 ug | ORAL_TABLET | Freq: Every day | ORAL | 3 refills | Status: DC

## 2021-09-14 ENCOUNTER — Other Ambulatory Visit: Payer: Self-pay

## 2021-09-25 MED ORDER — METFORMIN HCL ER 500 MG PO TB24: 1000.0000 mg | ORAL_TABLET | Freq: Two times a day (BID) | ORAL | 11 refills | Status: DC

## 2021-09-27 ENCOUNTER — Encounter: Payer: Medicaid Other | Admitting: Internal Medicine

## 2021-10-03 ENCOUNTER — Encounter: Payer: Medicaid Other | Admitting: Internal Medicine

## 2021-10-04 ENCOUNTER — Encounter: Payer: Self-pay | Admitting: Internal Medicine

## 2021-10-11 ENCOUNTER — Ambulatory Visit (INDEPENDENT_AMBULATORY_CARE_PROVIDER_SITE_OTHER): Payer: Medicaid Other | Admitting: Internal Medicine

## 2021-10-11 ENCOUNTER — Other Ambulatory Visit: Payer: Self-pay

## 2021-10-11 ENCOUNTER — Encounter: Payer: Self-pay | Admitting: Internal Medicine

## 2021-10-11 VITALS — BP 125/94 | HR 81 | Temp 97.8°F | Ht 61.0 in | Wt 181.4 lb

## 2021-10-11 DIAGNOSIS — E890 Postprocedural hypothyroidism: Secondary | ICD-10-CM | POA: Diagnosis not present

## 2021-10-11 DIAGNOSIS — K769 Liver disease, unspecified: Secondary | ICD-10-CM | POA: Diagnosis not present

## 2021-10-11 DIAGNOSIS — E118 Type 2 diabetes mellitus with unspecified complications: Secondary | ICD-10-CM

## 2021-10-11 DIAGNOSIS — Z0000 Encounter for general adult medical examination without abnormal findings: Secondary | ICD-10-CM

## 2021-10-11 LAB — POCT GLYCOSYLATED HEMOGLOBIN (HGB A1C): Hemoglobin A1C: 5.9 % — AB (ref 4.0–5.6)

## 2021-10-11 LAB — GLUCOSE, CAPILLARY: Glucose-Capillary: 88 mg/dL (ref 70–99)

## 2021-10-11 NOTE — Assessment & Plan Note (Addendum)
Patient was previously evaluated for this in July 2021 in setting of chronic alcohol use disorder with hepatic steatosis noted on RUQ [place]. Patient endorses ongoing binge-drinking on the weekend with intake of at least a 6-pack of beer on Fridays and Saturdays. She does note frequent black-outs but has not happened "in a while". Patient became defensive and closed off when talking about her alcohol use. She denies any prior history of alcohol withdrawals.  ?Patient is not interested in alcohol counseling or cessation at this time. Advised for follow up CMP given prior abnormal labs; however, patient refuses at this time stating "ain't nothing wrong with it".  ? ?Plan: ?Continue to counsel alcohol cessation and LFT monitoring at future visits ?She has received one dose of Hepatitis B and Hepatitis A vaccines in July 2021; however, does not wish to continue.  ?

## 2021-10-11 NOTE — Progress Notes (Signed)
? ?  CC: diabetes and thyroid follow up  ? ?HPI: ? ?[name] is a 61 y.o. female with PMHx as stated below presenting for follow up of her diabetes and thyroid. She denies any acute concerns at this time. Please see problem based charting for complete assessment and plan. ? ?Past Medical History:  ?Diagnosis Date  ? Cocaine abuse ([place])   ? ETOH abuse   ? Hypothyroidism   ? Unspecified mood (affective) disorder ([place])   ? ?Review of Systems:  Negative except as stated in HPI. ? ?Physical Exam: ? ?Vitals:  ? 10/11/21 0908  ?BP: (!) 125/94  ?Pulse: 81  ?Temp: 97.8 ?F (36.6 ?C)  ?TempSrc: Oral  ?SpO2: 97%  ?Weight: 181 lb 6.4 oz (82.3 kg)  ?Height: '5\' 1"'$  (1.549 m)  ? ?Physical Exam  ?Constitutional: Appears well-developed and well-nourished. No distress.  ?Cardiovascular: Normal rate, regular rhythm, S1 and S2 present, no murmurs, rubs, gallops.  Distal pulses intact ?Respiratory: Lungs are clear to auscultation bilaterally. ?Musculoskeletal: Normal bulk and tone.  No peripheral edema noted. ?Neurological: Is alert and oriented x4, no apparent focal deficits noted. ?Skin: Warm and dry.  No rash, erythema, lesions noted. ?Psychiatric: Normal mood and affect.  ? ?Assessment & Plan:  ? ?See Encounters Tab for problem based charting. ? ?Patient discussed with Dr. Jimmye Norman ? ?

## 2021-10-11 NOTE — Assessment & Plan Note (Deleted)
Chronic and stable. ? ?Plan: ?CMP today ?

## 2021-10-11 NOTE — Patient Instructions (Addendum)
Ms [name],  ? ?It was a pleasure seeing you in clinic. Today we discussed:  ? ?Diabetes: ?Your A1c is 5.9 at this visit, great job! At this time, you may decrease your metformin to once daily. Follow up with your eye doctor for diabetic eye exam. Follow up in 3 months ? ?Thyroid:  ?We are checking on your thyroid levels today. I will call you with any abnormal results. Please continue to take your thyroid medication every morning 8mns before breakfast as prescribed.  ? ?Healthcare maintenance: ?Please pick up a stool kit for colon cancer screening from the lab.  ?Please schedule your mammogram at your earliest convenience ?Pap smear at the next visit.  ? ? ?If you have any questions or concerns, please call our clinic at [phone] 9am-5pm and after hours call [phone] and ask for the internal medicine resident on call. If you feel you are having a medical emergency please call 911.  ? ?Thank you, we look forward to helping you remain healthy! ? ? ?

## 2021-10-12 LAB — TSH: TSH: 1.53 u[IU]/mL (ref 0.450–4.500)

## 2021-10-12 NOTE — Assessment & Plan Note (Signed)
Patient presenting for follow up of hypothyroidism. She is on levothyroxine 38mg daily. She denies any symptoms at this time and reports compliance with medication. She reports taking medication every morning 30 mins before breakfast. TSH wnl.  ? ?Plan: ?Continue synthroid 877m daily  ?

## 2021-10-12 NOTE — Assessment & Plan Note (Signed)
Patient is refusing most lab work at this visit except for thyroid. She is also refusing any vaccination at this time including hepatitis and Tdap. ?Patient refuses pap smear at this time. ?She is agreeable to colon cancer screening with FOBT and mammogram- ordered.  ?

## 2021-10-12 NOTE — Assessment & Plan Note (Addendum)
HbA1c improved to 5.9 at this visit from 6.4. She is currently taking metformin '1000mg'$  twice daily. She denies any side effects and is tolerating well at this time. Patient declined foot exam and urine microalbumin/cr monitoring at this visit.  Given improvement in A1c, can trial decrease of metformin dosing.  ? ?Plan: ?Decrease metformin to '1000mg'$  daily  ?Referral to ophthalmology for diabetic eye exam ?Follow up in 3 months for repeat A1c  ? ?

## 2021-10-25 ENCOUNTER — Other Ambulatory Visit: Payer: Self-pay | Admitting: Internal Medicine

## 2021-10-25 DIAGNOSIS — Z1231 Encounter for screening mammogram for malignant neoplasm of breast: Secondary | ICD-10-CM

## 2021-10-26 NOTE — Progress Notes (Signed)
Internal Medicine Clinic Attending ° °Case discussed with Dr. Aslam  At the time of the visit.  We reviewed the resident’s history and exam and pertinent patient test results.  I agree with the assessment, diagnosis, and plan of care documented in the resident’s note.  °

## 2022-03-15 ENCOUNTER — Other Ambulatory Visit: Payer: Self-pay

## 2022-03-15 DIAGNOSIS — E890 Postprocedural hypothyroidism: Secondary | ICD-10-CM

## 2022-03-16 MED ORDER — LEVOTHYROXINE SODIUM 88 MCG PO TABS: 88.0000 ug | ORAL_TABLET | Freq: Every day | ORAL | 3 refills | Status: DC

## 2022-03-16 NOTE — Telephone Encounter (Signed)
Thanks Lauren!

## 2022-04-11 ENCOUNTER — Encounter: Payer: Medicaid Other | Admitting: Internal Medicine

## 2022-04-11 NOTE — Progress Notes (Deleted)
   CC: med refill, 3 month checkup  HPI:[name] is a 61 y.o. female who presents for evaluation of ***. Please see individual problem based A/P for details.  Diabetes Metformin '1000mg'$  daily - foot - urine -a1c Cbg  Med refill  Flu shot?   Depression, PHQ-9: Based on the patients  [place] Visit from 01/25/2020 in [place]  PHQ-9 Total Score 1      score we have ***.  Past Medical History:  Diagnosis Date   Cocaine abuse ([place])    ETOH abuse    Hypothyroidism    Unspecified mood (affective) disorder ([place])    Review of Systems:   ROS   Physical Exam: There were no vitals filed for this visit.   General: *** HEENT: Conjunctiva nl , antiicteric sclerae, moist mucous membranes, no exudate or erythema Cardiovascular: Normal rate, regular rhythm.  No murmurs, rubs, or gallops Pulmonary : Equal breath sounds, No wheezes, rales, or rhonchi Abdominal: soft, nontender,  bowel sounds present Ext: No edema in lower extremities, no tenderness to palpation of lower extremities.   Assessment & Plan:   See Encounters Tab for problem based charting.  Patient {GC/GE:[id number]::"discussed with","seen with"} Dr. {OMVEH:[id number]::"G. Hoffman","Guilloud","Mullen","Narendra","Raines","Vincent","Williams"}

## 2022-05-01 ENCOUNTER — Other Ambulatory Visit: Payer: Self-pay

## 2022-05-01 ENCOUNTER — Encounter: Payer: Self-pay | Admitting: Internal Medicine

## 2022-05-01 ENCOUNTER — Ambulatory Visit (INDEPENDENT_AMBULATORY_CARE_PROVIDER_SITE_OTHER): Payer: Medicaid Other | Admitting: Internal Medicine

## 2022-05-01 VITALS — BP 129/60 | HR 76 | Temp 97.7°F | Resp 28 | Ht 61.0 in | Wt 164.7 lb

## 2022-05-01 DIAGNOSIS — B351 Tinea unguium: Secondary | ICD-10-CM

## 2022-05-01 DIAGNOSIS — K769 Liver disease, unspecified: Secondary | ICD-10-CM | POA: Diagnosis present

## 2022-05-01 DIAGNOSIS — Z0000 Encounter for general adult medical examination without abnormal findings: Secondary | ICD-10-CM

## 2022-05-01 DIAGNOSIS — E782 Mixed hyperlipidemia: Secondary | ICD-10-CM | POA: Diagnosis not present

## 2022-05-01 DIAGNOSIS — Z7984 Long term (current) use of oral hypoglycemic drugs: Secondary | ICD-10-CM

## 2022-05-01 DIAGNOSIS — E118 Type 2 diabetes mellitus with unspecified complications: Secondary | ICD-10-CM

## 2022-05-01 LAB — POCT GLYCOSYLATED HEMOGLOBIN (HGB A1C): Hemoglobin A1C: 6.2 % — AB (ref 4.0–5.6)

## 2022-05-01 LAB — GLUCOSE, CAPILLARY: Glucose-Capillary: 96 mg/dL (ref 70–99)

## 2022-05-01 MED ORDER — METFORMIN HCL ER 500 MG PO TB24: 500.0000 mg | ORAL_TABLET | Freq: Every day | ORAL | 3 refills | Status: DC

## 2022-05-01 MED ORDER — ROSUVASTATIN CALCIUM 10 MG PO TABS: 10.0000 mg | ORAL_TABLET | Freq: Every day | ORAL | 3 refills | Status: DC

## 2022-05-01 NOTE — Patient Instructions (Addendum)
Thank you, Ms.Dania L Boonstra for allowing [place] to provide your care today.  Diabetes: A1c 6.2 which is great. Keep taking metformin 1 tab a day. I am referring to foot doctor.  Cholesterol: Please take crestor 1 time daily. You are at increased risk of stroke and heart attacks with diabetes and this medication will decrease that risk.  Liver/ alcohol use: I am concerned about your liver. We should get labs at your next visit to check to see how it is doing. Please try to cut down on amount you are drinking. We talked about a medication to decrease craving, I think this would be a good med for you.  I have ordered the following labs for you:  Lab Orders         Glucose, capillary         POC Hbg A1C      Referrals ordered today:   Referral Orders         Ambulatory referral to Podiatry         Ambulatory referral to Ophthalmology      I have ordered the following medication/changed the following medications:   Stop the following medications: Medications Discontinued During This Encounter  Medication Reason   rosuvastatin (CRESTOR) 10 MG tablet Reorder   metFORMIN (GLUCOPHAGE XR) 500 MG 24 hr tablet Reorder     Start the following medications: Meds ordered this encounter  Medications   rosuvastatin (CRESTOR) 10 MG tablet    Sig: Take 1 tablet (10 mg total) by mouth daily.    Dispense:  90 tablet    Refill:  3   metFORMIN (GLUCOPHAGE XR) 500 MG 24 hr tablet    Sig: Take 1 tablet (500 mg total) by mouth daily with breakfast.    Dispense:  90 tablet    Refill:  3     Follow up:  6-8 weeks     We look forward to seeing you next time. Please call our clinic at [phone] if you have any questions or concerns. The best time to call is Monday-Friday from 9am-4pm, but there is someone available 24/7. If after hours or the weekend, call the main hospital number and ask for the Internal Medicine Resident On-Call. If you need medication refills, please notify your pharmacy one  week in advance and they will send [place] a request.   Thank you for trusting me with your care. Wishing you the best!   Christiana Fuchs, [place]

## 2022-05-01 NOTE — Progress Notes (Unsigned)
Subjective:  CC: diabetes management  HPI:  [name] is a 61 y.o. female with a past medical history stated below and presents today for follow-up on diabetes. She takes metformin 500 mg daily without side effects. Last A1c was well controlled 4/23 at 5.9. She is concerned about taking care of her feet as several of her toenails are difficult to cut.   She continues to drink 5-10 drinks of liquor/ beer on Saturday and Sunday each week. She is working on cutting down. Please see problem based assessment and plan for additional details.  Past Medical History:  Diagnosis Date   Cocaine abuse ([place])    ETOH abuse    Hypothyroidism    Unspecified mood (affective) disorder (HCC)     Current Outpatient Medications on File Prior to Visit  Medication Sig Dispense Refill   ARIPiprazole (ABILIFY) 5 MG tablet Take 5 mg by mouth daily.     levothyroxine (EUTHYROX) 88 MCG tablet Take 1 tablet (88 mcg total) by mouth daily. 90 tablet 3   No current facility-administered medications on file prior to visit.    No family history on file.  Social History   Socioeconomic History   Marital status: Single    Spouse name: Not on file   Number of children: Not on file   Years of education: Not on file   Highest education level: Not on file  Occupational History   Not on file  Tobacco Use   Smoking status: Every Day    Packs/day: 0.30    Types: Cigarettes   Smokeless tobacco: Never   Tobacco comments:    2-3 cigs / day  Vaping Use   Vaping Use: Never used  Substance and Sexual Activity   Alcohol use: Yes    Alcohol/week: 0.0 standard drinks of alcohol    Comment: weekend-04/25/2018   Drug use: Yes    Types: Marijuana, "Crack" cocaine    Comment: crack-last week 04/21/2018   Sexual activity: Not on file  Other Topics Concern   Not on file  Social History Narrative   Not on file   Social Determinants of Health   Financial Resource Strain: Not on file  Food  Insecurity: Not on file  Transportation Needs: Not on file  Physical Activity: Not on file  Stress: Not on file  Social Connections: Not on file  Intimate Partner Violence: Not on file    Review of Systems: ROS negative except for what is noted on the assessment and plan.  Objective:   Vitals:   05/01/22 0902  BP: 129/60  Pulse: 76  Resp: (!) 28  Temp: 97.7 F (36.5 C)  TempSrc: Oral  SpO2: 100%  Weight: 164 lb 11.2 oz (74.7 kg)  Height: '5\' 1"'$  (1.549 m)    Physical Exam: Constitutional: well-appearing *** sitting in ***, in no acute distress HENT: normocephalic atraumatic, mucous membranes moist Eyes: conjunctiva non-erythematous Neck: supple Cardiovascular: regular rate and rhythm, no m/r/g Pulmonary/Chest: normal work of breathing on room air, lungs clear to auscultation bilaterally Abdominal: soft, non-tender, non-distended MSK: normal bulk and tone Neurological: alert & oriented x 3, 5/5 strength in bilateral upper and lower extremities, normal gait Skin: warm and dry Psych: ***     Assessment & Plan:  No problem-specific Assessment & Plan notes found for this encounter.    Patient discussed with Dr. Brent General Nevelyn Mellott, D.O. [facility]  PGY-[id number] Pager: [phone]  Phone: [phone] Date 05/01/2022  Time  10:17 AM

## 2022-05-02 NOTE — Assessment & Plan Note (Signed)
Assessment: Patient endorses drinking 5-10 beers or hard liquor drinks on Saturday and Sunday each week.  Talked about wanting to decrease the amount that she is drinking.  I asked if she would be interested in using medication to help decrease cravings and she is not interested in adding medication at this time. Plan: Continue to counsel alcohol cessation.  I wanted to repeat LFTs since last were completed in July 2021.  I broached topic 3 separate times during encounter and patient politely declined labs each time.  He states that she is very afraid of needles and does not feel up to having labs drawn.  I talked with her about concern for liver scarring and need to monitor how her liver is doing.  She endorses understanding but does not wish to have labs done today. -Need for follow-up labs.

## 2022-05-02 NOTE — Progress Notes (Signed)
Internal Medicine Clinic Attending  Case discussed with Dr. Masters  At the time of the visit.  We reviewed the resident's history and exam and pertinent patient test results.  I agree with the assessment, diagnosis, and plan of care documented in the resident's note.  

## 2022-05-02 NOTE — Assessment & Plan Note (Addendum)
Assessment: Hemoglobin A1c increased from 5.9-6.2.  She remains at goal of less than 7.  Been taking metformin differently than was written.  I updated metformin dosing to 500 mg daily. Plan: Repeat hemoglobin A1c to February 2024 Continue metformin 500 mg daily referral to podiatry referral to ophthalmology, previously referred to ophthalmology but has not followed up.  I talked with her about reason to complete yearly screening in the setting of her diabetes and she was agreeable with another referral.

## 2022-05-04 LAB — FECAL OCCULT BLOOD, IMMUNOCHEMICAL: Fecal Occult Bld: NEGATIVE

## 2022-05-17 ENCOUNTER — Ambulatory Visit: Payer: Medicaid Other | Admitting: Podiatry

## 2022-06-07 ENCOUNTER — Ambulatory Visit (INDEPENDENT_AMBULATORY_CARE_PROVIDER_SITE_OTHER): Payer: Medicaid Other | Admitting: Podiatry

## 2022-06-07 DIAGNOSIS — B351 Tinea unguium: Secondary | ICD-10-CM

## 2022-06-07 MED ORDER — TERBINAFINE HCL 250 MG PO TABS: 250.0000 mg | ORAL_TABLET | Freq: Every day | ORAL | 2 refills | Status: AC

## 2022-06-07 NOTE — Progress Notes (Signed)
  Subjective:  Patient ID: [name], female    DOB: [birth year],  MRN: [id number]  Chief Complaint  Patient presents with   Nail Problem    toenail fungus to right hallux    61 y.o. female presents with complaint of fungus to right hallux nail. She states the nail has been growing like this for extended period of time. Denies pain on the nail. Has not tried any treatment yet. Denies liver issue or anticoagulent use.   Past Medical History:  Diagnosis Date   Cocaine abuse ([place])    ETOH abuse    Hypothyroidism    Unspecified mood (affective) disorder (HCC)     No Known Allergies  ROS: Negative except as per HPI above  Objective:  General: AAO x3, NAD  Dermatological: R hallux nail extremely thickened, dark discoloration of nail with dystrophic growth. No pain with palpation. Remaining nails mostly unaffected and normal in appaerance.   Vascular:  Dorsalis Pedis artery and Posterior Tibial artery pedal pulses are 2/4 bilateral.  Capillary fill time < 3 sec to all digits.   Neruologic: Grossly intact via light touch bilateral. Protective threshold intact to all sites bilateral.   Musculoskeletal: No gross boney pedal deformities bilateral. No pain, crepitus, or limitation noted with foot and ankle range of motion bilateral. Muscular strength 5/5 in all groups tested bilateral.  Gait: Unassisted, Nonantalgic.   No images are attached to the encounter.  Assessment:   1. Onychomycosis      Plan:  Patient was evaluated and treated and all questions answered.  #Onychomycosis, primarily R hallux nail -Educated on etiology of nail fungus. -Nail sample taken for microbiology and histology. -Baseline liver function studies ordered. Will d/c terbinafine if elevated during therapy. -eRx for oral terbinafine 250 mg daily x 90 days, #30 w 2 refill. Educated on risks and benefits of the medication.   Return in about 3 months (around 09/06/2022) for follow up R hallux fungus.           Everitt Amber, DPM [facility]

## 2022-06-19 ENCOUNTER — Telehealth: Payer: Self-pay | Admitting: *Deleted

## 2022-06-19 NOTE — Telephone Encounter (Signed)
[name] is calling to make sure that the physician is aware that the results submitted are critical.

## 2022-06-20 NOTE — Telephone Encounter (Signed)
Click on scan order.

## 2022-06-20 NOTE — Telephone Encounter (Signed)
In epic under labs ,ready to view

## 2022-09-06 ENCOUNTER — Ambulatory Visit (INDEPENDENT_AMBULATORY_CARE_PROVIDER_SITE_OTHER): Payer: Medicaid Other | Admitting: Podiatry

## 2022-09-06 DIAGNOSIS — Z91199 Patient's noncompliance with other medical treatment and regimen due to unspecified reason: Secondary | ICD-10-CM

## 2022-09-06 NOTE — Progress Notes (Signed)
Pt was a no show for apt, no charge 

## 2022-10-26 ENCOUNTER — Other Ambulatory Visit: Payer: Self-pay | Admitting: Internal Medicine

## 2022-10-26 DIAGNOSIS — E118 Type 2 diabetes mellitus with unspecified complications: Secondary | ICD-10-CM

## 2022-10-29 NOTE — Telephone Encounter (Signed)
Called and lvm for patient to give [place] a call to schedule a fu visit in order for her to continue to get her medication refilled.

## 2022-11-28 ENCOUNTER — Ambulatory Visit (INDEPENDENT_AMBULATORY_CARE_PROVIDER_SITE_OTHER): Payer: Medicaid Other | Admitting: Student

## 2022-11-28 ENCOUNTER — Other Ambulatory Visit: Payer: Self-pay

## 2022-11-28 ENCOUNTER — Encounter: Payer: Self-pay | Admitting: Student

## 2022-11-28 VITALS — BP 125/49 | HR 62 | Temp 98.1°F | Resp 28 | Ht 61.0 in | Wt 168.9 lb

## 2022-11-28 DIAGNOSIS — F17210 Nicotine dependence, cigarettes, uncomplicated: Secondary | ICD-10-CM | POA: Diagnosis not present

## 2022-11-28 DIAGNOSIS — F1020 Alcohol dependence, uncomplicated: Secondary | ICD-10-CM | POA: Diagnosis not present

## 2022-11-28 DIAGNOSIS — Z1231 Encounter for screening mammogram for malignant neoplasm of breast: Secondary | ICD-10-CM

## 2022-11-28 DIAGNOSIS — B351 Tinea unguium: Secondary | ICD-10-CM | POA: Diagnosis not present

## 2022-11-28 DIAGNOSIS — F17200 Nicotine dependence, unspecified, uncomplicated: Secondary | ICD-10-CM

## 2022-11-28 DIAGNOSIS — E118 Type 2 diabetes mellitus with unspecified complications: Secondary | ICD-10-CM | POA: Diagnosis not present

## 2022-11-28 DIAGNOSIS — E782 Mixed hyperlipidemia: Secondary | ICD-10-CM

## 2022-11-28 DIAGNOSIS — C519 Malignant neoplasm of vulva, unspecified: Secondary | ICD-10-CM

## 2022-11-28 DIAGNOSIS — E890 Postprocedural hypothyroidism: Secondary | ICD-10-CM | POA: Diagnosis not present

## 2022-11-28 DIAGNOSIS — Z862 Personal history of diseases of the blood and blood-forming organs and certain disorders involving the immune mechanism: Secondary | ICD-10-CM | POA: Diagnosis not present

## 2022-11-28 DIAGNOSIS — K760 Fatty (change of) liver, not elsewhere classified: Secondary | ICD-10-CM

## 2022-11-28 DIAGNOSIS — F1010 Alcohol abuse, uncomplicated: Secondary | ICD-10-CM

## 2022-11-28 DIAGNOSIS — Z1159 Encounter for screening for other viral diseases: Secondary | ICD-10-CM

## 2022-11-28 DIAGNOSIS — Z0000 Encounter for general adult medical examination without abnormal findings: Secondary | ICD-10-CM

## 2022-11-28 DIAGNOSIS — F332 Major depressive disorder, recurrent severe without psychotic features: Secondary | ICD-10-CM | POA: Diagnosis not present

## 2022-11-28 DIAGNOSIS — K769 Liver disease, unspecified: Secondary | ICD-10-CM

## 2022-11-28 DIAGNOSIS — E785 Hyperlipidemia, unspecified: Secondary | ICD-10-CM

## 2022-11-28 LAB — GLUCOSE, CAPILLARY: Glucose-Capillary: 92 mg/dL (ref 70–99)

## 2022-11-28 LAB — PROTIME-INR
INR: 1 (ref 0.8–1.2)
Prothrombin Time: 13.2 seconds (ref 11.4–15.2)

## 2022-11-28 LAB — POCT GLYCOSYLATED HEMOGLOBIN (HGB A1C): Hemoglobin A1C: 6 % — AB (ref 4.0–5.6)

## 2022-11-28 NOTE — Assessment & Plan Note (Addendum)
Lipid Panel     Component Value Date/Time   CHOL 194 01/25/2020 1200   TRIG 209 (H) 01/25/2020 1200   HDL 52 01/25/2020 1200   CHOLHDL 3.7 01/25/2020 1200   CHOLHDL 2.9 09/12/2011 0904   VLDL 13 09/12/2011 0904   LDLCALC 106 (H) 01/25/2020 1200   LABVLDL 36 01/25/2020 1200   Patient currently not taking crestor. Will check lipid profile this clinic visit and if appropriate will resume her crestor.   Addendum: ASCVD risk score is 10.8% Will send refill for crestor.

## 2022-11-28 NOTE — Assessment & Plan Note (Signed)
Patient will need pap smear at next clinic visit.

## 2022-11-28 NOTE — Assessment & Plan Note (Addendum)
A1c is 6. Patient is currently on metformin 1g BID.  Will continue this.  Check vitamin b12 as patient is on metformin, check GFR and Umicroalb/cr ratio.

## 2022-11-28 NOTE — Assessment & Plan Note (Addendum)
Patient with mild transaminitis on labs and noted to have hepatic steatosis on RUQ [place]. Discussed with patient the importance of abstaining from alcohol given she has evidence of injury to her liver.  She has no signs or symptoms of cirrhosis or liver failure. No scleral icterus, ascites, spider agnioma. Denies hematemesis.   Plan: Check CMP, PT/INR, CBC for platelet count and anemia

## 2022-11-28 NOTE — Assessment & Plan Note (Signed)
Patient continues to drink 5-10 beers on weekend days. She is currently not interested in medications to help curb her alcohol use or with decreasing her drinking. We did discuss the impact her alcohol use is having on her liver.  -Will check Mg, phos, vitb12, cmp -Continue to counsel patient on cessation at future visits.

## 2022-11-28 NOTE — Assessment & Plan Note (Signed)
Follows with [facility] for this. Currently on abilify.

## 2022-11-28 NOTE — Assessment & Plan Note (Addendum)
Not interested in colonoscopy currently. Willing to get stool based CRC screening can order this at next clinic visit.   Ordered mammogram.   Screen for hep C.

## 2022-11-28 NOTE — Progress Notes (Signed)
   CC: F/u of chronic medical problems.   HPI:  Ms.Bilan L Albor is a 62 y.o. F with PMH per below who presents for follow up of her chronic medical problems. Please see problem based charting under encounters tab for further details.    Past Medical History:  Diagnosis Date   Cocaine abuse (HCC)    ETOH abuse    Hypothyroidism    Unspecified mood (affective) disorder (HCC)    Review of Systems:  Please see problem based charting under encounters tab for further details.    Physical Exam:  Vitals:   11/28/22 0926  BP: (!) 125/49  Pulse: 62  Resp: (!) 28  Temp: 98.1 F (36.7 C)  TempSrc: Oral  SpO2: 99%  Weight: 168 lb 14.4 oz (76.6 kg)  Height: 5\' 1"  (1.549 m)   Constitutional: Well-developed, well-nourished, and in no distress.  HENT:  Head: Normocephalic and atraumatic.  Eyes: EOM are normal.  Neck: Normal range of motion.  Cardiovascular: Normal rate, regular rhythm. No gallop and no friction rub.  No murmur heard. No lower extremity edema. Palpable DP. Pulmonary: Non labored breathing on room air, no wheezing or rales  Abdominal: Soft. Normal bowel sounds. Non distended and non tender Musculoskeletal: Normal range of motion.        General: No tenderness or edema.  Neurological: Alert and oriented to person, place, and time. Non focal  Skin: Skin is warm and dry.     Media Information    Media Information      Assessment & Plan:   See Encounters Tab for problem based charting.  Patient discussed with Dr. Heide Spark

## 2022-11-28 NOTE — Assessment & Plan Note (Signed)
Last CBC 2021 with no anemia. Will follow up cbc.

## 2022-11-28 NOTE — Assessment & Plan Note (Signed)
Patient continues to smoke and is currently not interested in quitting. She states she has cut back on her smoking and smokes 2-3 cigarettes daily. Gave patient hotline for assistance with tobacco use and will address this at subsequent appointments.

## 2022-11-28 NOTE — Patient Instructions (Addendum)
1-800-QUIT-NOW 5518785578), to get nicotine   Please call [facility] to schedule an appointment for your right toe fungus.  [phone]  Please continue to decrease your alcohol intake as it is damaging your liver.    Please schedule an appointment in 1 month for pap smear.

## 2022-11-28 NOTE — Assessment & Plan Note (Addendum)
No symptoms of hypo/hyper thyroidism currently on of thyroid hormone.  -Will check TSH  Addendum: TSH within normal levels will continue levothyroxine.

## 2022-11-29 LAB — MICROALBUMIN / CREATININE URINE RATIO
Creatinine, Urine: 263.6 mg/dL
Microalb/Creat Ratio: 6 mg/g creat (ref 0–29)
Microalbumin, Urine: 16.7 ug/mL

## 2022-11-29 LAB — MAGNESIUM: Magnesium: 2.2 mg/dL (ref 1.6–2.3)

## 2022-11-29 LAB — LIPID PANEL
HDL: 46 mg/dL (ref >39)
LDL Chol Calc (NIH): 117 mg/dL — ABNORMAL HIGH (ref 0–99)

## 2022-11-29 LAB — CBC: RBC: 4.73 x10E6/uL (ref 3.77–5.28)

## 2022-11-29 LAB — HEPATITIS C ANTIBODY: Hep C Virus Ab: NONREACTIVE

## 2022-11-30 LAB — CMP14 + ANION GAP
ALT: 39 IU/L — ABNORMAL HIGH (ref 0–32)
AST: 28 IU/L (ref 0–40)
Albumin/Globulin Ratio: 1.8 (ref 1.2–2.2)
Albumin: 4.2 g/dL (ref 3.9–4.9)
Alkaline Phosphatase: 78 IU/L (ref 44–121)
Anion Gap: 14 mmol/L (ref 10.0–18.0)
BUN/Creatinine Ratio: 19 (ref 12–28)
BUN: 14 mg/dL (ref 8–27)
Bilirubin Total: 0.4 mg/dL (ref 0.0–1.2)
CO2: 23 mmol/L (ref 20–29)
Calcium: 9.3 mg/dL (ref 8.7–10.3)
Chloride: 108 mmol/L — ABNORMAL HIGH (ref 96–106)
Creatinine, Ser: 0.74 mg/dL (ref 0.57–1.00)
Globulin, Total: 2.3 g/dL (ref 1.5–4.5)
Glucose: 88 mg/dL (ref 70–99)
Potassium: 4.7 mmol/L (ref 3.5–5.2)
Sodium: 145 mmol/L — ABNORMAL HIGH (ref 134–144)
Total Protein: 6.5 g/dL (ref 6.0–8.5)
eGFR: 92 mL/min/{1.73_m2} (ref >59)

## 2022-11-30 LAB — PHOSPHORUS: Phosphorus: 2.8 mg/dL — ABNORMAL LOW (ref 3.0–4.3)

## 2022-11-30 LAB — TSH: TSH: 3.48 u[IU]/mL (ref 0.450–4.500)

## 2022-11-30 LAB — CBC
Hematocrit: 39.9 % (ref 34.0–46.6)
Hemoglobin: 13.1 g/dL (ref 11.1–15.9)
MCH: 27.7 pg (ref 26.6–33.0)
MCHC: 32.8 g/dL (ref 31.5–35.7)
MCV: 84 fL (ref 79–97)
Platelets: 336 10*3/uL (ref 150–450)
RDW: 12.8 % (ref 11.7–15.4)
WBC: 7.6 10*3/uL (ref 3.4–10.8)

## 2022-11-30 LAB — LIPID PANEL
Chol/HDL Ratio: 4.1 ratio (ref 0.0–4.4)
Cholesterol, Total: 190 mg/dL (ref 100–199)
Triglycerides: 151 mg/dL — ABNORMAL HIGH (ref 0–149)
VLDL Cholesterol Cal: 27 mg/dL (ref 5–40)

## 2022-11-30 LAB — VITAMIN B12: Vitamin B-12: 531 pg/mL (ref 232–1245)

## 2022-11-30 MED ORDER — ROSUVASTATIN CALCIUM 10 MG PO TABS
10.0000 mg | ORAL_TABLET | Freq: Every day | ORAL | 3 refills | Status: DC
Start: 2022-11-30 — End: 2023-02-11

## 2022-11-30 NOTE — Addendum Note (Signed)
Addended by: Michelle Piper on: 11/30/2022 11:42 AM   Modules accepted: Orders

## 2022-12-03 NOTE — Progress Notes (Signed)
Internal Medicine Clinic Attending  Case discussed with Dr. Carter  At the time of the visit.  We reviewed the resident's history and exam and pertinent patient test results.  I agree with the assessment, diagnosis, and plan of care documented in the resident's note.  

## 2022-12-05 ENCOUNTER — Encounter: Payer: Self-pay | Admitting: *Deleted

## 2023-01-07 ENCOUNTER — Other Ambulatory Visit: Payer: Self-pay | Admitting: Internal Medicine

## 2023-01-07 DIAGNOSIS — E118 Type 2 diabetes mellitus with unspecified complications: Secondary | ICD-10-CM

## 2023-01-11 ENCOUNTER — Other Ambulatory Visit: Payer: Self-pay

## 2023-01-11 DIAGNOSIS — E890 Postprocedural hypothyroidism: Secondary | ICD-10-CM

## 2023-01-11 DIAGNOSIS — E118 Type 2 diabetes mellitus with unspecified complications: Secondary | ICD-10-CM

## 2023-01-11 MED ORDER — LEVOTHYROXINE SODIUM 88 MCG PO TABS
88.0000 ug | ORAL_TABLET | Freq: Every day | ORAL | 3 refills | Status: DC
Start: 2023-01-11 — End: 2023-02-11

## 2023-01-11 MED ORDER — METFORMIN HCL ER 500 MG PO TB24
1000.0000 mg | ORAL_TABLET | Freq: Two times a day (BID) | ORAL | 0 refills | Status: DC
Start: 2023-01-11 — End: 2023-03-13

## 2023-02-11 ENCOUNTER — Ambulatory Visit: Payer: MEDICAID | Admitting: Student

## 2023-02-11 ENCOUNTER — Other Ambulatory Visit: Payer: Self-pay

## 2023-02-11 ENCOUNTER — Encounter: Payer: Self-pay | Admitting: Student

## 2023-02-11 VITALS — BP 139/65 | HR 72 | Temp 97.8°F | Ht 61.0 in | Wt 168.7 lb

## 2023-02-11 DIAGNOSIS — F17200 Nicotine dependence, unspecified, uncomplicated: Secondary | ICD-10-CM

## 2023-02-11 DIAGNOSIS — E785 Hyperlipidemia, unspecified: Secondary | ICD-10-CM | POA: Diagnosis not present

## 2023-02-11 DIAGNOSIS — E890 Postprocedural hypothyroidism: Secondary | ICD-10-CM | POA: Diagnosis not present

## 2023-02-11 DIAGNOSIS — E118 Type 2 diabetes mellitus with unspecified complications: Secondary | ICD-10-CM

## 2023-02-11 DIAGNOSIS — Z7984 Long term (current) use of oral hypoglycemic drugs: Secondary | ICD-10-CM

## 2023-02-11 DIAGNOSIS — Z0000 Encounter for general adult medical examination without abnormal findings: Secondary | ICD-10-CM

## 2023-02-11 DIAGNOSIS — F17210 Nicotine dependence, cigarettes, uncomplicated: Secondary | ICD-10-CM | POA: Diagnosis not present

## 2023-02-11 DIAGNOSIS — E782 Mixed hyperlipidemia: Secondary | ICD-10-CM

## 2023-02-11 MED ORDER — ROSUVASTATIN CALCIUM 20 MG PO TABS: 20.0000 mg | ORAL_TABLET | Freq: Every day | ORAL | 11 refills | Status: DC

## 2023-02-11 MED ORDER — LEVOTHYROXINE SODIUM 88 MCG PO TABS
88.0000 ug | ORAL_TABLET | Freq: Every day | ORAL | 3 refills | Status: DC
Start: 2023-02-11 — End: 2024-03-18

## 2023-02-11 NOTE — Assessment & Plan Note (Signed)
Patient presents with a history of hypothyroidism controlled on 88 mcg of levothyroxine.  Prior TSH was 3.480 in May 2024.  He report compliance with this medication however did lose a bottle of the levothyroxine and needs a refill. Plan -Will levothyroxine 88 mcg, no repeat TSH today

## 2023-02-11 NOTE — Patient Instructions (Signed)
Please return in 4-6 weeks for: -Hbg A1c for the diabetes -Lipid panel: to check for the cholesterol level, we increased with rosuvastatin to 20 mg -PAP Smear   You should receive a call to schedule the CT to screen for lung cancer

## 2023-02-11 NOTE — Assessment & Plan Note (Signed)
Patient has extensive healthcare maintenance tasks to complete.  She declines the shingles, Tdap, COVID, influenza vaccine.  She denied any type of colon cancer screening.  A mammogram was previously ordered and she needs to schedule the mammogram.  She is also due for a Pap smear but would like to make an appointment for this in the next 4 to 6 weeks.  She is also eligible for a CT scan for lung cancer screening, she is not underwent a CT scan for the lung cancer screening and has a pack-year 46 years, current smoker. PLAN: -Patient was informed of the risk and benefits of screening for lung cancer  -Will return in 4 to 6 weeks for Pap smear -I recommended colon cancer screening and the vaccines listed above, patient is hesitant

## 2023-02-11 NOTE — Assessment & Plan Note (Signed)
Patient presents with history of type 2 diabetes currently controlled with 2000 mg of metformin.  Prior A1c end of May was 6.0.  Her microalbumin/creatinine urine is up to date.  Her ophthalmology eye exam scheduled for September.  And her foot exam is up-to-date.  Reports polyuria today but this is chronic Plan -Patient would like to repeat her A1c in 4 to 6 weeks when we repeat the lipid profile -Patient was reminded the importance of obtaining an ophthalmology eye exam

## 2023-02-11 NOTE — Assessment & Plan Note (Signed)
Patient has a history of hyperlipidemia currently on Crestor 10 mg.  Prior lipid profile in May 2024 has an LDL of 117 which is not at goal for a diabetic.  She reports compliance with the Crestor. Plan -Increase Crestor to 20 mg a day -Get lipid profile in 4 to 6 weeks

## 2023-02-11 NOTE — Progress Notes (Signed)
Established Patient Office Visit  Subjective   Patient ID: [name], female    DOB: 04/08/1961  Age: 62 y.o. MRN: [id number]  Chief Complaint  Patient presents with   Diabetes   Medication Refill    Thyroid, per pt she lost her meds.    [name] is a 62 year old female with a past medical history listed below who presents today to meet her new PCP.  Please see problem based assessment and plan for additional details.     Past Medical History:  Diagnosis Date   Cocaine abuse (HCC)    ETOH abuse    Hypothyroidism    Unspecified mood (affective) disorder (HCC)        Objective:     BP 139/65 (BP Location: Right Arm, Patient Position: Sitting, Cuff Size: Normal)   Pulse 72   Temp 97.8 F (36.6 C) (Oral)   Ht 5\' 1"  (1.549 m)   Wt 168 lb 11.2 oz (76.5 kg)   SpO2 99%   BMI 31.88 kg/m  BP Readings from Last 3 Encounters:  02/11/23 139/65  11/28/22 (!) 125/49  05/01/22 129/60      Physical Exam Eyes:     General: No scleral icterus. Cardiovascular:     Rate and Rhythm: Normal rate and regular rhythm.     Heart sounds: Normal heart sounds.  Pulmonary:     Effort: Pulmonary effort is normal.     Breath sounds: Normal breath sounds.  Musculoskeletal:     Right lower leg: No edema.     Left lower leg: No edema.  Skin:    General: Skin is warm and dry.  Neurological:     Mental Status: She is alert and oriented to person, place, and time.  Psychiatric:        Mood and Affect: Mood normal.        Behavior: Behavior is cooperative.      No results found for any visits on 02/11/23.  Last CBC Lab Results  Component Value Date   WBC 7.6 11/28/2022   HGB 13.1 11/28/2022   HCT 39.9 11/28/2022   MCV 84 11/28/2022   MCH 27.7 11/28/2022   RDW 12.8 11/28/2022   PLT 336 11/28/2022   Last metabolic panel Lab Results  Component Value Date   GLUCOSE 88 11/28/2022   NA 145 (H) 11/28/2022   K 4.7 11/28/2022   CL 108 (H) 11/28/2022   CO2 23  11/28/2022   BUN 14 11/28/2022   CREATININE 0.74 11/28/2022   EGFR 92 11/28/2022   CALCIUM 9.3 11/28/2022   PHOS 2.8 (L) 11/28/2022   PROT 6.5 11/28/2022   ALBUMIN 4.2 11/28/2022   LABGLOB 2.3 11/28/2022   AGRATIO 1.8 11/28/2022   BILITOT 0.4 11/28/2022   ALKPHOS 78 11/28/2022   AST 28 11/28/2022   ALT 39 (H) 11/28/2022   ANIONGAP 13 10/04/2018   Last lipids Lab Results  Component Value Date   CHOL 190 11/28/2022   HDL 46 11/28/2022   LDLCALC 117 (H) 11/28/2022   TRIG 151 (H) 11/28/2022   CHOLHDL 4.1 11/28/2022   Last hemoglobin A1c Lab Results  Component Value Date   HGBA1C 6.0 (A) 11/28/2022      The 10-year ASCVD risk score (Arnett DK, et al., 2019) is: 30.7%    Assessment & Plan:   Problem List Items Addressed This Visit       Endocrine   Postablative hypothyroidism    Patient presents with  a history of hypothyroidism controlled on 88 mcg of levothyroxine.  Prior TSH was 3.480 in May 2024.  He report compliance with this medication however did lose a bottle of the levothyroxine and needs a refill. Plan -Will levothyroxine 88 mcg, no repeat TSH today       Relevant Medications   levothyroxine (EUTHYROX) 88 MCG tablet   Controlled type 2 diabetes mellitus with complication, without long-term current use of insulin (HCC)    Patient presents with history of type 2 diabetes currently controlled with 2000 mg of metformin.  Prior A1c end of May was 6.0.  Her microalbumin/creatinine urine is up to date.  Her ophthalmology eye exam scheduled for September.  And her foot exam is up-to-date.  Reports polyuria today but this is chronic Plan -Patient would like to repeat her A1c in 4 to 6 weeks when we repeat the lipid profile -Patient was reminded the importance of obtaining an ophthalmology eye exam      Relevant Medications   rosuvastatin (CRESTOR) 20 MG tablet     Other   TOBACCO ABUSE - Primary (Chronic)   Relevant Orders   CT CHEST LUNG CA SCREEN LOW DOSE  W/O CM   Health care maintenance    Patient has extensive healthcare maintenance tasks to complete.  She declines the shingles, Tdap, COVID, influenza vaccine.  She denied any type of colon cancer screening.  A mammogram was previously ordered and she needs to schedule the mammogram.  She is also due for a Pap smear but would like to make an appointment for this in the next 4 to 6 weeks.  She is also eligible for a CT scan for lung cancer screening, she is not underwent a CT scan for the lung cancer screening and has a pack-year 46 years, current smoker. PLAN: -Patient was informed of the risk and benefits of screening for lung cancer  -Will return in 4 to 6 weeks for Pap smear -I recommended colon cancer screening and the vaccines listed above, patient is hesitant      Hyperlipidemia    Patient has a history of hyperlipidemia currently on Crestor 10 mg.  Prior lipid profile in May 2024 has an LDL of 117 which is not at goal for a diabetic.  She reports compliance with the Crestor. Plan -Increase Crestor to 20 mg a day -Get lipid profile in 4 to 6 weeks      Relevant Medications   rosuvastatin (CRESTOR) 20 MG tablet       Return in about 4 weeks (around 03/11/2023) for Papsmeak, A1c, lipid profile .    Faith Rogue, DO

## 2023-02-14 NOTE — Progress Notes (Signed)
 Internal Medicine Clinic Attending  I was physically present during the key portions of the resident provided service and participated in the medical decision making of patient's management care. I reviewed pertinent patient test results.  The assessment, diagnosis, and plan were formulated together and I agree with the documentation in the resident's note.  Mercie Eon, MD

## 2023-03-12 ENCOUNTER — Other Ambulatory Visit: Payer: Self-pay | Admitting: Student

## 2023-03-12 DIAGNOSIS — E118 Type 2 diabetes mellitus with unspecified complications: Secondary | ICD-10-CM

## 2023-04-10 ENCOUNTER — Emergency Department (HOSPITAL_COMMUNITY): Payer: MEDICAID

## 2023-04-10 ENCOUNTER — Emergency Department (HOSPITAL_COMMUNITY)
Admission: EM | Admit: 2023-04-10 | Discharge: 2023-04-10 | Disposition: A | Discharge status: Home / Self Care | Payer: MEDICAID | Attending: Emergency Medicine | Admitting: Emergency Medicine

## 2023-04-10 ENCOUNTER — Encounter (HOSPITAL_COMMUNITY): Payer: Self-pay

## 2023-04-10 DIAGNOSIS — Y93K1 Activity, walking an animal: Secondary | ICD-10-CM | POA: Insufficient documentation

## 2023-04-10 DIAGNOSIS — Z7984 Long term (current) use of oral hypoglycemic drugs: Secondary | ICD-10-CM | POA: Diagnosis not present

## 2023-04-10 DIAGNOSIS — F17200 Nicotine dependence, unspecified, uncomplicated: Secondary | ICD-10-CM | POA: Insufficient documentation

## 2023-04-10 DIAGNOSIS — W548XXA Other contact with dog, initial encounter: Secondary | ICD-10-CM | POA: Insufficient documentation

## 2023-04-10 DIAGNOSIS — E119 Type 2 diabetes mellitus without complications: Secondary | ICD-10-CM | POA: Diagnosis not present

## 2023-04-10 DIAGNOSIS — S68624A Partial traumatic transphalangeal amputation of right ring finger, initial encounter: Secondary | ICD-10-CM | POA: Diagnosis not present

## 2023-04-10 DIAGNOSIS — Z23 Encounter for immunization: Secondary | ICD-10-CM | POA: Insufficient documentation

## 2023-04-10 DIAGNOSIS — S6991XA Unspecified injury of right wrist, hand and finger(s), initial encounter: Secondary | ICD-10-CM | POA: Diagnosis present

## 2023-04-10 HISTORY — DX: Depression, unspecified: F32.A

## 2023-04-10 HISTORY — DX: Type 2 diabetes mellitus without complications: E11.9

## 2023-04-10 MED ORDER — CEFAZOLIN SODIUM-DEXTROSE 2-4 GM/100ML-% IV SOLN: 2.0000 g | Freq: Once | INTRAVENOUS | Status: DC

## 2023-04-10 MED ORDER — CEPHALEXIN 500 MG PO CAPS
500.0000 mg | ORAL_CAPSULE | Freq: Two times a day (BID) | ORAL | 0 refills | Status: AC
Start: 2023-04-10 — End: 2023-04-17

## 2023-04-10 MED ORDER — BACITRACIN ZINC 500 UNIT/GM EX OINT
TOPICAL_OINTMENT | Freq: Two times a day (BID) | CUTANEOUS | Status: DC
  Administered 2023-04-10: 1 via TOPICAL

## 2023-04-10 MED ORDER — MORPHINE SULFATE (PF) 4 MG/ML IV SOLN
4.0000 mg | INTRAVENOUS | Status: DC | PRN
  Administered 2023-04-10: 4 mg via INTRAVENOUS
  Filled 2023-04-10: qty 1

## 2023-04-10 MED ORDER — TETANUS-DIPHTH-ACELL PERTUSSIS 5-2.5-18.5 LF-MCG/0.5 IM SUSY
0.5000 mL | PREFILLED_SYRINGE | Freq: Once | INTRAMUSCULAR | Status: AC
  Administered 2023-04-10: 0.5 mL via INTRAMUSCULAR
  Filled 2023-04-10: qty 0.5

## 2023-04-10 MED ORDER — OXYCODONE-ACETAMINOPHEN 5-325 MG PO TABS
1.0000 | ORAL_TABLET | Freq: Four times a day (QID) | ORAL | 0 refills | Status: DC | PRN
Start: 2023-04-10 — End: 2024-03-12

## 2023-04-10 NOTE — ED Triage Notes (Signed)
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 Pt reports around 1400, she was walking her dog and part of finger got wrapped in the least when her dog tried to run. Tip of right fourth digit at 1st knuckle amputated by leash. Amputated finger brought in on ice at bedside. 2 gm IV ancef given by EMS en route. Pt refused pain meds from EMS. 20 ga L AC. Last meal this morning. Last tetanus unknown.  EMS vitals: BP 168/74 HR 86 O2 99% room air CBG 113

## 2023-04-10 NOTE — Discharge Instructions (Signed)
It was a pleasure taking care of you today.  You need to be seen at the hand surgeons office tomorrow, Dr. Frazier Butt. There phone number and address are listed on your discharge paperwork.  Keep your wound covered and do no get it wet until you are seen by them tomorrow

## 2023-04-10 NOTE — ED Provider Notes (Signed)
[place] EMERGENCY DEPARTMENT AT [facility] Provider Note   CSN: [id number] Arrival date & time: 04/10/23  1913    History  Chief Complaint  Patient presents with   Finger Injury    amputation    [name] is a 62 y.o. female here for right hand injury.  Amputation DIP distally after she was walking her dog and her fingers were wrapped around the leash.  Dog tried to 1 subsequently amputating part of her finger. She is RHD. Did not get bite by the dog. Did not fall to the ground. Ancef given by EMS PTA. No numbness, weakness. Injury occurred around 2-3 PM today, did not have a phone or transportation. Went to neighbors house but they were all at work. Had to wait for them to get home prior to calling EMS.  Last PO intake around 11 AM today Hx of DM, tobacco use, etoh use, marijuana(2-3 times a week)  HPI     Home Medications Prior to Admission medications   Medication Sig Start Date End Date Taking? Authorizing Provider  ARIPiprazole (ABILIFY) 5 MG tablet Take 5 mg by mouth daily. 03/16/23  Yes [provider]  cephALEXin (KEFLEX) 500 MG capsule Take 1 capsule (500 mg total) by mouth 2 (two) times daily for 7 days. 04/10/23 04/17/23 Yes Kennon Encinas A, PA-C  levothyroxine (EUTHYROX) 88 MCG tablet Take 1 tablet (88 mcg total) by mouth daily. 02/11/23  Yes Bender, Irving Burton, DO  metFORMIN (GLUCOPHAGE-XR) 500 MG 24 hr tablet TAKE 2 TABLETS BY MOUTH TWICE DAILY WITH MEALS 03/13/23  Yes Faith Rogue, DO  oxyCODONE-acetaminophen (PERCOCET/ROXICET) 5-325 MG tablet Take 1 tablet by mouth every 6 (six) hours as needed for severe pain. 04/10/23  Yes Letesha Klecker A, PA-C  rosuvastatin (CRESTOR) 20 MG tablet Take 1 tablet (20 mg total) by mouth daily. 02/11/23 02/11/24 Yes Faith Rogue, DO      Allergies    Patient has no known allergies.    Review of Systems   Review of Systems  Constitutional: Negative.   HENT: Negative.    Respiratory: Negative.     Cardiovascular: Negative.   Gastrointestinal: Negative.   Genitourinary: Negative.   Musculoskeletal:        Right 4th digit partial amputation  Skin:  Positive for wound.  All other systems reviewed and are negative.   Physical Exam Updated Vital Signs BP 132/68 (BP Location: Right Arm)   Pulse 65   Temp 98.4 F (36.9 C) (Oral)   Resp 18   SpO2 98%  Physical Exam Vitals and nursing note reviewed.  Constitutional:      General: She is not in acute distress.    Appearance: She is well-developed. She is not ill-appearing, toxic-appearing or diaphoretic.  HENT:     Head: Normocephalic and atraumatic.     Nose: Nose normal.     Mouth/Throat:     Mouth: Mucous membranes are moist.  Eyes:     Pupils: Pupils are equal, round, and reactive to light.  Cardiovascular:     Rate and Rhythm: Normal rate.     Pulses: Normal pulses.          Radial pulses are 2+ on the right side and 2+ on the left side.     Heart sounds: Normal heart sounds.  Pulmonary:     Effort: No respiratory distress.  Abdominal:     General: There is no distension.  Musculoskeletal:        General:  Normal range of motion.     Cervical back: Normal range of motion.     Comments: Partial amputation to right 4th digit DIP. No bleeding. Exposed bone. Full ROM. No bony tenderness to proximal hand at [facility], wrist, forearm  Skin:    General: Skin is warm and dry.     Capillary Refill: Capillary refill takes less than 2 seconds.     Comments: See picture in chart  Neurological:     General: No focal deficit present.     Mental Status: She is alert.     Comments: Full ROM Right hand, right 4th digit partial amputation  Psychiatric:        Mood and Affect: Mood normal.          ED Results / Procedures / Treatments   Labs (all labs ordered are listed, but only abnormal results are displayed) Labs Reviewed - No data to display  EKG None  Radiology No results found.  Procedures Procedures     Medications Ordered in ED Medications  morphine (PF) 4 MG/ML injection 4 mg (4 mg Intravenous Given 04/10/23 2021)  bacitracin ointment (1 Application Topical Given 04/10/23 2118)  Tdap (BOOSTRIX) injection 0.5 mL (0.5 mLs Intramuscular Given 04/10/23 1935)   ED Course/ Medical Decision Making/ A&P Clinical Course as of 04/10/23 2125  Wed Apr 10, 2023  2028 Dr. Frazier Butt with Hand, Rec bacitracin, zeroform, cover and FU in office tomorrow [BH]    Clinical Course User Index [BH] Zinia Innocent A, PA-C   62 year old here for evaluation of right hand injury.  Dog earlier today when leash wrapped around fourth digit on right hand.  When dog pulled subsequently developed amputation at right 4th DIP.  On arrival she is neurovascularly intact.  No bleeding. Arrives with digit on ice per EMS. Received Ancef with EMS. No bony tenderness to proximal hand, wrist, forearm.  Plan on imaging, consult with hand surgery.  Will update tetanus.  Imaging personally viewed and interpreted:  Xray Right 4th digit amputation just distal to DIP  CONSULT with Dr. Frazier Butt with hand who rec bacitracin, zeroform and FU in office tomorrow  Discussed results with patient.  Will have her follow-up in office tomorrow.  She was given the contact information for [facility].  Her wound was dressed per hand surgery recommendations.  Will have her follow-up outpatient, return for any worsening symptoms.  I did start her on Keflex outpatient as well as pain management.  The patient has been appropriately medically screened and/or stabilized in the ED. I have low suspicion for any other emergent medical condition which would require further screening, evaluation or treatment in the ED or require inpatient management.  Patient is hemodynamically stable and in no acute distress.  Patient able to ambulate in department prior to ED.  Evaluation does not show acute pathology that would require ongoing or additional emergent  interventions while in the emergency department or further inpatient treatment.  I have discussed the diagnosis with the patient and answered all questions.  Pain is been managed while in the emergency department and patient has no further complaints prior to discharge.  Patient is comfortable with plan discussed in room and is stable for discharge at this time.  I have discussed strict return precautions for returning to the emergency department.  Patient was encouraged to follow-up with PCP/specialist refer to at discharge.  Medical Decision Making Amount and/or Complexity of Data Reviewed Independent Historian: EMS External Data Reviewed: radiology and notes. Radiology: ordered and independent interpretation performed. Decision-making details documented in ED Course.  Risk OTC drugs. Prescription drug management. Parenteral controlled substances. Decision regarding hospitalization. Diagnosis or treatment significantly limited by social determinants of health.          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

## 2023-05-15 ENCOUNTER — Telehealth: Payer: Self-pay | Admitting: *Deleted

## 2023-05-15 NOTE — Telephone Encounter (Signed)
05-20-2023 @ 9 am arrive [place] / lvm for patient regarding this appointment / patient can call to cancel or reschedule if not a good time [phone]

## 2023-05-16 ENCOUNTER — Other Ambulatory Visit: Payer: Self-pay | Admitting: Internal Medicine

## 2023-05-16 DIAGNOSIS — E118 Type 2 diabetes mellitus with unspecified complications: Secondary | ICD-10-CM

## 2023-05-20 ENCOUNTER — Ambulatory Visit (HOSPITAL_COMMUNITY): Admission: RE | Admit: 2023-05-20 | Payer: MEDICAID | Source: Ambulatory Visit

## 2023-05-22 ENCOUNTER — Encounter: Payer: MEDICAID | Admitting: Student

## 2023-06-07 ENCOUNTER — Ambulatory Visit (INDEPENDENT_AMBULATORY_CARE_PROVIDER_SITE_OTHER): Payer: MEDICAID | Admitting: Student

## 2023-06-07 ENCOUNTER — Encounter: Payer: Self-pay | Admitting: Student

## 2023-06-07 VITALS — BP 128/78 | HR 68 | Temp 97.5°F | Ht 61.0 in | Wt 165.5 lb

## 2023-06-07 DIAGNOSIS — Z1239 Encounter for other screening for malignant neoplasm of breast: Secondary | ICD-10-CM

## 2023-06-07 DIAGNOSIS — F332 Major depressive disorder, recurrent severe without psychotic features: Secondary | ICD-10-CM

## 2023-06-07 DIAGNOSIS — E782 Mixed hyperlipidemia: Secondary | ICD-10-CM

## 2023-06-07 DIAGNOSIS — Z122 Encounter for screening for malignant neoplasm of respiratory organs: Secondary | ICD-10-CM

## 2023-06-07 DIAGNOSIS — E118 Type 2 diabetes mellitus with unspecified complications: Secondary | ICD-10-CM | POA: Diagnosis not present

## 2023-06-07 DIAGNOSIS — Z0000 Encounter for general adult medical examination without abnormal findings: Secondary | ICD-10-CM

## 2023-06-07 DIAGNOSIS — E785 Hyperlipidemia, unspecified: Secondary | ICD-10-CM

## 2023-06-07 LAB — POCT GLYCOSYLATED HEMOGLOBIN (HGB A1C): Hemoglobin A1C: 5.9 % — AB (ref 4.0–5.6)

## 2023-06-07 LAB — GLUCOSE, CAPILLARY: Glucose-Capillary: 83 mg/dL (ref 70–99)

## 2023-06-07 MED ORDER — ROSUVASTATIN CALCIUM 20 MG PO TABS
20.0000 mg | ORAL_TABLET | Freq: Every day | ORAL | 3 refills | Status: DC
Start: 2023-06-07 — End: 2024-03-12

## 2023-06-07 NOTE — Assessment & Plan Note (Signed)
PHQ-9 score of 3 today, some intermittent sadness but otherwise she reports feeling quite well.  She sees a psychiatrist every 3 months and is on Abilify. - Continue Abilify 5 daily

## 2023-06-07 NOTE — Assessment & Plan Note (Signed)
Recent evaluation revealed LDL of 117.  Her Crestor was increased from 10 to 20mg  daily this past summer with plans to repeat lipid panel.  Today she is refusing lipid panel or other blood draws.  She expects her cholesterol has improved and I do expect the same, but I would like to know for sure, which I mentioned to her.  I also shared the risks of vascular disease and why I would like to monitor, but she would like to wait at this time.  I believe she would be receptive to a blood draw at a later date. -Continue Crestor 20 daily

## 2023-06-07 NOTE — Assessment & Plan Note (Addendum)
Reported history of type 2 diabetes but I cannot see any A1c's that are above 6.5.  The highest I can see is 6.4 2021 but there could be records I do not have access to.  At any rate today her A1c is 5.9 and has been at a similar level for the last 2 years.  She does report taking metformin 2000 daily, but fill history suggests that her most recent dispense was in April of this year, 8 months ago.  I am not sure if she is taking metformin, but I do not think she needs to.  I will ask her to stop taking it and will cancel this prescription.  He is agreeable.  Can certainly return to this medicine in the future should she need it. -Stop metformin 2000 daily - Diabetic foot exam today unremarkable

## 2023-06-07 NOTE — Assessment & Plan Note (Addendum)
Order was placed for low-dose CT scan at last visit but I do not believe were set up.  I will reorder these today.  Will also order fecal occult test for colon cancer screening, she prefers this to colonoscopy.  Plans were also made to perform a Pap smear today, but she is refusing that today in the setting of the cold weather not wanting to get undressed.  I did offer to send her to a gynecologist if she would prefer that but she said that was not necessary. -Colon cancer screening (FOBT), CT scan per smoking, mammogram ordered today

## 2023-06-07 NOTE — Progress Notes (Signed)
CC: Diabetes and healthcare maintenance  HPI:  [name] is a 62 y.o. female living with a history stated below and presents today for Diabetes follow up. She has no acute complaint. Please see problem based assessment and plan for additional details.  Past Medical History:  Diagnosis Date   Cocaine abuse (HCC)    Depression    Diabetes mellitus without complication (HCC)    ETOH abuse    Hypothyroidism    Unspecified mood (affective) disorder (HCC)     Current Outpatient Medications on File Prior to Visit  Medication Sig Dispense Refill   ARIPiprazole (ABILIFY) 5 MG tablet Take 5 mg by mouth daily.     levothyroxine (EUTHYROX) 88 MCG tablet Take 1 tablet (88 mcg total) by mouth daily. 90 tablet 3   oxyCODONE-acetaminophen (PERCOCET/ROXICET) 5-325 MG tablet Take 1 tablet by mouth every 6 (six) hours as needed for severe pain. 10 tablet 0   No current facility-administered medications on file prior to visit.    History reviewed. No pertinent family history.  Social History   Socioeconomic History   Marital status: Single    Spouse name: Not on file   Number of children: Not on file   Years of education: Not on file   Highest education level: Not on file  Occupational History   Not on file  Tobacco Use   Smoking status: Every Day    Current packs/day: 0.30    Types: Cigarettes   Smokeless tobacco: Never   Tobacco comments:    2-3 cigs / day  Vaping Use   Vaping status: Never Used  Substance and Sexual Activity   Alcohol use: Yes    Alcohol/week: 4.0 standard drinks of alcohol    Types: 4 Cans of beer per week    Comment: every weekend   Drug use: Yes    Types: Marijuana   Sexual activity: Not on file  Other Topics Concern   Not on file  Social History Narrative   Not on file   Social Determinants of Health   Financial Resource Strain: Not on file  Food Insecurity: No Food Insecurity (05/02/2022)   Hunger Vital Sign    Worried About Running  Out of Food in the Last Year: Never true    Ran Out of Food in the Last Year: Never true  Transportation Needs: No Transportation Needs (05/01/2022)   PRAPARE - Administrator, Civil Service (Medical): No    Lack of Transportation (Non-Medical): No  Physical Activity: Not on file  Stress: Not on file  Social Connections: Unknown (11/28/2022)   Social Connection and Isolation Panel [NHANES]    Frequency of Communication with Friends and Family: Not on file    Frequency of Social Gatherings with Friends and Family: Patient unable to answer    Attends Religious Services: Never    Database administrator or Organizations: No    Attends Banker Meetings: Never    Marital Status: Living with partner  Recent Concern: Social Connections - Moderately Isolated (11/28/2022)   Social Connection and Isolation Panel [NHANES]    Frequency of Communication with Friends and Family: Not on file    Frequency of Social Gatherings with Friends and Family: Patient unable to answer    Attends Religious Services: Never    Database administrator or Organizations: No    Attends Banker Meetings: Never    Marital Status: Living with partner  Intimate Partner Violence:  Not At Risk (05/02/2022)   Humiliation, Afraid, Rape, and Kick questionnaire    Fear of Current or Ex-Partner: No    Emotionally Abused: No    Physically Abused: No    Sexually Abused: No    Review of Systems: ROS negative except for what is noted on the assessment and plan.  Vitals:   06/07/23 1036  BP: 128/78  Pulse: 68  Temp: (!) 97.5 F (36.4 C)  TempSrc: Oral  SpO2: 100%  Weight: 165 lb 8 oz (75.1 kg)  Height: 5\' 1"  (1.549 m)    Physical Exam: Constitutional: well-appearing female sitting in chair, in no acute distress HENT: normocephalic atraumatic, mucous membranes moist Eyes: conjunctiva non-erythematous Cardiovascular: regular rate and rhythm, no m/r/g Pulmonary/Chest: normal work of  breathing on room air, lungs clear to auscultation bilaterally MSK: normal bulk and tone Neurological: alert & oriented x 3, no focal deficit Skin: warm and dry Diabetic Feet: sensation, pulses intact without lesions. Psych: normal mood and behavior  Assessment & Plan:   Patient discussed with Dr. Antony Contras  Controlled type 2 diabetes mellitus with complication, without long-term current use of insulin (HCC) Reported history of type 2 diabetes but I cannot see any A1c's that are above 6.5.  The highest I can see is 6.4 2021 but there could be records I do not have access to.  At any rate today her A1c is 5.9 and has been at a similar level for the last 2 years.  She does report taking metformin 2000 daily, but fill history suggests that her most recent dispense was in April of this year, 8 months ago.  I am not sure if she is taking metformin, but I do not think she needs to.  I will ask her to stop taking it and will cancel this prescription.  He is agreeable.  Can certainly return to this medicine in the future should she need it. -Stop metformin 2000 daily - Diabetic foot exam today unremarkable  Hyperlipidemia Recent evaluation revealed LDL of 117.  Her Crestor was increased from 10 to 20mg  daily this past summer with plans to repeat lipid panel.  Today she is refusing lipid panel or other blood draws.  She expects her cholesterol has improved and I do expect the same, but I would like to know for sure, which I mentioned to her.  I also shared the risks of vascular disease and why I would like to monitor, but she would like to wait at this time.  I believe she would be receptive to a blood draw at a later date. -Continue Crestor 20 daily  Health care maintenance Order was placed for low-dose CT scan at last visit but I do not believe were set up.  I will reorder these today.  Will also order fecal occult test for colon cancer screening, she prefers this to colonoscopy.  Plans were also made  to perform a Pap smear today, but she is refusing that today in the setting of the cold weather not wanting to get undressed.  I did offer to send her to a gynecologist if she would prefer that but she said that was not necessary. -Colon cancer screening (FOBT), CT scan per smoking, mammogram ordered today  MDD (major depressive disorder), recurrent severe, without psychosis (HCC) PHQ-9 score of 3 today, some intermittent sadness but otherwise she reports feeling quite well.  She sees a psychiatrist every 3 months and is on Abilify. - Continue Abilify 5 daily  RTC 3  months HLD   Katheran James, D.O. [facility] Internal Medicine, PGY-1 Phone: [phone] Date 06/07/2023 Time 3:33 PM

## 2023-06-07 NOTE — Patient Instructions (Signed)
Stop metformin  Please take crestor every day  Return in 3 months for blood work

## 2023-06-14 NOTE — Progress Notes (Signed)
Internal Medicine Clinic Attending  Case discussed with the resident at the time of the visit.  We reviewed the resident's history and exam and pertinent patient test results.  I agree with the assessment, diagnosis, and plan of care documented in the resident's note.  

## 2023-06-21 ENCOUNTER — Ambulatory Visit
Admission: RE | Admit: 2023-06-21 | Discharge: 2023-06-21 | Disposition: A | Discharge status: Home / Self Care | Payer: MEDICAID | Source: Ambulatory Visit | Attending: Student in an Organized Health Care Education/Training Program | Admitting: Student in an Organized Health Care Education/Training Program

## 2023-06-21 DIAGNOSIS — Z122 Encounter for screening for malignant neoplasm of respiratory organs: Secondary | ICD-10-CM

## 2023-07-12 ENCOUNTER — Ambulatory Visit: Payer: MEDICAID

## 2023-07-25 ENCOUNTER — Ambulatory Visit
Admission: RE | Admit: 2023-07-25 | Discharge: 2023-07-25 | Disposition: A | Discharge status: Home / Self Care | Payer: MEDICAID | Source: Ambulatory Visit | Attending: Student in an Organized Health Care Education/Training Program | Admitting: Student in an Organized Health Care Education/Training Program

## 2023-07-25 DIAGNOSIS — Z1239 Encounter for other screening for malignant neoplasm of breast: Secondary | ICD-10-CM

## 2024-03-12 ENCOUNTER — Ambulatory Visit: Payer: MEDICAID | Admitting: Student

## 2024-03-12 VITALS — BP 124/78 | HR 75 | Temp 97.9°F | Ht 61.0 in | Wt 173.6 lb

## 2024-03-12 DIAGNOSIS — Z79899 Other long term (current) drug therapy: Secondary | ICD-10-CM

## 2024-03-12 DIAGNOSIS — K769 Liver disease, unspecified: Secondary | ICD-10-CM

## 2024-03-12 DIAGNOSIS — Z01818 Encounter for other preprocedural examination: Secondary | ICD-10-CM | POA: Diagnosis not present

## 2024-03-12 DIAGNOSIS — F17210 Nicotine dependence, cigarettes, uncomplicated: Secondary | ICD-10-CM

## 2024-03-12 DIAGNOSIS — E118 Type 2 diabetes mellitus with unspecified complications: Secondary | ICD-10-CM

## 2024-03-12 DIAGNOSIS — F17200 Nicotine dependence, unspecified, uncomplicated: Secondary | ICD-10-CM

## 2024-03-12 DIAGNOSIS — E039 Hypothyroidism, unspecified: Secondary | ICD-10-CM

## 2024-03-12 DIAGNOSIS — E890 Postprocedural hypothyroidism: Secondary | ICD-10-CM | POA: Diagnosis not present

## 2024-03-12 DIAGNOSIS — E782 Mixed hyperlipidemia: Secondary | ICD-10-CM | POA: Diagnosis not present

## 2024-03-12 DIAGNOSIS — Z79890 Hormone replacement therapy (postmenopausal): Secondary | ICD-10-CM

## 2024-03-12 LAB — POCT GLYCOSYLATED HEMOGLOBIN (HGB A1C): HbA1c, POC (controlled diabetic range): 6.2 % (ref 0.0–7.0)

## 2024-03-12 LAB — GLUCOSE, CAPILLARY: Glucose-Capillary: 89 mg/dL (ref 70–99)

## 2024-03-12 MED ORDER — ROSUVASTATIN CALCIUM 20 MG PO TABS: 20.0000 mg | ORAL_TABLET | Freq: Every day | ORAL | 3 refills | Status: AC

## 2024-03-12 NOTE — Patient Instructions (Signed)
 Thank you, Ms.Scotlynn L Bugaj for allowing us  to provide your care today. Today we discussed your upcoming knee surgery.    I have ordered the following labs for you:   Lab Orders         Basic metabolic panel with GFR         Microalbumin / Creatinine Urine Ratio         TSH         Lipid Profile         Glucose, capillary         Protime-INR         POC Hbg A1C       Referrals ordered today:    Referral Orders         Ambulatory referral to Ophthalmology      I have ordered the following medication/changed the following medications:   Stop the following medications: Medications Discontinued During This Encounter  Medication Reason   oxyCODONE -acetaminophen  (PERCOCET/ROXICET) 5-325 MG tablet    rosuvastatin  (CRESTOR ) 20 MG tablet Reorder     Start the following medications: Meds ordered this encounter  Medications   rosuvastatin  (CRESTOR ) 20 MG tablet    Sig: Take 1 tablet (20 mg total) by mouth daily.    Dispense:  90 tablet    Refill:  3     Follow up: 6 months    Remember: To quit smocking at least 2 weeks before your surgery and to stop drinking alcohol   Should you have any questions or concerns please call the internal medicine clinic at [phone].     Please note that our late policy has changed.  If you are more than 15 minutes late to your appointment, you may be asked to reschedule your appointment.  Dr. Kandis, D.O. [facility]

## 2024-03-12 NOTE — Progress Notes (Unsigned)
 Established Patient Office Visit  Subjective   Patient ID: [name], female    DOB: Sep 13, 1960  Age: 63 y.o. MRN: [id number]  Chief Complaint  Patient presents with   Routine Checkup     Want to discuss about medical clearance for left knee.    Diabetes    [name] is a 63 y.o. who presents to the clinic for preoperative medical clearance prior to total left knee arthroplasty. Please see problem based assessment and plan for additional details.   Patient Active Problem List   Diagnosis Date Noted   Preoperative examination 03/13/2024   Onychomycosis 11/28/2022   Controlled type 2 diabetes mellitus with complication, without long-term current use of insulin (HCC) 08/10/2020   Bilateral primary osteoarthritis of knee 05/09/2018   MDD (major depressive disorder), recurrent severe, without psychosis (HCC) 11/03/2016   Poor dentition 06/26/2016   Hyperlipidemia 06/26/2016   Chronic liver disease 08/29/2015   Health care maintenance 12/31/2013   Alcohol use disorder, moderate, dependence (HCC)    Primary vulvar squamous cell carcinoma s/p resection 2011 02/13/2010   Postablative hypothyroidism 04/12/2006   TOBACCO ABUSE 04/12/2006     Objective:     BP 124/78 (BP Location: Left Arm, Patient Position: Sitting, Cuff Size: Normal)   Pulse 75   Temp 97.9 F (36.6 C) (Oral)   Ht 5' 1 (1.549 m)   Wt 173 lb 9.6 oz (78.7 kg)   SpO2 97%   BMI 32.80 kg/m  BP Readings from Last 3 Encounters:  03/12/24 124/78  06/07/23 128/78  04/10/23 132/68   Wt Readings from Last 3 Encounters:  03/12/24 173 lb 9.6 oz (78.7 kg)  06/07/23 165 lb 8 oz (75.1 kg)  02/11/23 168 lb 11.2 oz (76.5 kg)      Physical Exam Vitals reviewed.  Constitutional:      General: She is not in acute distress.    Appearance: She is not ill-appearing, toxic-appearing or diaphoretic.  Cardiovascular:     Rate and Rhythm: Normal rate and regular rhythm.     Heart sounds: No murmur  heard. Pulmonary:     Effort: Pulmonary effort is normal. No respiratory distress.     Breath sounds: Normal breath sounds. No wheezing.  Musculoskeletal:     Right lower leg: No edema.     Left lower leg: No edema.  Skin:    General: Skin is warm and dry.  Neurological:     Mental Status: She is alert.  Psychiatric:        Mood and Affect: Mood and affect normal.      Results for orders placed or performed in visit on 03/12/24  Basic metabolic panel with GFR  Result Value Ref Range   Glucose 85 70 - 99 mg/dL   BUN 16 8 - 27 mg/dL   Creatinine, Ser 8.96 (H) 0.57 - 1.00 mg/dL   eGFR 61 >40 fO/fpw/8.26   BUN/Creatinine Ratio 16 12 - 28   Sodium 141 134 - 144 mmol/L   Potassium 4.3 3.5 - 5.2 mmol/L   Chloride 105 96 - 106 mmol/L   CO2 21 20 - 29 mmol/L   Calcium  8.8 8.7 - 10.3 mg/dL  Microalbumin / Creatinine Urine Ratio  Result Value Ref Range   Creatinine, Urine 203.0 Not Estab. mg/dL   Microalbumin, Urine 6.3 Not Estab. ug/mL   Microalb/Creat Ratio 3 0 - 29 mg/g creat  TSH  Result Value Ref Range   TSH 5.850 (H) 0.450 -  4.500 uIU/mL  Lipid Profile  Result Value Ref Range   Cholesterol, Total 165 100 - 199 mg/dL   Triglycerides 859 0 - 149 mg/dL   HDL 41 >60 mg/dL   VLDL Cholesterol Cal 25 5 - 40 mg/dL   LDL Chol Calc (NIH) 99 0 - 99 mg/dL   Chol/HDL Ratio 4.0 0.0 - 4.4 ratio  Glucose, capillary  Result Value Ref Range   Glucose-Capillary 89 70 - 99 mg/dL  Protime-INR  Result Value Ref Range   INR 0.9 0.9 - 1.2   Prothrombin Time 10.3 9.1 - 12.0 sec  POC Hbg A1C  Result Value Ref Range   Hemoglobin A1C     HbA1c POC (<> result, manual entry)     HbA1c, POC (prediabetic range)     HbA1c, POC (controlled diabetic range) 6.2 0.0 - 7.0 %    Last metabolic panel Lab Results  Component Value Date   GLUCOSE 85 03/12/2024   NA 141 03/12/2024   K 4.3 03/12/2024   CL 105 03/12/2024   CO2 21 03/12/2024   BUN 16 03/12/2024   CREATININE 1.03 (H) 03/12/2024    EGFR 61 03/12/2024   CALCIUM  8.8 03/12/2024   PHOS 2.8 (L) 11/28/2022   PROT 6.5 11/28/2022   ALBUMIN 4.2 11/28/2022   LABGLOB 2.3 11/28/2022   AGRATIO 1.8 11/28/2022   BILITOT 0.4 11/28/2022   ALKPHOS 78 11/28/2022   AST 28 11/28/2022   ALT 39 (H) 11/28/2022   ANIONGAP 13 10/04/2018   Last lipids Lab Results  Component Value Date   CHOL 165 03/12/2024   HDL 41 03/12/2024   LDLCALC 99 03/12/2024   TRIG 140 03/12/2024   CHOLHDL 4.0 03/12/2024   Last hemoglobin A1c Lab Results  Component Value Date   HGBA1C 6.2 03/12/2024      The 10-year ASCVD risk score (Arnett DK, et al., 2019) is: 23.4%    Assessment & Plan:   Problem List Items Addressed This Visit       Digestive   Chronic liver disease   With patient's chronic liver disease and upcoming total knee replacement surgery, and INR was obtained during appointment.  Fortunately INR is 0.9.  Patient reports drinking 6 beers on the weekend, we discussed importance of alcohol cessation with a history of chronic liver disease.      Relevant Orders   Protime-INR (Completed)     Endocrine   Postablative hypothyroidism   Patient presents with a history of post ablative hypothyroidism.  She is currently on a dose of 88 mcg of Synthroid .  Her last TSH was checked in May 2024 with a TSH of 3.480.  She denies any symptoms of hypothyroidism today.    TSH was repeated during her appointment and was increased to 5.850.  I spoke with the patient on the phone in regards to her lab result, and she does report periodic noncompliance of Synthroid , up to a few times per month.    Given her lack of symptoms and mild elevation in TSH on lab: Will obtain a T4 and repeat TSH in 4 to 6 weeks to assess for improvement in TSH.  Patient was reminded on the importance of compliance with Synthroid .      Controlled type 2 diabetes mellitus with complication, without long-term current use of insulin (HCC) - Primary   Patient presents with a  history of T2DM with a prior A1c of 5.9 in December 2024.  Patient's A1c today is 6.2.  They are  not taking any medications for type 2 diabetes, she is currently diet controlled. Plan: -Will continue diet control of diabetes -A1c today -Ophthalmology referral sent  -Urine ACR ordered       Relevant Medications   rosuvastatin  (CRESTOR ) 20 MG tablet   Other Relevant Orders   POC Hbg A1C (Completed)   Microalbumin / Creatinine Urine Ratio (Completed)   Ambulatory referral to Ophthalmology   Lipid Profile (Completed)     Other   TOBACCO ABUSE (Chronic)   Patient declines interest in tobacco cessation.      Hyperlipidemia   Patient has a history of hyperlipidemia with her last LDL of 117 in May 2024.  Since then, her Crestor  was increased from 10 to 20 mg.  LDL obtained during appointment has improved to 99. Plan: - Continue Crestor  20 mg, as she is at goal of LDL of less than 100 given her history of diabetes.       Relevant Medications   rosuvastatin  (CRESTOR ) 20 MG tablet   Other Relevant Orders   Lipid Profile (Completed)   Preoperative examination   Patient presents for a preoperative examination prior to a left total knee arthroplasty.  Patient has good functional status, she denies angina or shortness of breath.  Patient's RCRI was calculated at 0 placing her at a low risk for adverse cardiac events within 30 days from her surgical procedure.  Patient reports tobacco use, I did educate her on the importance of tobacco cessation prior to her left total knee arthroplasty.  Plan: - A1c obtained, BMP obtained, preoperative clearance form was filled out and will be faxed back to her orthopedic surgeon      Other Visit Diagnoses       Preoperative clearance       Relevant Orders   Basic metabolic panel with GFR (Completed)   Protime-INR (Completed)     Hypothyroidism, unspecified type       Relevant Orders   TSH (Completed)       Return in about 6 months (around  09/09/2024).    Damien Lease, DO

## 2024-03-13 ENCOUNTER — Ambulatory Visit: Payer: Self-pay | Admitting: Student

## 2024-03-13 DIAGNOSIS — Z01818 Encounter for other preprocedural examination: Secondary | ICD-10-CM | POA: Insufficient documentation

## 2024-03-13 DIAGNOSIS — R7989 Other specified abnormal findings of blood chemistry: Secondary | ICD-10-CM

## 2024-03-13 DIAGNOSIS — E039 Hypothyroidism, unspecified: Secondary | ICD-10-CM

## 2024-03-13 LAB — BASIC METABOLIC PANEL WITH GFR
BUN/Creatinine Ratio: 16 (ref 12–28)
BUN: 16 mg/dL (ref 8–27)
CO2: 21 mmol/L (ref 20–29)
Calcium: 8.8 mg/dL (ref 8.7–10.3)
Chloride: 105 mmol/L (ref 96–106)
Creatinine, Ser: 1.03 mg/dL — ABNORMAL HIGH (ref 0.57–1.00)
Glucose: 85 mg/dL (ref 70–99)
Potassium: 4.3 mmol/L (ref 3.5–5.2)
Sodium: 141 mmol/L (ref 134–144)
eGFR: 61 mL/min/1.73 (ref >59)

## 2024-03-13 LAB — LIPID PANEL
Chol/HDL Ratio: 4 ratio (ref 0.0–4.4)
Cholesterol, Total: 165 mg/dL (ref 100–199)
HDL: 41 mg/dL (ref >39)
LDL Chol Calc (NIH): 99 mg/dL (ref 0–99)
Triglycerides: 140 mg/dL (ref 0–149)
VLDL Cholesterol Cal: 25 mg/dL (ref 5–40)

## 2024-03-13 LAB — MICROALBUMIN / CREATININE URINE RATIO
Creatinine, Urine: 203 mg/dL
Microalb/Creat Ratio: 3 mg/g{creat} (ref 0–29)
Microalbumin, Urine: 6.3 ug/mL

## 2024-03-13 LAB — PROTIME-INR
INR: 0.9 (ref 0.9–1.2)
Prothrombin Time: 10.3 s (ref 9.1–12.0)

## 2024-03-13 LAB — TSH: TSH: 5.85 u[IU]/mL — ABNORMAL HIGH (ref 0.450–4.500)

## 2024-03-13 NOTE — Assessment & Plan Note (Signed)
 Patient presents with a history of T2DM with a prior A1c of 5.9 in December 2024.  Patient's A1c today is 6.2.  They are not taking any medications for type 2 diabetes, she is currently diet controlled. Plan: -Will continue diet control of diabetes -A1c today -Ophthalmology referral sent  -Urine ACR ordered

## 2024-03-13 NOTE — Assessment & Plan Note (Signed)
 Patient declines interest in tobacco cessation.

## 2024-03-13 NOTE — Assessment & Plan Note (Addendum)
 Patient has a history of hyperlipidemia with her last LDL of 117 in May 2024.  Since then, her Crestor  was increased from 10 to 20 mg.  LDL obtained during appointment has improved to 99. Plan: - Continue Crestor  20 mg, as she is at goal of LDL of less than 100 given her history of diabetes.

## 2024-03-13 NOTE — Assessment & Plan Note (Addendum)
 Patient presents with a history of post ablative hypothyroidism.  She is currently on a dose of 88 mcg of Synthroid .  Her last TSH was checked in May 2024 with a TSH of 3.480.  She denies any symptoms of hypothyroidism today.    TSH was repeated during her appointment and was increased to 5.850.  I spoke with the patient on the phone in regards to her lab result, and she does report periodic noncompliance of Synthroid , up to a few times per month.    Given her lack of symptoms and mild elevation in TSH on lab: Will obtain a T4 and repeat TSH in 4 to 6 weeks to assess for improvement in TSH.  Patient was reminded on the importance of compliance with Synthroid .

## 2024-03-13 NOTE — Assessment & Plan Note (Signed)
 With patient's chronic liver disease and upcoming total knee replacement surgery, and INR was obtained during appointment.  Fortunately INR is 0.9.  Patient reports drinking 6 beers on the weekend, we discussed importance of alcohol cessation with a history of chronic liver disease.

## 2024-03-13 NOTE — Assessment & Plan Note (Signed)
 Patient presents for a preoperative examination prior to a left total knee arthroplasty.  Patient has good functional status, she denies angina or shortness of breath.  Patient's RCRI was calculated at 0 placing her at a low risk for adverse cardiac events within 30 days from her surgical procedure.  Patient reports tobacco use, I did educate her on the importance of tobacco cessation prior to her left total knee arthroplasty.  Plan: - A1c obtained, BMP obtained, preoperative clearance form was filled out and will be faxed back to her orthopedic surgeon

## 2024-03-17 NOTE — Progress Notes (Signed)
 Internal Medicine Clinic Attending  Case discussed with the resident at the time of the visit.  We reviewed the resident's history and exam and pertinent patient test results.  I agree with the assessment, diagnosis, and plan of care documented in the resident's note.

## 2024-03-18 ENCOUNTER — Other Ambulatory Visit: Payer: Self-pay | Admitting: Student

## 2024-03-18 DIAGNOSIS — E890 Postprocedural hypothyroidism: Secondary | ICD-10-CM

## 2024-03-18 NOTE — Telephone Encounter (Signed)
 Medication sent to pharmacy

## 2024-03-23 ENCOUNTER — Other Ambulatory Visit: Payer: Self-pay | Admitting: Student

## 2024-03-23 DIAGNOSIS — E890 Postprocedural hypothyroidism: Secondary | ICD-10-CM

## 2024-04-06 NOTE — Telephone Encounter (Signed)
 Forwarded message to [employer].  FYI Only:   OV notes have not be rec'd per [facility].  The office rec'd the Clear.Letter  back.   Copied from CRM #[id number]. Topic: Referral - Question >> Apr 01, 2024 10:32 AM Graeme ORN wrote: Reason for CRM:  Dagoberto from [facility] called. Received clearance but no note was included. Requesting last office visit note. Trying to schedule surgery for Oct 27th Fax: [phone]

## 2024-04-14 ENCOUNTER — Other Ambulatory Visit: Payer: Self-pay | Admitting: Orthopedic Surgery

## 2024-04-20 NOTE — Progress Notes (Signed)
  Date of COVID positive in last 90 days:  No  PCP - [facility]ologist - N/A  Chest x-ray - N/A EKG - 04/21/24 Epic Stress Test - N/A ECHO - N/A Cardiac Cath - N/A Pacemaker/ICD device last checked:N/A Spinal Cord Stimulator:N/A  Bowel Prep - N/A  Sleep Study - N/A CPAP -   Fasting Blood Sugar - Does not check  Checks Blood Sugar _____ times a day  Last dose of GLP1 agonist-  N/A GLP1 instructions:  Do not take after     Last dose of SGLT-2 inhibitors-  N/A SGLT-2 instructions:  Do not take after    Blood Thinner Instructions: N/A Last dose:   Time: Aspirin  Instructions:N/A Last Dose:  Activity level:  Difficulty climbing stairs due to knee pain.  Able to perform activities of daily living without stopping and without symptoms of chest pain or shortness of breath.  Anesthesia review: N/A  Patient denies shortness of breath, fever, cough and chest pain at PAT appointment  Patient verbalized understanding of instructions that were given to them at the PAT appointment. Patient was also instructed that they will need to review over the PAT instructions again at home before surgery.

## 2024-04-20 NOTE — Patient Instructions (Addendum)
 SURGICAL WAITING ROOM VISITATION Patients having surgery or a procedure may have no more than 2 support people in the waiting area - these visitors may rotate.    Children under the age of 24 must have an adult with them who is not the patient.  If the patient needs to stay at the hospital during part of their recovery, the visitor guidelines for inpatient rooms apply. Pre-op nurse will coordinate an appropriate time for 1 support person to accompany patient in pre-op.  This support person may not rotate.    Please refer to the [facility] website for the visitor guidelines for Inpatients (after your surgery is over and you are in a regular room).       Your procedure is scheduled on: 04-27-24   Report to [facility] Main Entrance    Report to admitting at 5:15 AM   Call this number if you have problems the morning of surgery [phone]   Do not eat food :After Midnight.   After Midnight you may have the following liquids until 4:15 AM DAY OF SURGERY  Water  Non-Citrus Juices (without pulp, NO RED-Apple, White grape, White cranberry) Black Coffee (NO MILK/CREAM OR CREAMERS, sugar ok)  Clear Tea (NO MILK/CREAM OR CREAMERS, sugar ok) regular and decaf                             Plain Jell-O (NO RED)                                           Fruit ices (not with fruit pulp, NO RED)                                     Popsicles (NO RED)                                                               Sports drinks like Gatorade (NO RED)                  If you have questions, please contact your surgeon's office.   FOLLOW  ANY ADDITIONAL PRE OP INSTRUCTIONS YOU RECEIVED FROM YOUR SURGEON'S OFFICE!!!     Oral Hygiene is also important to reduce your risk of infection.                                    Remember - BRUSH YOUR TEETH THE MORNING OF SURGERY WITH YOUR REGULAR TOOTHPASTE   Do NOT smoke after Midnight   Take these medicines the morning of surgery with A SIP OF  WATER :    Aripiprazole    Levothyroxine    Rosuvastatin   Stop all vitamins and herbal supplements 7 days before surgery                              You may not have any metal on your body including hair pins, jewelry, and body piercing  Do not wear make-up, lotions, powders, perfumes, or deodorant  Do not wear nail polish including gel and S&S, artificial/acrylic nails, or any other type of covering on natural nails including finger and toenails. If you have artificial nails, gel coating, etc. that needs to be removed by a nail salon please have this removed prior to surgery or surgery may need to be canceled/ delayed if the surgeon/ anesthesia feels like they are unable to be safely monitored.   Do not shave  48 hours prior to surgery.       Do not bring valuables to the hospital. [facility] IS NOT RESPONSIBLE   FOR VALUABLES.   Contacts, dentures or bridgework may not be worn into surgery.  DO NOT BRING YOUR HOME MEDICATIONS TO THE HOSPITAL. PHARMACY WILL DISPENSE MEDICATIONS LISTED ON YOUR MEDICATION LIST TO YOU DURING YOUR ADMISSION IN THE HOSPITAL!    Patients discharged on the day of surgery will not be allowed to drive home.  Someone NEEDS to stay with you for the first 24 hours after anesthesia.               Please read over the following fact sheets you were given: IF YOU HAVE QUESTIONS ABOUT YOUR PRE-OP INSTRUCTIONS PLEASE CALL [phone] G[facility]  If you received a COVID test during your pre-op visit  it is requested that you wear a mask when out in public, stay away from anyone that may not be feeling well and notify your surgeon if you develop symptoms. If you test positive for Covid or have been in contact with anyone that has tested positive in the last 10 days please notify you surgeon.    Pre-operative 4 CHG Bath Instructions  DYNA-Hex 4 Chlorhexidine  Gluconate 4% Solution Antiseptic 4 fl. oz   You can play a key role in reducing the risk of infection  after surgery. Your skin needs to be as free of germs as possible. You can reduce the number of germs on your skin by washing with CHG (chlorhexidine  gluconate) soap before surgery. CHG is an antiseptic soap that kills germs and continues to kill germs even after washing.   DO NOT use if you have an allergy to chlorhexidine /CHG or antibacterial soaps. If your skin becomes reddened or irritated, stop using the CHG and notify one of our RNs at   Please shower with the CHG soap starting 4 days before surgery using the following schedule:     Please keep in mind the following:  DO NOT shave, including legs and underarms, starting the day of your first shower.   You may shave your face at any point before/day of surgery.  Place clean sheets on your bed the day you start using CHG soap. Use a clean washcloth (not used since being washed) for each shower. DO NOT sleep with pets once you start using the CHG.  CHG Shower Instructions:  If you choose to wash your hair and private area, wash first with your normal shampoo/soap.  After you use shampoo/soap, rinse your hair and body thoroughly to remove shampoo/soap residue.  Turn the water  OFF and apply about 3 tablespoons (45 ml) of CHG soap to a CLEAN washcloth.  Apply CHG soap ONLY FROM YOUR NECK DOWN TO YOUR TOES (washing for 3-5 minutes)  DO NOT use CHG soap on face, private areas, open wounds, or sores.  Pay special attention to the area where your surgery is being performed.  If you are having back surgery, having someone  wash your back for you may be helpful. Wait 2 minutes after CHG soap is applied, then you may rinse off the CHG soap.  Pat dry with a clean towel  Put on clean clothes/pajamas   If you choose to wear lotion, please use ONLY the CHG-compatible lotions on the back of this paper.     Additional instructions for the day of surgery: DO NOT APPLY any lotions, deodorants, cologne, or perfumes.   Put on clean/comfortable clothes.   Brush your teeth.  Ask your nurse before applying any prescription medications to the skin.   CHG Compatible Lotions   Aveeno Moisturizing lotion  Cetaphil Moisturizing Cream  Cetaphil Moisturizing Lotion  Clairol Herbal Essence Moisturizing Lotion, Dry Skin  Clairol Herbal Essence Moisturizing Lotion, Extra Dry Skin  Clairol Herbal Essence Moisturizing Lotion, Normal Skin  Curel Age Defying Therapeutic Moisturizing Lotion with Alpha Hydroxy  Curel Extreme Care Body Lotion  Curel Soothing Hands Moisturizing Hand Lotion  Curel Therapeutic Moisturizing Cream, Fragrance-Free  Curel Therapeutic Moisturizing Lotion, Fragrance-Free  Curel Therapeutic Moisturizing Lotion, Original Formula  Eucerin Daily Replenishing Lotion  Eucerin Dry Skin Therapy Plus Alpha Hydroxy Crme  Eucerin Dry Skin Therapy Plus Alpha Hydroxy Lotion  Eucerin Original Crme  Eucerin Original Lotion  Eucerin Plus Crme Eucerin Plus Lotion  Eucerin TriLipid Replenishing Lotion  Keri Anti-Bacterial Hand Lotion  Keri Deep Conditioning Original Lotion Dry Skin Formula Softly Scented  Keri Deep Conditioning Original Lotion, Fragrance Free Sensitive Skin Formula  Keri Lotion Fast Absorbing Fragrance Free Sensitive Skin Formula  Keri Lotion Fast Absorbing Softly Scented Dry Skin Formula  Keri Original Lotion  Keri Skin Renewal Lotion Keri Silky Smooth Lotion  Keri Silky Smooth Sensitive Skin Lotion  Nivea Body Creamy Conditioning Oil  Nivea Body Extra Enriched Lotion  Nivea Body Original Lotion  Nivea Body Sheer Moisturizing Lotion Nivea Crme  Nivea Skin Firming Lotion  NutraDerm 30 Skin Lotion  NutraDerm Skin Lotion  NutraDerm Therapeutic Skin Cream  NutraDerm Therapeutic Skin Lotion  ProShield Protective Hand Cream  Provon moisturizing lotion   PATIENT SIGNATURE_________________________________  NURSE  SIGNATURE__________________________________  ________________________________________________________________________    Nasario Exon  An incentive spirometer is a tool that can help keep your lungs clear and active. This tool measures how well you are filling your lungs with each breath. Taking long deep breaths may help reverse or decrease the chance of developing breathing (pulmonary) problems (especially infection) following: A long period of time when you are unable to move or be active. BEFORE THE PROCEDURE  If the spirometer includes an indicator to show your best effort, your nurse or respiratory therapist will set it to a desired goal. If possible, sit up straight or lean slightly forward. Try not to slouch. Hold the incentive spirometer in an upright position. INSTRUCTIONS FOR USE  Sit on the edge of your bed if possible, or sit up as far as you can in bed or on a chair. Hold the incentive spirometer in an upright position. Breathe out normally. Place the mouthpiece in your mouth and seal your lips tightly around it. Breathe in slowly and as deeply as possible, raising the piston or the ball toward the top of the column. Hold your breath for 3-5 seconds or for as long as possible. Allow the piston or ball to fall to the bottom of the column. Remove the mouthpiece from your mouth and breathe out normally. Rest for a few seconds and repeat Steps 1 through 7 at least 10 times every 1-2  hours when you are awake. Take your time and take a few normal breaths between deep breaths. The spirometer may include an indicator to show your best effort. Use the indicator as a goal to work toward during each repetition. After each set of 10 deep breaths, practice coughing to be sure your lungs are clear. If you have an incision (the cut made at the time of surgery), support your incision when coughing by placing a pillow or rolled up towels firmly against it. Once you are able to get out of  bed, walk around indoors and cough well. You may stop using the incentive spirometer when instructed by your caregiver.  RISKS AND COMPLICATIONS Take your time so you do not get dizzy or light-headed. If you are in pain, you may need to take or ask for pain medication before doing incentive spirometry. It is harder to take a deep breath if you are having pain. AFTER USE Rest and breathe slowly and easily. It can be helpful to keep track of a log of your progress. Your caregiver can provide you with a simple table to help with this. If you are using the spirometer at home, follow these instructions: SEEK MEDICAL CARE IF:  You are having difficultly using the spirometer. You have trouble using the spirometer as often as instructed. Your pain medication is not giving enough relief while using the spirometer. You develop fever of 100.5 F (38.1 C) or higher. SEEK IMMEDIATE MEDICAL CARE IF:  You cough up bloody sputum that had not been present before. You develop fever of 102 F (38.9 C) or greater. You develop worsening pain at or near the incision site. MAKE SURE YOU:  Understand these instructions. Will watch your condition. Will get help right away if you are not doing well or get worse. Document Released: 10/29/2006 Document Revised: 09/10/2011 Document Reviewed: 12/30/2006 [facility] Patient Information 2014 [place], [place].

## 2024-04-21 ENCOUNTER — Other Ambulatory Visit: Payer: Self-pay

## 2024-04-21 ENCOUNTER — Encounter (HOSPITAL_COMMUNITY): Payer: Self-pay

## 2024-04-21 ENCOUNTER — Encounter (HOSPITAL_COMMUNITY)
Admission: RE | Admit: 2024-04-21 | Discharge: 2024-04-21 | Disposition: A | Discharge status: Home / Self Care | Payer: MEDICAID | Source: Ambulatory Visit | Attending: Orthopedic Surgery | Admitting: Orthopedic Surgery

## 2024-04-21 VITALS — BP 152/56 | HR 66 | Temp 98.1°F | Resp 18 | Ht 61.0 in | Wt 174.8 lb

## 2024-04-21 DIAGNOSIS — K769 Liver disease, unspecified: Secondary | ICD-10-CM | POA: Insufficient documentation

## 2024-04-21 DIAGNOSIS — Z01818 Encounter for other preprocedural examination: Secondary | ICD-10-CM | POA: Insufficient documentation

## 2024-04-21 DIAGNOSIS — E119 Type 2 diabetes mellitus without complications: Secondary | ICD-10-CM | POA: Diagnosis not present

## 2024-04-21 DIAGNOSIS — Z01810 Encounter for preprocedural cardiovascular examination: Secondary | ICD-10-CM | POA: Diagnosis present

## 2024-04-21 DIAGNOSIS — Z01812 Encounter for preprocedural laboratory examination: Secondary | ICD-10-CM | POA: Diagnosis present

## 2024-04-21 HISTORY — DX: Unspecified osteoarthritis, unspecified site: M19.90

## 2024-04-21 LAB — COMPREHENSIVE METABOLIC PANEL WITH GFR
ALT: 75 U/L — ABNORMAL HIGH (ref 0–44)
AST: 47 U/L — ABNORMAL HIGH (ref 15–41)
Albumin: 4.2 g/dL (ref 3.5–5.0)
Alkaline Phosphatase: 85 U/L (ref 38–126)
Anion gap: 11 (ref 5–15)
BUN: 10 mg/dL (ref 8–23)
CO2: 28 mmol/L (ref 22–32)
Calcium: 10 mg/dL (ref 8.9–10.3)
Chloride: 106 mmol/L (ref 98–111)
Creatinine, Ser: 0.73 mg/dL (ref 0.44–1.00)
GFR, Estimated: >60 mL/min (ref >60)
Glucose, Bld: 90 mg/dL (ref 70–99)
Potassium: 3.3 mmol/L — ABNORMAL LOW (ref 3.5–5.1)
Sodium: 144 mmol/L (ref 135–145)
Total Bilirubin: 0.3 mg/dL (ref 0.0–1.2)
Total Protein: 7.1 g/dL (ref 6.5–8.1)

## 2024-04-21 LAB — CBC
HCT: 41.8 % (ref 36.0–46.0)
Hemoglobin: 13.3 g/dL (ref 12.0–15.0)
MCH: 27.8 pg (ref 26.0–34.0)
MCHC: 31.8 g/dL (ref 30.0–36.0)
MCV: 87.4 fL (ref 80.0–100.0)
Platelets: 347 K/uL (ref 150–400)
RBC: 4.78 MIL/uL (ref 3.87–5.11)
RDW: 13.4 % (ref 11.5–15.5)
WBC: 9.1 K/uL (ref 4.0–10.5)
nRBC: 0 % (ref 0.0–0.2)

## 2024-04-21 LAB — SURGICAL PCR SCREEN
MRSA, PCR: POSITIVE — AB
Staphylococcus aureus: POSITIVE — AB

## 2024-04-21 LAB — GLUCOSE, CAPILLARY: Glucose-Capillary: 108 mg/dL — ABNORMAL HIGH (ref 70–99)

## 2024-04-21 NOTE — Progress Notes (Signed)
PCR results sent to Dr. Luiz Blare to review. ?

## 2024-04-25 NOTE — Anesthesia Preprocedure Evaluation (Addendum)
 Anesthesia Evaluation  Patient identified by MRN, date of birth, ID band Patient awake    Reviewed: Allergy & Precautions, NPO status , Patient's Chart, lab work & pertinent test results  Airway Mallampati: II  TM Distance: >3 FB     Dental no notable dental hx. (+) Missing, Dental Advisory Given, Edentulous Upper, Loose   Pulmonary Current Smoker and Patient abstained from smoking.   breath sounds clear to auscultation + decreased breath sounds      Cardiovascular negative cardio ROS Normal cardiovascular exam Rhythm:Regular Rate:Normal  EKG 04/21/24 NSR, Normal   Neuro/Psych  PSYCHIATRIC DISORDERS  Depression    negative neurological ROS     GI/Hepatic negative GI ROS,,,(+)     substance abuse  alcohol use and cocaine useElevated LFT's   Endo/Other  diabetes, Well Controlled, Type 2, Oral Hypoglycemic AgentsHypothyroidism  HLD Obesity  Renal/GU negative Renal ROS  negative genitourinary   Musculoskeletal  (+) Arthritis , Osteoarthritis,  OA left knee   Abdominal  (+) + obese  Peds  Hematology negative hematology ROS (+)   Anesthesia Other Findings   Reproductive/Obstetrics Hx/o Vulvar Ca                              Anesthesia Physical Anesthesia Plan  ASA: 3  Anesthesia Plan: Spinal   Post-op Pain Management: Regional block*, Minimal or no pain anticipated and Dilaudid  IV   Induction: Intravenous  PONV Risk Score and Plan: 2 and Treatment may vary due to age or medical condition, Propofol  infusion, Ondansetron  and Dexamethasone   Airway Management Planned: Natural Airway, Nasal Cannula and Simple Face Mask  Additional Equipment: None  Intra-op Plan:   Post-operative Plan:   Informed Consent: I have reviewed the patients History and Physical, chart, labs and discussed the procedure including the risks, benefits and alternatives for the proposed anesthesia with the  patient or authorized representative who has indicated his/her understanding and acceptance.     Dental advisory given  Plan Discussed with: Anesthesiologist and CRNA  Anesthesia Plan Comments:          Anesthesia Quick Evaluation

## 2024-04-26 NOTE — H&P (Signed)
 TOTAL KNEE ADMISSION H&P  Patient is being admitted for left total knee arthroplasty.  Subjective:  Chief Complaint:left knee pain.  HPI: [name], 63 y.o. female, has a history of pain and functional disability in the left knee due to arthritis and has failed non-surgical conservative treatments for greater than 12 weeks to includeNSAID's and/or analgesics, corticosteriod injections, flexibility and strengthening excercises, weight reduction as appropriate, and activity modification.  Onset of symptoms was gradual, starting several years ago with gradually worsening course since that time. The patient noted no past surgery on the left knee(s).  Patient currently rates pain in the left knee(s) at 10 out of 10 with activity. Patient has night pain, worsening of pain with activity and weight bearing, pain that interferes with activities of daily living, pain with passive range of motion, crepitus, and joint swelling.  Patient has evidence of periarticular osteophytes and joint space narrowing by imaging studies. There is no active infection.  Patient Active Problem List   Diagnosis Date Noted   Preoperative examination 03/13/2024   Onychomycosis 11/28/2022   Controlled type 2 diabetes mellitus with complication, without long-term current use of insulin (HCC) 08/10/2020   Bilateral primary osteoarthritis of knee 05/09/2018   Degenerative arthritis of left knee 09/12/2017   MDD (major depressive disorder), recurrent severe, without psychosis (HCC) 11/03/2016   Poor dentition 06/26/2016   Hyperlipidemia 06/26/2016   Chronic liver disease 08/29/2015   Health care maintenance 12/31/2013   Alcohol use disorder, moderate, dependence (HCC)    Primary vulvar squamous cell carcinoma s/p resection 2011 02/13/2010   Postablative hypothyroidism 04/12/2006   TOBACCO ABUSE 04/12/2006   Past Medical History:  Diagnosis Date   Arthritis    Cocaine abuse (HCC)    Depression    Diabetes mellitus  without complication (HCC)    ETOH abuse    Hypothyroidism    Unspecified mood (affective) disorder     Past Surgical History:  Procedure Laterality Date   radioactive iodine ablation     graves disease s/p   TOTAL KNEE ARTHROPLASTY Right 05/09/2018   Procedure: TOTAL KNEE ARTHROPLASTY;  Surgeon: Yvone Rush, MD;  Location: [facility];  Service: Orthopedics;  Laterality: Right;    No current facility-administered medications for this encounter.   Current Outpatient Medications  Medication Sig Dispense Refill Last Dose/Taking   ARIPiprazole  (ABILIFY ) 5 MG tablet Take 5 mg by mouth daily.      levothyroxine  (SYNTHROID ) 88 MCG tablet Take 1 tablet by mouth once daily 90 tablet 0    rosuvastatin  (CRESTOR ) 20 MG tablet Take 1 tablet (20 mg total) by mouth daily. 90 tablet 3    No Known Allergies  Social History   Tobacco Use   Smoking status: Every Day    Current packs/day: 0.30    Types: Cigarettes   Smokeless tobacco: Never   Tobacco comments:    2-3 cigs / day  Substance Use Topics   Alcohol use: Yes    Alcohol/week: 4.0 standard drinks of alcohol    Types: 4 Cans of beer per week    Comment: every weekend    No family history on file.   Review of Systems  Constitutional: Negative.   HENT: Negative.    Eyes: Negative.   Respiratory: Negative.    Cardiovascular: Negative.   Gastrointestinal: Negative.   Endocrine: Negative.   Genitourinary: Negative.   Musculoskeletal:  Positive for arthralgias.  Skin: Negative.   Allergic/Immunologic: Negative.   Neurological: Negative.   Hematological: Negative.  Psychiatric/Behavioral: Negative.      Objective:  Physical Exam Constitutional:      Appearance: Normal appearance. She is obese.  HENT:     Head: Normocephalic and atraumatic.     Nose: Nose normal.  Eyes:     Pupils: Pupils are equal, round, and reactive to light.  Cardiovascular:     Pulses: Normal pulses.  Pulmonary:     Effort: Pulmonary effort is normal.   Musculoskeletal:        General: Tenderness and deformity present.     Cervical back: Normal range of motion and neck supple.     Comments: Left knee has obvious severe varus malalignment.  Pain and grinding through range of motion.  Trace of an effusion.  No erythema or warmth.  Skin:    General: Skin is warm and dry.  Neurological:     General: No focal deficit present.     Mental Status: She is alert and oriented to person, place, and time. Mental status is at baseline.  Psychiatric:        Mood and Affect: Mood normal.        Behavior: Behavior normal.        Thought Content: Thought content normal.        Judgment: Judgment normal.     Vital signs in last 24 hours:    Labs:   Estimated body mass index is 33.03 kg/m as calculated from the following:   Height as of 04/21/24: 5' 1 (1.549 m).   Weight as of 04/21/24: 79.3 kg.   Imaging Review Plain radiographs demonstrate X-rays of her left knee 4 views.  These x-rays show severe end-stage arthritis with severe bone-on-bone change.       Assessment/Plan:  End stage arthritis, left knee   The patient history, physical examination, clinical judgment of the provider and imaging studies are consistent with end stage degenerative joint disease of the left knee(s) and total knee arthroplasty is deemed medically necessary. The treatment options including medical management, injection therapy arthroscopy and arthroplasty were discussed at length. The risks and benefits of total knee arthroplasty were presented and reviewed. The risks due to aseptic loosening, infection, stiffness, patella tracking problems, thromboembolic complications and other imponderables were discussed. The patient acknowledged the explanation, agreed to proceed with the plan and consent was signed. Patient is being admitted for inpatient treatment for surgery, pain control, PT, OT, prophylactic antibiotics, VTE prophylaxis, progressive ambulation and ADL's  and discharge planning. The patient is planning to be discharged home with home health services     Patient's anticipated LOS is less than 2 midnights, meeting these requirements: - Younger than 28 - Lives within 1 hour of care - Has a competent adult at home to recover with post-op recover - NO history of  - Chronic pain requiring opiods  - Diabetes  - Coronary Artery Disease  - Heart failure  - Heart attack  - Stroke  - DVT/VTE  - Cardiac arrhythmia  - Respiratory Failure/COPD  - Renal failure  - Anemia  - Advanced Liver disease

## 2024-04-27 ENCOUNTER — Encounter (HOSPITAL_COMMUNITY): Payer: MEDICAID | Admitting: Anesthesiology

## 2024-04-27 ENCOUNTER — Encounter (HOSPITAL_COMMUNITY): Admission: RE | Disposition: A | Payer: Self-pay | Source: Ambulatory Visit | Attending: Orthopedic Surgery

## 2024-04-27 ENCOUNTER — Ambulatory Visit (HOSPITAL_COMMUNITY)
Admission: RE | Admit: 2024-04-27 | Discharge: 2024-04-27 | Disposition: A | Payer: MEDICAID | Source: Ambulatory Visit | Attending: Orthopedic Surgery | Admitting: Orthopedic Surgery

## 2024-04-27 ENCOUNTER — Encounter (HOSPITAL_COMMUNITY): Payer: Self-pay | Admitting: Orthopedic Surgery

## 2024-04-27 ENCOUNTER — Ambulatory Visit (HOSPITAL_COMMUNITY): Payer: MEDICAID | Admitting: Anesthesiology

## 2024-04-27 DIAGNOSIS — M1712 Unilateral primary osteoarthritis, left knee: Secondary | ICD-10-CM

## 2024-04-27 DIAGNOSIS — F32A Depression, unspecified: Secondary | ICD-10-CM | POA: Insufficient documentation

## 2024-04-27 DIAGNOSIS — Z7984 Long term (current) use of oral hypoglycemic drugs: Secondary | ICD-10-CM | POA: Diagnosis not present

## 2024-04-27 DIAGNOSIS — M25762 Osteophyte, left knee: Secondary | ICD-10-CM | POA: Diagnosis not present

## 2024-04-27 DIAGNOSIS — E119 Type 2 diabetes mellitus without complications: Secondary | ICD-10-CM | POA: Diagnosis not present

## 2024-04-27 DIAGNOSIS — E785 Hyperlipidemia, unspecified: Secondary | ICD-10-CM | POA: Insufficient documentation

## 2024-04-27 DIAGNOSIS — E039 Hypothyroidism, unspecified: Secondary | ICD-10-CM | POA: Insufficient documentation

## 2024-04-27 DIAGNOSIS — F17210 Nicotine dependence, cigarettes, uncomplicated: Secondary | ICD-10-CM | POA: Diagnosis not present

## 2024-04-27 HISTORY — PX: TOTAL KNEE ARTHROPLASTY: SHX125

## 2024-04-27 LAB — GLUCOSE, CAPILLARY
Glucose-Capillary: 98 mg/dL (ref 70–99)
Glucose-Capillary: 109 mg/dL — ABNORMAL HIGH (ref 70–99)

## 2024-04-27 SURGERY — ARTHROPLASTY, KNEE, TOTAL
Anesthesia: Spinal | Site: Knee | Laterality: Left

## 2024-04-27 MED ORDER — CHLORHEXIDINE GLUCONATE 0.12 % MT SOLN
15.0000 mL | Freq: Once | OROMUCOSAL | Status: AC
  Administered 2024-04-27: 15 mL via OROMUCOSAL

## 2024-04-27 MED ORDER — LACTATED RINGERS IV SOLN: INTRAVENOUS | Status: DC

## 2024-04-27 MED ORDER — PROPOFOL 1000 MG/100ML IV EMUL
INTRAVENOUS | Status: AC
  Filled 2024-04-27: qty 100

## 2024-04-27 MED ORDER — OXYCODONE-ACETAMINOPHEN 5-325 MG PO TABS: 1.0000 | ORAL_TABLET | ORAL | 0 refills | Status: AC | PRN

## 2024-04-27 MED ORDER — FENTANYL CITRATE (PF) 100 MCG/2ML IJ SOLN
INTRAMUSCULAR | Status: AC
  Filled 2024-04-27: qty 2

## 2024-04-27 MED ORDER — OXYCODONE HCL 5 MG PO TABS
5.0000 mg | ORAL_TABLET | ORAL | Status: DC | PRN
  Administered 2024-04-27: 10 mg via ORAL

## 2024-04-27 MED ORDER — ASPIRIN 325 MG PO TBEC: 325.0000 mg | DELAYED_RELEASE_TABLET | Freq: Two times a day (BID) | ORAL | 0 refills | Status: AC

## 2024-04-27 MED ORDER — MIDAZOLAM HCL 2 MG/2ML IJ SOLN
INTRAMUSCULAR | Status: AC
  Filled 2024-04-27: qty 2

## 2024-04-27 MED ORDER — EPHEDRINE SULFATE (PRESSORS) 25 MG/5ML IV SOSY
PREFILLED_SYRINGE | INTRAVENOUS | Status: DC | PRN
Start: 2024-04-27 — End: 2024-04-27
  Administered 2024-04-27 (×2): 10 mg via INTRAVENOUS
  Administered 2024-04-27: 5 mg via INTRAVENOUS

## 2024-04-27 MED ORDER — SODIUM CHLORIDE 0.9 % IR SOLN
Status: DC | PRN
  Administered 2024-04-27: 1000 mL

## 2024-04-27 MED ORDER — ROPIVACAINE HCL 5 MG/ML IJ SOLN
INTRAMUSCULAR | Status: DC | PRN
  Administered 2024-04-27: 30 mL via PERINEURAL

## 2024-04-27 MED ORDER — BUPIVACAINE IN DEXTROSE 0.75-8.25 % IT SOLN
INTRATHECAL | Status: DC | PRN
  Administered 2024-04-27: 1.6 mL via INTRATHECAL

## 2024-04-27 MED ORDER — PHENYLEPHRINE HCL-NACL 20-0.9 MG/250ML-% IV SOLN
INTRAVENOUS | Status: DC | PRN
  Administered 2024-04-27: 40 ug/min via INTRAVENOUS

## 2024-04-27 MED ORDER — VANCOMYCIN HCL IN DEXTROSE 1-5 GM/200ML-% IV SOLN
1000.0000 mg | INTRAVENOUS | Status: AC
  Administered 2024-04-27: 1000 mg via INTRAVENOUS
  Filled 2024-04-27: qty 200

## 2024-04-27 MED ORDER — TRANEXAMIC ACID-NACL 1000-0.7 MG/100ML-% IV SOLN
1000.0000 mg | INTRAVENOUS | Status: AC
  Administered 2024-04-27: 1000 mg via INTRAVENOUS
  Filled 2024-04-27: qty 100

## 2024-04-27 MED ORDER — OXYCODONE HCL 5 MG/5ML PO SOLN: 5.0000 mg | Freq: Once | ORAL | Status: DC | PRN

## 2024-04-27 MED ORDER — SODIUM CHLORIDE (PF) 0.9 % IJ SOLN
INTRAMUSCULAR | Status: AC
  Filled 2024-04-27: qty 50

## 2024-04-27 MED ORDER — DEXAMETHASONE SOD PHOSPHATE PF 10 MG/ML IJ SOLN
INTRAMUSCULAR | Status: DC | PRN
  Administered 2024-04-27: 8 mg via INTRAVENOUS

## 2024-04-27 MED ORDER — OXYCODONE HCL 5 MG PO TABS: 5.0000 mg | ORAL_TABLET | Freq: Once | ORAL | Status: DC | PRN

## 2024-04-27 MED ORDER — ORAL CARE MOUTH RINSE: 15.0000 mL | Freq: Once | OROMUCOSAL | Status: AC

## 2024-04-27 MED ORDER — FENTANYL CITRATE (PF) 100 MCG/2ML IJ SOLN
INTRAMUSCULAR | Status: DC | PRN
  Administered 2024-04-27 (×3): 25 ug via INTRAVENOUS
  Administered 2024-04-27: 100 ug via INTRAVENOUS
  Administered 2024-04-27: 25 ug via INTRAVENOUS

## 2024-04-27 MED ORDER — METHOCARBAMOL 500 MG PO TABS
500.0000 mg | ORAL_TABLET | Freq: Four times a day (QID) | ORAL | Status: DC | PRN
  Administered 2024-04-27: 500 mg via ORAL

## 2024-04-27 MED ORDER — ONDANSETRON HCL 4 MG/2ML IJ SOLN
INTRAMUSCULAR | Status: AC
Start: 2024-04-27 — End: 2024-04-27
  Filled 2024-04-27: qty 2

## 2024-04-27 MED ORDER — EPHEDRINE 5 MG/ML INJ
INTRAVENOUS | Status: AC
  Filled 2024-04-27: qty 5

## 2024-04-27 MED ORDER — INSULIN ASPART 100 UNIT/ML IJ SOLN: 0.0000 [IU] | INTRAMUSCULAR | Status: DC | PRN

## 2024-04-27 MED ORDER — LACTATED RINGERS IV BOLUS
250.0000 mL | Freq: Once | INTRAVENOUS | Status: AC
  Administered 2024-04-27: 250 mL via INTRAVENOUS

## 2024-04-27 MED ORDER — OXYCODONE HCL 5 MG PO TABS
ORAL_TABLET | ORAL | Status: AC
  Filled 2024-04-27: qty 2

## 2024-04-27 MED ORDER — WATER FOR IRRIGATION, STERILE IR SOLN
Status: DC | PRN
  Administered 2024-04-27: 2000 mL

## 2024-04-27 MED ORDER — BUPIVACAINE LIPOSOME 1.3 % IJ SUSP: 20.0000 mL | Freq: Once | INTRAMUSCULAR | Status: DC

## 2024-04-27 MED ORDER — MUPIROCIN 2 % EX OINT: 1.0000 | TOPICAL_OINTMENT | Freq: Two times a day (BID) | CUTANEOUS | 0 refills | Status: AC

## 2024-04-27 MED ORDER — PROPOFOL 10 MG/ML IV BOLUS
INTRAVENOUS | Status: DC | PRN
  Administered 2024-04-27: 30 mg via INTRAVENOUS
  Administered 2024-04-27: 40 mg via INTRAVENOUS
  Administered 2024-04-27: 30 mg via INTRAVENOUS

## 2024-04-27 MED ORDER — ONDANSETRON HCL 4 MG/2ML IJ SOLN: 4.0000 mg | Freq: Once | INTRAMUSCULAR | Status: DC | PRN

## 2024-04-27 MED ORDER — BUPIVACAINE LIPOSOME 1.3 % IJ SUSP
INTRAMUSCULAR | Status: AC
  Filled 2024-04-27: qty 20

## 2024-04-27 MED ORDER — TIZANIDINE HCL 2 MG PO TABS: 2.0000 mg | ORAL_TABLET | Freq: Four times a day (QID) | ORAL | 0 refills | Status: AC | PRN

## 2024-04-27 MED ORDER — ONDANSETRON HCL 4 MG/2ML IJ SOLN
INTRAMUSCULAR | Status: DC | PRN
  Administered 2024-04-27: 4 mg via INTRAVENOUS

## 2024-04-27 MED ORDER — LACTATED RINGERS IV BOLUS
500.0000 mL | Freq: Once | INTRAVENOUS | Status: AC
  Administered 2024-04-27: 500 mL via INTRAVENOUS

## 2024-04-27 MED ORDER — CEFAZOLIN SODIUM-DEXTROSE 2-4 GM/100ML-% IV SOLN
2.0000 g | INTRAVENOUS | Status: AC
  Administered 2024-04-27: 2 g via INTRAVENOUS
  Filled 2024-04-27: qty 100

## 2024-04-27 MED ORDER — METHOCARBAMOL 500 MG PO TABS
ORAL_TABLET | ORAL | Status: AC
  Filled 2024-04-27: qty 1

## 2024-04-27 MED ORDER — ONDANSETRON HCL 4 MG/2ML IJ SOLN
INTRAMUSCULAR | Status: AC
  Filled 2024-04-27: qty 2

## 2024-04-27 MED ORDER — BUPIVACAINE-EPINEPHRINE (PF) 0.5% -1:200000 IJ SOLN
INTRAMUSCULAR | Status: AC
  Filled 2024-04-27: qty 30

## 2024-04-27 MED ORDER — DROPERIDOL 2.5 MG/ML IJ SOLN: 0.6250 mg | Freq: Once | INTRAMUSCULAR | Status: DC | PRN

## 2024-04-27 MED ORDER — POVIDONE-IODINE 10 % EX SWAB
2.0000 | Freq: Once | CUTANEOUS | Status: AC
  Administered 2024-04-27: 2 via TOPICAL

## 2024-04-27 MED ORDER — DOCUSATE SODIUM 100 MG PO CAPS: 100.0000 mg | ORAL_CAPSULE | Freq: Two times a day (BID) | ORAL | 2 refills | Status: AC

## 2024-04-27 MED ORDER — CEFAZOLIN SODIUM-DEXTROSE 2-4 GM/100ML-% IV SOLN: 2.0000 g | Freq: Four times a day (QID) | INTRAVENOUS | Status: DC

## 2024-04-27 MED ORDER — CHLORHEXIDINE GLUCONATE 4 % EX SOLN: CUTANEOUS | 1 refills | Status: AC

## 2024-04-27 MED ORDER — TRANEXAMIC ACID-NACL 1000-0.7 MG/100ML-% IV SOLN: 1000.0000 mg | Freq: Once | INTRAVENOUS | Status: DC

## 2024-04-27 MED ORDER — MIDAZOLAM HCL (PF) 2 MG/2ML IJ SOLN
INTRAMUSCULAR | Status: DC | PRN
  Administered 2024-04-27: 2 mg via INTRAVENOUS

## 2024-04-27 MED ORDER — SODIUM CHLORIDE (PF) 0.9 % IJ SOLN
INTRAMUSCULAR | Status: DC | PRN
  Administered 2024-04-27: 100 mL

## 2024-04-27 MED ORDER — PROPOFOL 500 MG/50ML IV EMUL
INTRAVENOUS | Status: DC | PRN
  Administered 2024-04-27: 35 ug/kg/min via INTRAVENOUS

## 2024-04-27 MED ORDER — 0.9 % SODIUM CHLORIDE (POUR BTL) OPTIME
TOPICAL | Status: DC | PRN
  Administered 2024-04-27: 1000 mL

## 2024-04-27 MED ORDER — METHOCARBAMOL 1000 MG/10ML IJ SOLN: 500.0000 mg | Freq: Four times a day (QID) | INTRAMUSCULAR | Status: DC | PRN

## 2024-04-27 MED ORDER — HYDROMORPHONE HCL 1 MG/ML IJ SOLN: 0.2500 mg | INTRAMUSCULAR | Status: DC | PRN

## 2024-04-27 SURGICAL SUPPLY — 49 items
ATTUNE MED DOME PAT 32 KNEE (Knees) IMPLANT
ATTUNE PSFEM LTSZ3 NARCEM KNEE (Femur) IMPLANT
ATTUNE PSRP INSR SZ3 5 KNEE (Insert) IMPLANT
BAG COUNTER SPONGE SURGICOUNT (BAG) IMPLANT
BAG ZIPLOCK 12X15 (MISCELLANEOUS) ×1 IMPLANT
BASE TIBIAL ROT PLAT SZ 2 KNEE (Miscellaneous) IMPLANT
BLADE SAGITTAL 25.0X1.19X90 (BLADE) ×1 IMPLANT
BLADE SAW SGTL 13.0X1.19X90.0M (BLADE) ×1 IMPLANT
BNDG ELASTIC 6INX 5YD STR LF (GAUZE/BANDAGES/DRESSINGS) ×1 IMPLANT
BNDG ELASTIC 6X10 VLCR STRL LF (GAUZE/BANDAGES/DRESSINGS) IMPLANT
BOOTIES KNEE HIGH SLOAN (MISCELLANEOUS) ×1 IMPLANT
BOWL SMART MIX CTS (DISPOSABLE) ×1 IMPLANT
CEMENT HV SMART SET (Cement) ×2 IMPLANT
COVER SURGICAL LIGHT HANDLE (MISCELLANEOUS) ×1 IMPLANT
CUFF TRNQT CYL 34X4.125X (TOURNIQUET CUFF) ×1 IMPLANT
DERMABOND ADVANCED .7 DNX12 (GAUZE/BANDAGES/DRESSINGS) ×1 IMPLANT
DRAPE U-SHAPE 47X51 STRL (DRAPES) ×1 IMPLANT
DRSG AQUACEL AG ADV 3.5X 4 (GAUZE/BANDAGES/DRESSINGS) IMPLANT
DRSG AQUACEL AG ADV 3.5X10 (GAUZE/BANDAGES/DRESSINGS) ×1 IMPLANT
DURAPREP 26ML APPLICATOR (WOUND CARE) ×1 IMPLANT
ELECT PENCIL ROCKER SW 15FT (MISCELLANEOUS) ×1 IMPLANT
ELECT REM PT RETURN 15FT ADLT (MISCELLANEOUS) ×1 IMPLANT
GLOVE BIOGEL PI IND STRL 8 (GLOVE) ×2 IMPLANT
GLOVE BIOGEL PI IND STRL 9 (GLOVE) IMPLANT
GLOVE ECLIPSE 7.5 STRL STRAW (GLOVE) ×2 IMPLANT
GLOVE ECLIPSE 8.5 STRL (GLOVE) IMPLANT
GOWN STRL REUS W/ TWL XL LVL3 (GOWN DISPOSABLE) ×2 IMPLANT
HOOD PEEL AWAY T7 (MISCELLANEOUS) ×3 IMPLANT
KIT TURNOVER KIT A (KITS) ×1 IMPLANT
MANIFOLD NEPTUNE II (INSTRUMENTS) ×1 IMPLANT
NDL HYPO 22X1.5 SAFETY MO (MISCELLANEOUS) ×2 IMPLANT
NEEDLE HYPO 22X1.5 SAFETY MO (MISCELLANEOUS) ×2 IMPLANT
NS IRRIG 1000ML POUR BTL (IV SOLUTION) ×1 IMPLANT
PACK TOTAL KNEE CUSTOM (KITS) ×1 IMPLANT
PADDING CAST COTTON 6X4 STRL (CAST SUPPLIES) ×1 IMPLANT
PIN STEINMAN FIXATION KNEE (PIN) IMPLANT
PROTECTOR NERVE ULNAR (MISCELLANEOUS) ×1 IMPLANT
SET HNDPC FAN SPRY TIP SCT (DISPOSABLE) ×1 IMPLANT
SPIKE FLUID TRANSFER (MISCELLANEOUS) ×2 IMPLANT
SUT MNCRL AB 3-0 PS2 18 (SUTURE) ×1 IMPLANT
SUT VIC AB 0 CT1 36 (SUTURE) ×2 IMPLANT
SUT VIC AB 1 CT1 36 (SUTURE) ×2 IMPLANT
SUT VIC AB 2-0 CT1 TAPERPNT 27 (SUTURE) ×1 IMPLANT
SUT VICRYL+ 3-0 36IN CT-1 (SUTURE) IMPLANT
SYR CONTROL 10ML LL (SYRINGE) ×2 IMPLANT
TRAY CATH INTERMITTENT SS 16FR (CATHETERS) IMPLANT
TUBE SUCTION HIGH CAP CLEAR NV (SUCTIONS) ×1 IMPLANT
WATER STERILE IRR 1000ML POUR (IV SOLUTION) ×2 IMPLANT
WRAP KNEE MAXI GEL POST OP (GAUZE/BANDAGES/DRESSINGS) ×1 IMPLANT

## 2024-04-27 NOTE — Transfer of Care (Signed)
 Immediate Anesthesia Transfer of Care Note  Patient: [name]  Procedure(s) Performed: ARTHROPLASTY, KNEE, TOTAL (Left: Knee)  Patient Location: PACU  Anesthesia Type:Spinal  Level of Consciousness: awake and patient cooperative  Airway & Oxygen Therapy: Patient Spontanous Breathing and Patient connected to face mask oxygen  Post-op Assessment: Report given to RN and Post -op Vital signs reviewed and stable  Post vital signs: Reviewed and stable  Last Vitals:  Vitals Value Taken Time  BP 110/71 04/27/24 09:38  Temp    Pulse 84 04/27/24 09:34  Resp 18 04/27/24 09:34  SpO2 96 % 04/27/24 09:34  Vitals shown include unfiled device data.  Last Pain:  Vitals:   04/27/24 0555  TempSrc:   PainSc: 0-No pain         Complications: No notable events documented.

## 2024-04-27 NOTE — Op Note (Signed)
 PATIENT ID:      [name]  MRN:     [id number] DOB/AGE:    1960/12/01 / 63 y.o.       OPERATIVE REPORT   DATE OF PROCEDURE:  04/27/2024      PREOPERATIVE DIAGNOSIS:   LEFT KNEE DEGENERATIVE JOINT DISEASE      Estimated body mass index is 33.03 kg/m as calculated from the following:   Height as of 04/21/24: 5' 1 (1.549 m).   Weight as of this encounter: 79.3 kg.                                                       POSTOPERATIVE DIAGNOSIS:   Same                                                                  PROCEDURE:  Procedure(s): ARTHROPLASTY, KNEE, TOTAL Using DepuyAttune RP implants #3N Femur, #2Tibia, 5 mm Attune RP bearing, 32 Patella    SURGEON: [name]  ASSISTANT:   Camellia POUR. [employer]   (Present and scrubbed throughout the case, critical for assistance with exposure, retraction, instrumentation, and closure.)        ANESTHESIA: spinal, 20cc Exparel , 50cc 0.25% Marcaine  EBL: min cc FLUID REPLACEMENT: unk cc crystaloid TOURNIQUET: none DRAINS: None TRANEXAMIC ACID : 1gm IV, 2gm topical COMPLICATIONS:  None         INDICATIONS FOR PROCEDURE: The patient has  LEFT KNEE DEGENERATIVE JOINT DISEASE, varus deformities, XR shows bone on bone arthritis, lateral subluxation of tibia. Patient has failed all conservative measures including anti-inflammatory medicines, narcotics, attempts at exercise and weight loss, cortisone injections and viscosupplementation.  Risks and benefits of surgery have been discussed, questions answered.   DESCRIPTION OF PROCEDURE: The patient identified by armband, received  IV antibiotics, in the holding area at [facility]. Patient taken to the operating room, appropriate anesthetic monitors were attached, and spinal anesthesia was  induced. IV Tranexamic acid  was given. Lateral post and 2 surefoot positioners applied to the table, the lower extremity was then prepped and draped in usual sterile fashion from the toes to the  high thigh. Time-out procedure was performed. Camellia POUR. [facility] PAC, was present and scrubbed throughout the case, critical for assistance with, positioning, exposure, retraction, instrumentation, and closure.The skin and subcutaneous tissue along the incision was injected with 20 cc of a mixture of 20cc Exparel  and 30cc Marcaine  50cc saline solution, using a 21-gauge by 1-1/2 inch needle. We began the operation, with the knee flexed 130 degrees, by making the anterior midline incision starting at handbreadth above the patella going over the patella 1 cm medial to and 4 cm distal to the tibial tubercle. Small bleeders in the skin and the subcutaneous tissue identified and cauterized. Transverse retinaculum was incised and reflected medially and a medial parapatellar arthrotomy was accomplished. the patella was everted and theprepatellar fat pad resected. The superficial medial collateral ligament was then elevated from anterior to posterior along the proximal flare of the tibia and anterior half of the menisci resected. The knee was hyperflexed exposing bone  on bone arthritis. Peripheral and notch osteophytes as well as the cruciate ligaments were then resected. We continued to work our way around posteriorly along the proximal tibia, and externally rotated the tibia subluxing it out from underneath the femur. A McHale PCL retractor was placed through the notch, a lateral Hohmann retractor, and anterolateral small homan retractor placed. We then entered the proximal tibia with the Depuy starter drill in line with the axis of the tibia followed by an intramedullary guide rod and 3 degree posterior slope cutting guide. The tibial cutting guide, was pinned into place allowing resection of 2 mm of bone medially and 10 mm of bone laterally. Satisfied with the tibial resection, we then entered the distal femur 2 mm anterior to the PCL origin with the starter drill, followed by the intramedullary guide rod and applied the  distal femoral cutting guide set at 9 mm, with 5 degrees of valgus. This was pinned along the epicondylar axis. At this point, the distal femoral cut was accomplished without difficulty. We then sized for a #3N femoral component and pinned the chamfer guide in 3 degrees of external rotation. The anterior, posterior, and chamfer cuts were accomplished without difficulty followed by the Attune RP box cutting guide and the box cut. We also removed posterior osteophytes from the posterior femoral condyles. The posterior capsule was injected with Exparel  solution. The knee was brought into full extension. We checked our extension gap and fit a 5 mm trial lollipop. Distracting in extension with a lamina spreader,  bleeders in the posterior capsule, Posterior medial and posterior lateral gutter were cauterized.  The transexamic acid-soaked sponge was then placed in the gap of the knee in extension. The knee was flexed 30. The posterior patella cut was accomplished with the 9.5 mm Attune cutting guide, sized for a 32 mm dome, and the fixation pegs drilled.The knee was then once again hyperflexed exposing the proximal tibia. We sized for a # 2 tibial base plate, applied the smokestack and the conical reamer followed by the the Delta fin keel punch. We then hammered into place the Attune RP trial femoral component, drilled the lugs, inserted a  5 mm trial bearing, trial patellar button, and took the knee through range of motion from 0-130 degrees. Medial and lateral ligamentous stability was checked. No thumb pressure was required for patellar Tracking.  All trial components were removed, mating surfaces irrigated with pulse lavage, and dried with suction and sponges. Exparel  solution was applied to the cancellus bone of the patella distal femur and proximal tibia.  After waiting 30 seconds, the bony surfaces were again, dried with sponges. A double batch of DePuy HV cement was mixed and applied to all bony metallic mating  surfaces except for the posterior condyles of the femur itself. In order, we hammered into place the tibial tray and removed excess cement, the femoral component and removed excess cement. The final Attune RP bearing was inserted, and the knee brought to full extension with compression. The patellar button was clamped into place, and excess cement removed. The knee was held at 30 flexion with compression using the second surefoot, while the cement cured. The wound was irrigated out with normal saline solution pulse lavage. The rest of the Exparel  was injected into the parapatellar arthrotomy, subcutaneous tissues, and periosteal tissues. The parapatellar arthrotomy was closed with running #1 Vicryl suture. The subcutaneous tissue with 3-0 undyed Vicryl suture, and the skin with running 3-0 SQ vicryl. An Aquacil dressing and Ace  wrap were applied. The patient was taken to recovery room without difficulty.   [name] 04/27/2024, 10:13 AM

## 2024-04-27 NOTE — Anesthesia Procedure Notes (Signed)
 Spinal  Patient location during procedure: OR Start time: 04/27/2024 7:27 AM End time: 04/27/2024 7:35 AM Reason for block: surgical anesthesia Staffing Performed: anesthesiologist  Anesthesiologist: Jerrye Sharper, MD Performed by: Jerrye Sharper, MD Authorized by: Jerrye Sharper, MD   Preanesthetic Checklist Completed: patient identified, IV checked, site marked, risks and benefits discussed, surgical consent, monitors and equipment checked, pre-op evaluation and timeout performed Spinal Block Patient position: sitting Prep: DuraPrep and site prepped and draped Patient monitoring: heart rate, cardiac monitor, continuous pulse ox and blood pressure Approach: midline Location: L3-4 Injection technique: single-shot Needle Needle type: Pencan  Needle gauge: 24 G Needle length: 9 cm Needle insertion depth: 7 cm Assessment Sensory level: T6 Events: CSF return Additional Notes Patient tolerated procedure well. Adequate sensory level.

## 2024-04-27 NOTE — Evaluation (Signed)
 Physical Therapy Evaluation Patient Details Name: [name] MRN: [id number] DOB: February 24, 1961 Today's Date: 04/27/2024  History of Present Illness  63 yo female presents to therapy s/p L TKA on 04/27/2024 due to failure of conservative measures. Pt PMH includes but is not limited to: DM II, OA, MDD, HLD, liver dz, substance abuse, hypothyroidism and R TKA (2019).  Clinical Impression    [name] is a 63 y.o. female POD 0 s/p L TKA. Patient reports IND with mobility at baseline. Patient is now limited by functional impairments (see PT problem list below) and requires CGA and cues for transfers and gait with RW. Patient was able to ambulate 55 x 3  feet with RW and CGA and cues for safe walker management. Patient educated on safe sequencing for stair mobility with R handrail only, fall risk prevention, pain management and goal, use of CP/ice pt and significant other verbalized understanding of safe guarding position for people assisting with mobility. Patient instructed in exercises to facilitate ROM and circulation reviewed and HO provided. Patient will benefit from continued skilled PT interventions to address impairments and progress towards PLOF. Patient has met mobility goals at adequate level for discharge home with family support and ongoing therapy services; will continue to follow if pt continues acute stay to progress towards Mod I goals.       If plan is discharge home, recommend the following: A little help with walking and/or transfers;A little help with bathing/dressing/bathroom;Assistance with cooking/housework;Assist for transportation;Help with stairs or ramp for entrance   Can travel by private vehicle        Equipment Recommendations None recommended by PT  Recommendations for Other Services       Functional Status Assessment Patient has had a recent decline in their functional status and demonstrates the ability to make significant improvements in function in a  reasonable and predictable amount of time.     Precautions / Restrictions Precautions Precautions: Knee;Fall Restrictions Weight Bearing Restrictions Per Provider Order: No      Mobility  Bed Mobility Overal bed mobility: Needs Assistance Bed Mobility: Supine to Sit     Supine to sit: Supervision     General bed mobility comments: min cues, HOB elevated    Transfers Overall transfer level: Needs assistance Equipment used: Rolling walker (2 wheels) Transfers: Sit to/from Stand Sit to Stand: Contact guard assist           General transfer comment: min cues    Ambulation/Gait Ambulation/Gait assistance: Contact guard assist Gait Distance (Feet): 50 Feet Assistive device: Rolling walker (2 wheels) Gait Pattern/deviations: Step-to pattern, Decreased stance time - left, Antalgic, Trunk flexed Gait velocity: decreased     General Gait Details: min cues for safety and sequencing with step to pattern and pt L knee initally soft and improved throughout Eval with stabiltiy and motor control and coordination, slight trunk flexion with B UE support at RW to offload L LE in stance phase.  Stairs Stairs: Yes Stairs assistance: Contact guard assist Stair Management: One rail Right, Two rails Number of Stairs: 2 General stair comments: step navigaiton instruction initiated with B handrails and pt able to progress to B UE support on R handrail with min cues for safety, sequencing and technique  Wheelchair Mobility     Tilt Bed    Modified Rankin (Stroke Patients Only)       Balance Overall balance assessment: Needs assistance Sitting-balance support: Feet supported Sitting balance-Leahy Scale: Good     Standing  balance support: Bilateral upper extremity supported, During functional activity, Reliant on assistive device for balance Standing balance-Leahy Scale: Fair Standing balance comment: static standing no UE support                              Pertinent Vitals/Pain Pain Assessment Pain Assessment: 0-10 Pain Score: 6  Pain Location: L knee and LE Pain Descriptors / Indicators: Aching, Constant, Discomfort, Dull, Grimacing, Operative site guarding Pain Intervention(s): Limited activity within patient's tolerance, Monitored during session, Premedicated before session, Repositioned, Ice applied    Home Living Family/patient expects to be discharged to:: Private residence Living Arrangements: Children;Spouse/significant other Available Help at Discharge: Family Type of Home: Apartment Home Access: Stairs to enter Entrance Stairs-Rails: Right Entrance Stairs-Number of Steps: 5   Home Layout: One level Home Equipment: [employer]ant (2 wheels);Cane - single point      Prior Function Prior Level of Function : Independent/Modified Independent             Mobility Comments: IND no AD for all ADLs, self care tasks and IADLs       Extremity/Trunk Assessment        Lower Extremity Assessment Lower Extremity Assessment: LLE deficits/detail LLE Deficits / Details: ankle DF/PF 5/5; SLR > 10 degree lag or with AA LLE Sensation: decreased light touch (once in standing)    Cervical / Trunk Assessment Cervical / Trunk Assessment: Normal  Communication   Communication Communication: No apparent difficulties    Cognition Arousal: Alert Behavior During Therapy: WFL for tasks assessed/performed   PT - Cognitive impairments: No apparent impairments                         Following commands: Intact       Cueing       General Comments      Exercises Total Joint Exercises Ankle Circles/Pumps: PROM, Both, 10 reps Quad Sets: AROM, Left, 5 reps Short Arc Quad: Right, AAROM, 5 reps Heel Slides: AROM, Left, 5 reps Hip ABduction/ADduction: AROM, 5 reps, Left Straight Leg Raises: AAROM, Left, 5 reps Knee Flexion: AROM, Left, 5 reps, Seated   Assessment/Plan    PT Assessment Patient needs  continued PT services  PT Problem List Decreased strength;Decreased range of motion;Decreased activity tolerance;Decreased balance;Decreased mobility;Decreased coordination;Pain       PT Treatment Interventions DME instruction;Gait training;Stair training;Functional mobility training;Therapeutic activities;Therapeutic exercise;Balance training;Neuromuscular re-education;Patient/family education;Modalities    PT Goals (Current goals can be found in the Care Plan section)  Acute Rehab PT Goals Patient Stated Goal: to be able to exercise and walk no pian PT Goal Formulation: With patient Time For Goal Achievement: 05/11/24 Potential to Achieve Goals: Good    Frequency 7X/week     Co-evaluation               AM-PAC PT 6 Clicks Mobility  Outcome Measure Help needed turning from your back to your side while in a flat bed without using bedrails?: None Help needed moving from lying on your back to sitting on the side of a flat bed without using bedrails?: None Help needed moving to and from a bed to a chair (including a wheelchair)?: A Little Help needed standing up from a chair using your arms (e.g., wheelchair or bedside chair)?: A Little Help needed to walk in hospital room?: A Little Help needed climbing 3-5 steps with a railing? : A Little 6 Click  Score: 20    End of Session Equipment Utilized During Treatment: Gait belt Activity Tolerance: Patient tolerated treatment well;No increased pain Patient left: in chair;with call bell/phone within reach;with family/visitor present Nurse Communication: Mobility status;Other (comment) (pt readiness for same day d/c from PT standpoint) PT Visit Diagnosis: Other abnormalities of gait and mobility (R26.89);Unsteadiness on feet (R26.81);Muscle weakness (generalized) (M62.81);Difficulty in walking, not elsewhere classified (R26.2);Pain Pain - Right/Left: Left Pain - part of body: Knee;Leg    Time: 8750-8671 PT Time Calculation (min)  (ACUTE ONLY): 39 min   Charges:   PT Evaluation $PT Eval Low Complexity: 1 Low PT Treatments $Gait Training: 8-22 mins $Therapeutic Exercise: 8-22 mins PT General Charges $$ ACUTE PT VISIT: 1 Visit         [place], PT Acute Rehab   [place] VEAR Drone 04/27/2024, 1:56 PM

## 2024-04-27 NOTE — Anesthesia Procedure Notes (Signed)
 Anesthesia Regional Block: Adductor canal block   Pre-Anesthetic Checklist: , timeout performed,  Correct Patient, Correct Site, Correct Laterality,  Correct Procedure, Correct Position, site marked,  Risks and benefits discussed,  Surgical consent,  Pre-op evaluation,  At surgeon's request and post-op pain management  Laterality: Left  Prep: chloraprep       Needles:  Injection technique: Single-shot  Needle Type: Echogenic Stimulator Needle     Needle Length: 10cm  Needle Gauge: 21   Needle insertion depth: 7 cm   Additional Needles:   Procedures:,,,, ultrasound used (permanent image in chart),,    Narrative:  Start time: 04/27/2024 7:11 AM End time: 04/27/2024 7:16 AM Injection made incrementally with aspirations every 5 mL.  Performed by: Personally  Anesthesiologist: Jerrye Sharper, MD  Additional Notes: Timeout performed. Patient sedated. Relevant anatomy ID'd using US . Incremental 2-5ml injection of LA with frequent aspiration. Patient tolerated procedure well.     Left Adductor Canal Block

## 2024-04-27 NOTE — Discharge Instructions (Signed)

## 2024-04-27 NOTE — Interval H&P Note (Signed)
 History and Physical Interval Note:  04/27/2024 7:18 AM  [name]  has presented today for surgery, with the diagnosis of LEFT KNEE DEGENERATIVE JOINT DISEASE.  The various methods of treatment have been discussed with the patient and family. After consideration of risks, benefits and other options for treatment, the patient has consented to  Procedure(s): ARTHROPLASTY, KNEE, TOTAL (Left) as a surgical intervention.  The patient's history has been reviewed, patient examined, no change in status, stable for surgery.  I have reviewed the patient's chart and labs.  Questions were answered to the patient's satisfaction.     [name]

## 2024-04-27 NOTE — Anesthesia Postprocedure Evaluation (Signed)
 Anesthesia Post Note  Patient: [name]  Procedure(s) Performed: ARTHROPLASTY, KNEE, TOTAL (Left: Knee)     Patient location during evaluation: PACU Anesthesia Type: Spinal Level of consciousness: oriented and awake and alert Pain management: pain level controlled Vital Signs Assessment: post-procedure vital signs reviewed and stable Respiratory status: spontaneous breathing, respiratory function stable and patient connected to nasal cannula oxygen Cardiovascular status: blood pressure returned to baseline and stable Postop Assessment: no headache, no backache, no apparent nausea or vomiting, spinal receding and patient able to bend at knees Anesthetic complications: no   No notable events documented.  Last Vitals:  Vitals:   04/27/24 1115 04/27/24 1130  BP: 130/74 134/68  Pulse: 63 (!) 58  Resp: 16 15  Temp:    SpO2: 93% 94%    Last Pain:  Vitals:   04/27/24 1130  TempSrc:   PainSc: (P) Asleep                 Salvatrice Morandi A.

## 2024-04-27 NOTE — Progress Notes (Signed)
 Orthopedic Tech Progress Note Patient Details:  JEANETT ANTONOPOULOS 1961-04-15 [id number] Applied bone foam per order.  Ortho Devices Type of Ortho Device: Bone foam zero knee Ortho Device/Splint Location: LLE Ortho Device/Splint Interventions: Ordered, Application, Adjustment   Post Interventions Patient Tolerated: Well Instructions Provided: Adjustment of device, Care of device, Poper ambulation with device  Morna Pink 04/27/2024, 10:05 AM

## 2024-04-28 ENCOUNTER — Encounter (HOSPITAL_COMMUNITY): Payer: Self-pay | Admitting: Orthopedic Surgery

## 2024-05-15 ENCOUNTER — Ambulatory Visit: Payer: MEDICAID

## 2024-05-15 NOTE — Therapy (Incomplete)
 OUTPATIENT PHYSICAL THERAPY LOWER EXTREMITY EVALUATION   Patient Name: [name] MRN: [id number] DOB:1960/11/04, 63 y.o., female Today's Date: 05/15/2024  END OF SESSION:   Past Medical History:  Diagnosis Date   Arthritis    Cocaine abuse (HCC)    Depression    Diabetes mellitus without complication (HCC)    ETOH abuse    Hypothyroidism    Unspecified mood (affective) disorder    Past Surgical History:  Procedure Laterality Date   radioactive iodine ablation     graves disease s/p   TOTAL KNEE ARTHROPLASTY Right 05/09/2018   Procedure: TOTAL KNEE ARTHROPLASTY;  Surgeon: Yvone Rush, MD;  Location: [facility];  Service: Orthopedics;  Laterality: Right;   TOTAL KNEE ARTHROPLASTY Left 04/27/2024   Procedure: ARTHROPLASTY, KNEE, TOTAL;  Surgeon: Yvone Rush, MD;  Location: [facility];  Service: Orthopedics;  Laterality: Left;   Patient Active Problem List   Diagnosis Date Noted   Preoperative examination 03/13/2024   Onychomycosis 11/28/2022   Controlled type 2 diabetes mellitus with complication, without long-term current use of insulin (HCC) 08/10/2020   Bilateral primary osteoarthritis of knee 05/09/2018   Degenerative arthritis of left knee 09/12/2017   MDD (major depressive disorder), recurrent severe, without psychosis (HCC) 11/03/2016   Poor dentition 06/26/2016   Hyperlipidemia 06/26/2016   Chronic liver disease 08/29/2015   Health care maintenance 12/31/2013   Alcohol use disorder, moderate, dependence (HCC)    Primary vulvar squamous cell carcinoma s/p resection 2011 02/13/2010   Postablative hypothyroidism 04/12/2006   TOBACCO ABUSE 04/12/2006    PCP: Kandis Perkins, DO  REFERRING PROVIDER: Yvone Rush, MD  REFERRING DIAG: M17.12 (ICD-10-CM) - Unilateral primary osteoarthritis, left knee   THERAPY DIAG:  No diagnosis found.  Rationale for Evaluation and Treatment: Rehabilitation  ONSET DATE: 04/27/2024  SUBJECTIVE:   SUBJECTIVE  STATEMENT: ***  PERTINENT HISTORY: ***  PAIN:  Are you having pain?  Yes: NPRS scale: *** Pain location: *** Pain description: *** Aggravating factors: *** Relieving factors: ***  PRECAUTIONS: {Therapy precautions:24002}  RED FLAGS: {PT Red Flags:29287}   WEIGHT BEARING RESTRICTIONS: {Yes ***/No:24003}  FALLS:  Has patient fallen in last 6 months? {fallsyesno:27318}  LIVING ENVIRONMENT: Lives with: lives with their family Lives in: House/apartment Stairs: Yes: External: 5 steps; on right going up Has following equipment at home: Single point cane and Walker - 2 wheeled  OCCUPATION: ***  PLOF: {PLOF:24004}  PATIENT GOALS: ***  NEXT MD VISIT: ***  OBJECTIVE:  Note: Objective measures were completed at Evaluation unless otherwise noted.  DIAGNOSTIC FINDINGS: ***  PATIENT SURVEYS:  LEFS  Extreme difficulty/unable (0), Quite a bit of difficulty (1), Moderate difficulty (2), Little difficulty (3), No difficulty (4) Survey date:  05/15/24  Any of your usual work, housework or school activities   2. Usual hobbies, recreational or sporting activities   3. Getting into/out of the bath   4. Walking between rooms   5. Putting on socks/shoes   6. Squatting    7. Lifting an object, like a bag of groceries from the floor   8. Performing light activities around your home   9. Performing heavy activities around your home   10. Getting into/out of a car   11. Walking 2 blocks   12. Walking 1 mile   13. Going up/down 10 stairs (1 flight)   14. Standing for 1 hour   15.  sitting for 1 hour   16. Running on even ground   17. Running on uneven ground  18. Making sharp turns while running fast   19. Hopping    20. Rolling over in bed   Score total:  ***     COGNITION: Overall cognitive status: {cognition:24006}     SENSATION: {sensation:27233}  EDEMA:  {edema:24020}  MUSCLE LENGTH: Hamstrings: Right *** deg; Left *** deg Debby test: Right *** deg; Left ***  deg  POSTURE: {posture:25561}  PALPATION: ***  LOWER EXTREMITY ROM:  Active ROM Right eval Left eval  Knee flexion *** ***  Knee extension *** ***   (Blank rows = not tested)  LOWER EXTREMITY MMT:  MMT Right eval Left eval  Hip flexion    Hip extension    Hip abduction    Hip adduction    Hip internal rotation    Hip external rotation    Knee flexion    Knee extension    Ankle dorsiflexion    Ankle plantarflexion    Ankle inversion    Ankle eversion     (Blank rows = not tested)  LOWER EXTREMITY SPECIAL TESTS:  {LEspecialtests:26242}  FUNCTIONAL TESTS:  30 Second Sit to Stand: *** reps TUG: ***  GAIT: Distance walked: *** Assistive device utilized: {Assistive devices:23999} Level of assistance: {Levels of assistance:24026} Comments: ***   TREATMENT: [facility] Adult PT Treatment:                                                DATE: *** Therapeutic Exercise: *** Manual Therapy: *** Neuromuscular re-ed: *** Therapeutic Activity: *** Modalities: *** Self Care: ***  PATIENT EDUCATION:  Education details: eval findings, LEFS, HEP, POC Person educated: Patient Education method: Explanation, Demonstration, and Handouts Education comprehension: verbalized understanding and returned demonstration  HOME EXERCISE PROGRAM: ***  ASSESSMENT:  CLINICAL IMPRESSION: Patient is a 63 y.o. F who was seen today for physical therapy evaluation and treatment s/p L TKA performed by Dr. Yvone on 05/15/2024. Physical findings are consistent with surgery and recovery timeline as pt demonstrates decrease in L knee ROM and general functional mobility. LEFS shows severe decrease in subjective functional ability well below PLOF. Pt would benefit from skilled PT services working on improving L knee strength, ROM, and gait post op.   OBJECTIVE IMPAIRMENTS: {opptimpairments:25111}.   ACTIVITY LIMITATIONS: {activitylimitations:27494}  PARTICIPATION LIMITATIONS:  {participationrestrictions:25113}  PERSONAL FACTORS: {Personal factors:25162} are also affecting patient's functional outcome.   REHAB POTENTIAL: {rehabpotential:25112}  CLINICAL DECISION MAKING: {clinical decision making:25114}  EVALUATION COMPLEXITY: {Evaluation complexity:25115}   GOALS: Goals reviewed with patient? {yes/no:20286}  SHORT TERM GOALS: Target date: *** *** Baseline: Goal status: INITIAL  2.  *** Baseline:  Goal status: INITIAL  LONG TERM GOALS: Target date: 08/07/2024   Pt will improve LEFS to no less than ***/80 as proxy for functional improvement with home ADLs and higher level community activity Baseline: ***/80 Goal status: {GOALSTATUS:25110}   2.  Pt will self report *** pain no greater than ***/10 for improved comfort and functional ability Baseline: ***/10 at worst Goal status: {GOALSTATUS:25110}   3.  Pt will increase 30 Second Sit to Stand rep count to no less than *** reps for improved balance, strength, and functional mobility Baseline: *** reps  Goal status: {GOALSTATUS:25110}   4.  *** Baseline:  Goal status: INITIAL  5.  *** Baseline:  Goal status: INITIAL  6.  *** Baseline:  Goal status: INITIAL   PLAN:  PT FREQUENCY: {rehab frequency:25116}  PT DURATION: {rehab duration:25117}  PLANNED INTERVENTIONS: {rehab planned interventions:25118::97110-Therapeutic exercises,97530- Therapeutic [phone]- Neuromuscular re-education,97535- Self Rjmz,02859- Manual therapy,Patient/Family education}  PLAN FOR NEXT SESSION: PIERRETTE Alm JAYSON Johna, PT 05/15/2024, 7:53 AM

## 2024-05-21 ENCOUNTER — Other Ambulatory Visit: Payer: Self-pay

## 2024-05-21 ENCOUNTER — Ambulatory Visit: Payer: MEDICAID | Attending: Orthopedic Surgery

## 2024-05-21 DIAGNOSIS — Z96652 Presence of left artificial knee joint: Secondary | ICD-10-CM | POA: Insufficient documentation

## 2024-05-21 NOTE — Therapy (Signed)
 OUTPATIENT PHYSICAL THERAPY LOWER EXTREMITY EVALUATION   Patient Name: [name] MRN: [id number] DOB:Apr 16, 1961, 63 y.o., female Today's Date: 05/21/2024  END OF SESSION:  PT End of Session - 05/21/24 0952     Visit Number 1    Number of Visits 12    Date for Recertification  07/02/24    Authorization Type [employer]    Progress Note Due on Visit 10    PT Start Time 0957    PT Stop Time 1035    PT Time Calculation (min) 38 min    Activity Tolerance Patient limited by pain    Behavior During Therapy WFL for tasks assessed/performed          Past Medical History:  Diagnosis Date   Arthritis    Cocaine abuse (HCC)    Depression    Diabetes mellitus without complication (HCC)    ETOH abuse    Hypothyroidism    Unspecified mood (affective) disorder    Past Surgical History:  Procedure Laterality Date   radioactive iodine ablation     graves disease s/p   TOTAL KNEE ARTHROPLASTY Right 05/09/2018   Procedure: TOTAL KNEE ARTHROPLASTY;  Surgeon: Yvone Rush, MD;  Location: [facility];  Service: Orthopedics;  Laterality: Right;   TOTAL KNEE ARTHROPLASTY Left 04/27/2024   Procedure: ARTHROPLASTY, KNEE, TOTAL;  Surgeon: Yvone Rush, MD;  Location: [facility];  Service: Orthopedics;  Laterality: Left;   Patient Active Problem List   Diagnosis Date Noted   Preoperative examination 03/13/2024   Onychomycosis 11/28/2022   Controlled type 2 diabetes mellitus with complication, without long-term current use of insulin (HCC) 08/10/2020   Bilateral primary osteoarthritis of knee 05/09/2018   Degenerative arthritis of left knee 09/12/2017   MDD (major depressive disorder), recurrent severe, without psychosis (HCC) 11/03/2016   Poor dentition 06/26/2016   Hyperlipidemia 06/26/2016   Chronic liver disease 08/29/2015   Health care maintenance 12/31/2013   Alcohol use disorder, moderate, dependence (HCC)    Primary vulvar squamous cell carcinoma s/p resection 2011 02/13/2010    Postablative hypothyroidism 04/12/2006   TOBACCO ABUSE 04/12/2006    PCP: Kandis Perkins, DO  REFERRING PROVIDER: Yvone Rush HERO  REFERRING DIAG: M17.12 (ICD-10-CM) - Unilateral primary osteoarthritis, left knee   THERAPY DIAG:  Status post total knee replacement, left  Rationale for Evaluation and Treatment: Rehabilitation  ONSET DATE: 04/27/2024  SUBJECTIVE:   SUBJECTIVE STATEMENT: Pt reports a L TKA on 04/27/2024. Prior to surgery, she was using an AD or was hopping around. Since the surgery, [name] has been using a walker. She reports pain with most movement and has been performing exercises at home (ankle pumps). She saw her surgeon about 2 weeks ago who advised return in 1 month/4 weeks. Pt has not noticed any drainage from her incision. She denies fever and tenderness around calf.   PERTINENT HISTORY: Drug abuse, DM II, liver diease, MDD PAIN:  Are you having pain? Yes: NPRS scale: 7/10; 7/10 worst; 3/10 best Pain location: over incision Pain description: ache Aggravating factors: movement Relieving factors: ice  PRECAUTIONS: Knee  RED FLAGS: None   WEIGHT BEARING RESTRICTIONS: No  FALLS:  Has patient fallen in last 6 months? No  LIVING ENVIRONMENT: Lives with: lives with their family Lives in: House/apartment Stairs: Yes: External: 4 steps; on right going up Has following equipment at home: [name] - 2 wheeled and shower chair  OCCUPATION: n/a  PLOF: Needs assistance with ADLs and Needs assistance with gait  PATIENT GOALS: walk without  device   NEXT MD VISIT: 06/04/2024  OBJECTIVE:  Note: Objective measures were completed at Evaluation unless otherwise noted.  PATIENT SURVEYS:  LEFS  Extreme difficulty/unable (0), Quite a bit of difficulty (1), Moderate difficulty (2), Little difficulty (3), No difficulty (4) Survey date:  05/21/2024   Any of your usual work, housework or school activities 0  2. Usual hobbies, recreational or sporting activities  1  3. Getting into/out of the bath 0  4. Walking between rooms 1  5. Putting on socks/shoes 2  6. Squatting  0  7. Lifting an object, like a bag of groceries from the floor 1  8. Performing light activities around your home 1  9. Performing heavy activities around your home 0  10. Getting into/out of a car 1  11. Walking 2 blocks 1  12. Walking 1 mile 0  13. Going up/down 10 stairs (1 flight) 0  14. Standing for 1 hour 0  15.  sitting for 1 hour 2  16. Running on even ground 1  17. Running on uneven ground 1  18. Making sharp turns while running fast 0  19. Hopping  0  20. Rolling over in bed 2  Score total:  14/80     COGNITION: Overall cognitive status: Impaired    OBSERVATION:   Arrives with walker, stocking and bandage on   SENSATION: WFL  EDEMA:  Circumferential: deferred  PALPATION: Tenderness around incision   LOWER EXTREMITY ROM:   A/PROM Right eval Left eval  Hip flexion    Hip extension    Hip abduction    Hip adduction    Hip internal rotation    Hip external rotation    Knee flexion  90*; 95* PROM  Knee extension  -40*; -35* PROM  Ankle dorsiflexion    Ankle plantarflexion    Ankle inversion    Ankle eversion     (Blank rows = not tested)(* = pain)   LOWER EXTREMITY MMT: deferred this date due to limited ROM  MMT Right eval Left eval  Hip flexion    Hip extension    Hip abduction    Hip adduction    Hip internal rotation    Hip external rotation    Knee flexion    Knee extension    Ankle dorsiflexion    Ankle plantarflexion    Ankle inversion    Ankle eversion     (Blank rows = not tested)  FUNCTIONAL TESTS:  30 seconds chair stand test; deferred 2 minute walk test: deferred  GAIT: Distance walked: 58ft total  Assistive device utilized: Walker - 2 wheeled Level of assistance: SBA Comments: slow gait with majority of support for walker                                                                                                                                  TREATMENT DATE:  [facility] Adult PT Treatment:  DATE: 05/21/2024   Therapeutic Exercise:  Hamstring stretch with pt OP to knee 2x30  Glute set 10x5   Manual:  PROM knee flexion and extension   PATIENT EDUCATION:  Education details: POC, HEP, diagnosis, prognosis. Person educated: Patient Education method: Explanation, Demonstration, Tactile cues, Verbal cues, and Handouts Education comprehension: verbalized understanding, returned demonstration, verbal cues required, tactile cues required, and needs further education  HOME EXERCISE PROGRAM: Access Code: [email or web address] URL: [email or web address] Date: 05/21/2024 Prepared by: Marijo Berber  Exercises - Supine Heel Slide with Strap  - 2-3 x daily - 7 x weekly - 2 sets - 10 reps - 10s hold - Seated Hamstring Stretch  - 2-3 x daily - 7 x weekly - 2 sets - 10 reps - 10s hold - Supine Gluteal Sets  - 2-3 x daily - 7 x weekly - 2 sets - 10 reps - 10s hold - Supine Quad Set  - 2-3 x daily - 7 x weekly - 2 sets - 10 reps - 10s hold  ASSESSMENT:  CLINICAL IMPRESSION: Patient is a 63 y.o. female who was seen today for physical therapy evaluation and treatment for L TKA. She had surgery on 04/27/2024. Pt has been using a walker since surgery. She has poor knee mobility at 3 and a half weeks post op. Pt is not tolerable to PROM with hard end feels. The pt will benefit from skilled physical therapy to return decrease pain and increase function.    OBJECTIVE IMPAIRMENTS: Abnormal gait, decreased activity tolerance, decreased balance, decreased endurance, decreased mobility, difficulty walking, decreased ROM, decreased strength, hypomobility, increased edema, impaired flexibility, and pain.   ACTIVITY LIMITATIONS: lifting, bending, sitting, standing, squatting, sleeping, stairs, transfers, bed mobility, bathing, toileting, dressing, and locomotion  level  PARTICIPATION LIMITATIONS: cleaning, laundry, driving, and shopping  PERSONAL FACTORS: Behavior pattern, Education, Fitness, Past/current experiences, Social background, Transportation, and 3+ comorbidities: Drug abuse, DM II, liver diease, MDD are also affecting patient's functional outcome.   REHAB POTENTIAL: Fair due to comorbidities   CLINICAL DECISION MAKING: Evolving/moderate complexity  EVALUATION COMPLEXITY: Moderate   GOALS: Goals reviewed with patient? Yes  SHORT TERM GOALS: Target date: 06/11/2024 Pt will be compliant and independent with HEP to assist with symptom management/recovery at home.  Baseline: [id number] Goal status: INITIAL  2.  Pt will achieve a 10 degree increase in AROM knee extension to assist with walking.  Baseline: -45 Goal status: INITIAL  3.  Pt will achieve -10 degrees or greater knee extension PROM to assist with walking.  Baseline: -40 Goal status: INITIAL   LONG TERM GOALS: Target date: 07/02/2024  Pt will achieve 100 degrees AROM knee flexion to assist with stairs, dressing, and ADLs.  Baseline: 90 degrees  Goal status: INITIAL  2.  Pt will be able to walk with a SPC for 160ft.  Baseline: using walker  Goal status: INITIAL  3.  Pt will demonstrate 4/5 or greater strength of the knee to assist with ADLs.  Baseline: less than 3/5  Goal status: INITIAL  4.  Pt will be comfortable with her final HEP in order to continue any symptom management at home and to avoid regression.   Baseline: [id number] Goal status: INITIAL   PLAN:  PT FREQUENCY: 1-2x/week  PT DURATION: 6 weeks  PLANNED INTERVENTIONS: 97110-Therapeutic exercises, 97530- Therapeutic activity, [id number]- Neuromuscular re-education, 97535- Self Care, [id number]- Manual therapy, [phone]- Gait training, [phone]- Aquatic Therapy, [phone]- Vasopneumatic device, Patient/Family education, Balance training, Stair training, Joint mobilization, Scar mobilization, Cryotherapy, and Moist  heat  PLAN FOR NEXT SESSION: knee ROM, 2 MWT, 30s STS, knee strengthening, hip strengthening    Marijo DELENA Berber, PT 05/21/2024, 10:35 AM

## 2024-06-03 ENCOUNTER — Ambulatory Visit: Payer: MEDICAID

## 2024-06-03 DIAGNOSIS — Z96652 Presence of left artificial knee joint: Secondary | ICD-10-CM | POA: Insufficient documentation

## 2024-06-03 NOTE — Therapy (Signed)
 OUTPATIENT PHYSICAL THERAPY LOWER EXTREMITY EVALUATION   Patient Name: [name] MRN: [id number] DOB:02-06-1961, 63 y.o., female Today's Date: 06/03/2024  END OF SESSION:  PT End of Session - 06/03/24 0842     Visit Number 2    Number of Visits 12    Date for Recertification  07/02/24    Authorization Type [employer]    Progress Note Due on Visit 10    PT Start Time 0822    PT Stop Time 0900    PT Time Calculation (min) 38 min    Activity Tolerance Patient limited by pain    Behavior During Therapy WFL for tasks assessed/performed           Past Medical History:  Diagnosis Date   Arthritis    Cocaine abuse (HCC)    Depression    Diabetes mellitus without complication (HCC)    ETOH abuse    Hypothyroidism    Unspecified mood (affective) disorder    Past Surgical History:  Procedure Laterality Date   radioactive iodine  ablation     graves disease s/p   TOTAL KNEE ARTHROPLASTY Right 05/09/2018   Procedure: TOTAL KNEE ARTHROPLASTY;  Surgeon: Katherine Rush, MD;  Location: [facility];  Service: Orthopedics;  Laterality: Right;   TOTAL KNEE ARTHROPLASTY Left 04/27/2024   Procedure: ARTHROPLASTY, KNEE, TOTAL;  Surgeon: Katherine Rush, MD;  Location: [facility];  Service: Orthopedics;  Laterality: Left;   Patient Active Problem List   Diagnosis Date Noted   Preoperative examination 03/13/2024   Onychomycosis 11/28/2022   Controlled type 2 diabetes mellitus with complication, without long-term current use of insulin  (HCC) 08/10/2020   Bilateral primary osteoarthritis of knee 05/09/2018   Degenerative arthritis of left knee 09/12/2017   MDD (major depressive disorder), recurrent severe, without psychosis (HCC) 11/03/2016   Poor dentition 06/26/2016   Hyperlipidemia 06/26/2016   Chronic liver disease 08/29/2015   Health care maintenance 12/31/2013   Alcohol use disorder, moderate, dependence (HCC)    Primary vulvar squamous cell carcinoma s/p resection 2011 02/13/2010    Postablative hypothyroidism 04/12/2006   TOBACCO ABUSE 04/12/2006    PCP: Katherine Perkins, DO  REFERRING PROVIDER: Yvone [name] HERO  REFERRING DIAG: M17.12 (ICD-10-CM) - Unilateral primary osteoarthritis, left knee   THERAPY DIAG:  Status post total knee replacement, left  Rationale for Evaluation and Treatment: Rehabilitation  ONSET DATE: 04/27/2024  SUBJECTIVE:   SUBJECTIVE STATEMENT: 06/03/2024 Pt reports continued pain when walking and bending the knee. She notes compliance with HEP. Pt cannot recall next MD appointment when asked. She denies pain in the calf and discharge from the incision. She reports needing to leave at 9am.  Pt reports a L TKA on 04/27/2024. Prior to surgery, she was using an AD or was hopping around. Since the surgery, [name] has been using a walker. She reports pain with most movement and has been performing exercises at home (ankle pumps). She saw her surgeon about 2 weeks ago who advised return in 1 month/4 weeks. Pt has not noticed any drainage from her incision. She denies fever and tenderness around calf.   PERTINENT HISTORY: Drug abuse, DM II, liver diease, MDD PAIN:  Are you having pain? Yes: NPRS scale: 7/10; 7/10 worst; 3/10 best Pain location: over incision Pain description: ache Aggravating factors: movement Relieving factors: ice  PRECAUTIONS: Knee  RED FLAGS: None   WEIGHT BEARING RESTRICTIONS: No  FALLS:  Has patient fallen in last 6 months? No  LIVING ENVIRONMENT: Lives with: lives with their  family Lives in: House/apartment Stairs: Yes: External: 4 steps; on right going up Has following equipment at home: [name] - 2 wheeled and shower chair  OCCUPATION: n/a  PLOF: Needs assistance with ADLs and Needs assistance with gait  PATIENT GOALS: walk without device   NEXT MD VISIT: 06/04/2024  OBJECTIVE:  Note: Objective measures were completed at Evaluation unless otherwise noted.  PATIENT SURVEYS:  LEFS  Extreme  difficulty/unable (0), Quite a bit of difficulty (1), Moderate difficulty (2), Little difficulty (3), No difficulty (4) Survey date:  05/21/2024   Any of your usual work, housework or school activities 0  2. Usual hobbies, recreational or sporting activities 1  3. Getting into/out of the bath 0  4. Walking between rooms 1  5. Putting on socks/shoes 2  6. Squatting  0  7. Lifting an object, like a bag of groceries from the floor 1  8. Performing light activities around your home 1  9. Performing heavy activities around your home 0  10. Getting into/out of a car 1  11. Walking 2 blocks 1  12. Walking 1 mile 0  13. Going up/down 10 stairs (1 flight) 0  14. Standing for 1 hour 0  15.  sitting for 1 hour 2  16. Running on even ground 1  17. Running on uneven ground 1  18. Making sharp turns while running fast 0  19. Hopping  0  20. Rolling over in bed 2  Score total:  14/80     COGNITION: Overall cognitive status: Impaired    OBSERVATION:   Arrives with walker, stocking and bandage on   SENSATION: WFL  EDEMA:  Circumferential: deferred  PALPATION: Tenderness around incision   LOWER EXTREMITY ROM:   A/PROM Right eval Left eval  Hip flexion    Hip extension    Hip abduction    Hip adduction    Hip internal rotation    Hip external rotation    Knee flexion  90*; 95* PROM  Knee extension  -40*; -35* PROM  Ankle dorsiflexion    Ankle plantarflexion    Ankle inversion    Ankle eversion     (Blank rows = not tested)(* = pain)   LOWER EXTREMITY MMT: deferred this date due to limited ROM  MMT Right eval Left eval  Hip flexion    Hip extension    Hip abduction    Hip adduction    Hip internal rotation    Hip external rotation    Knee flexion    Knee extension    Ankle dorsiflexion    Ankle plantarflexion    Ankle inversion    Ankle eversion     (Blank rows = not tested)  FUNCTIONAL TESTS:  30 seconds chair stand test: 4.5 (ended standing) with UE  support 2 minute walk test: 101 ft with walker  GAIT: Distance walked: 4ft total  Assistive device utilized: Walker - 2 wheeled Level of assistance: SBA Comments: slow gait with majority of support for walker  TREATMENT DATE:  [facility] Adult PT Treatment:                                                DATE: 06/03/2024  Therapeutic Exercise:  Quad set 20x5 hold DKTC with ball x 20  LAQ x 15  Seated knee flexion AAROM with opposite LE x 10  Tests and measures   Manual:  PROM knee flexion and extension   [facility] Adult PT Treatment:                                                DATE: 05/21/2024   Therapeutic Exercise:  Hamstring stretch with pt OP to knee 2x30  Glute set 10x5   Manual:  PROM knee flexion and extension   PATIENT EDUCATION:  Education details: POC, HEP, diagnosis, prognosis. Person educated: Patient Education method: Explanation, Demonstration, Tactile cues, Verbal cues, and Handouts Education comprehension: verbalized understanding, returned demonstration, verbal cues required, tactile cues required, and needs further education  HOME EXERCISE PROGRAM: Access Code: [email or web address] URL: [email or web address] Date: 05/21/2024 Prepared by: Marijo Berber  Exercises - Supine Heel Slide with Strap  - 2-3 x daily - 7 x weekly - 2 sets - 10 reps - 10s hold - Seated Hamstring Stretch  - 2-3 x daily - 7 x weekly - 2 sets - 10 reps - 10s hold - Supine Gluteal Sets  - 2-3 x daily - 7 x weekly - 2 sets - 10 reps - 10s hold - Supine Quad Set  - 2-3 x daily - 7 x weekly - 2 sets - 10 reps - 10s hold  ASSESSMENT:  CLINICAL IMPRESSION: 06/03/2024 Pt arrives with walker. Her knee is warm to palpation but pt notes no signs of infection. Unable to view knee incision today. Hard end feel with PROM knee extension and flexion. Pt unable to  achieve terminal knee extension in any position. Introduced ROM and strengthening exercises with some tests/measures taken. The pt will benefit from skilled physical therapy to decrease pain and increase function.   Patient is a 63 y.o. female who was seen today for physical therapy evaluation and treatment for L TKA. She had surgery on 04/27/2024. Pt has been using a walker since surgery. She has poor knee mobility at 3 and a half weeks post op. Pt is not tolerable to PROM with hard end feels. The pt will benefit from skilled physical therapy to return decrease pain and increase function.    OBJECTIVE IMPAIRMENTS: Abnormal gait, decreased activity tolerance, decreased balance, decreased endurance, decreased mobility, difficulty walking, decreased ROM, decreased strength, hypomobility, increased edema, impaired flexibility, and pain.   ACTIVITY LIMITATIONS: lifting, bending, sitting, standing, squatting, sleeping, stairs, transfers, bed mobility, bathing, toileting, dressing, and locomotion level  PARTICIPATION LIMITATIONS: cleaning, laundry, driving, and shopping  PERSONAL FACTORS: Behavior pattern, Education, Fitness, Past/current experiences, Social background, Transportation, and 3+ comorbidities: Drug abuse, DM II, liver diease, MDD are also affecting patient's functional outcome.   REHAB POTENTIAL: Fair due to comorbidities   CLINICAL DECISION MAKING: Evolving/moderate complexity  EVALUATION COMPLEXITY: Moderate   GOALS: Goals reviewed with patient? Yes  SHORT TERM GOALS: Target date: 06/11/2024 Pt will be compliant and independent with HEP to assist with symptom management/recovery  at home.  Baseline: [id number] Goal status: INITIAL  2.  Pt will achieve a 10 degree increase in AROM knee extension to assist with walking.  Baseline: -45 Goal status: INITIAL  3.  Pt will achieve -10 degrees or greater knee extension PROM to assist with walking.  Baseline: -40 Goal status:  INITIAL   LONG TERM GOALS: Target date: 07/02/2024  Pt will achieve 100 degrees AROM knee flexion to assist with stairs, dressing, and ADLs.  Baseline: 90 degrees  Goal status: INITIAL  2.  Pt will be able to walk with a SPC for 159ft.  Baseline: using walker  Goal status: INITIAL  3.  Pt will demonstrate 4/5 or greater strength of the knee to assist with ADLs.  Baseline: less than 3/5  Goal status: INITIAL  4.  Pt will be comfortable with her final HEP in order to continue any symptom management at home and to avoid regression.   Baseline: [id number] Goal status: INITIAL   PLAN:  PT FREQUENCY: 1-2x/week  PT DURATION: 6 weeks  PLANNED INTERVENTIONS: 97110-Therapeutic exercises, 97530- Therapeutic activity, 97112- Neuromuscular re-education, 97535- Self Care, 02859- Manual therapy, [phone]- Gait training, [phone]- Aquatic Therapy, [phone]- Vasopneumatic device, Patient/Family education, Balance training, Stair training, Joint mobilization, Scar mobilization, Cryotherapy, and Moist heat  PLAN FOR NEXT SESSION: knee ROM, knee strengthening, hip strengthening    Marijo DELENA Berber, PT 06/03/2024, 8:43 AM

## 2024-06-04 ENCOUNTER — Ambulatory Visit: Payer: MEDICAID

## 2024-06-10 ENCOUNTER — Ambulatory Visit: Payer: MEDICAID

## 2024-06-10 DIAGNOSIS — Z96652 Presence of left artificial knee joint: Secondary | ICD-10-CM

## 2024-06-10 NOTE — Therapy (Signed)
 OUTPATIENT PHYSICAL THERAPY LOWER EXTREMITY EVALUATION   Patient Name: [name] MRN: [id number] DOB:02/06/1961, 63 y.o., female Today's Date: 06/10/2024  END OF SESSION:  PT End of Session - 06/10/24 0828     Visit Number 3    Number of Visits 12    Date for Recertification  07/02/24    Authorization Type [employer]    Progress Note Due on Visit 10    PT Start Time 0830    PT Stop Time 0910    PT Time Calculation (min) 40 min            Past Medical History:  Diagnosis Date   Arthritis    Cocaine abuse (HCC)    Depression    Diabetes mellitus without complication (HCC)    ETOH abuse    Hypothyroidism    Unspecified mood (affective) disorder    Past Surgical History:  Procedure Laterality Date   radioactive iodine  ablation     graves disease s/p   TOTAL KNEE ARTHROPLASTY Right 05/09/2018   Procedure: TOTAL KNEE ARTHROPLASTY;  Surgeon: Yvone Rush, MD;  Location: [facility];  Service: Orthopedics;  Laterality: Right;   TOTAL KNEE ARTHROPLASTY Left 04/27/2024   Procedure: ARTHROPLASTY, KNEE, TOTAL;  Surgeon: Yvone Rush, MD;  Location: [facility];  Service: Orthopedics;  Laterality: Left;   Patient Active Problem List   Diagnosis Date Noted   Preoperative examination 03/13/2024   Onychomycosis 11/28/2022   Controlled type 2 diabetes mellitus with complication, without long-term current use of insulin  (HCC) 08/10/2020   Bilateral primary osteoarthritis of knee 05/09/2018   Degenerative arthritis of left knee 09/12/2017   MDD (major depressive disorder), recurrent severe, without psychosis (HCC) 11/03/2016   Poor dentition 06/26/2016   Hyperlipidemia 06/26/2016   Chronic liver disease 08/29/2015   Health care maintenance 12/31/2013   Alcohol use disorder, moderate, dependence (HCC)    Primary vulvar squamous cell carcinoma s/p resection 2011 02/13/2010   Postablative hypothyroidism 04/12/2006   TOBACCO ABUSE 04/12/2006    PCP: Kandis Perkins, DO  REFERRING  PROVIDER: Yvone Rush HERO  REFERRING DIAG: M17.12 (ICD-10-CM) - Unilateral primary osteoarthritis, left knee   THERAPY DIAG:  Status post total knee replacement, left  Rationale for Evaluation and Treatment: Rehabilitation  ONSET DATE: 04/27/2024  SUBJECTIVE:   SUBJECTIVE STATEMENT: 06/10/2024 Pt reports better movement and less pain this week. She has yet to make a follow up with the surgeon. Pt asked to call their office today.   Pt reports a L TKA on 04/27/2024. Prior to surgery, she was using an AD or was hopping around. Since the surgery, Ethelene has been using a walker. She reports pain with most movement and has been performing exercises at home (ankle pumps). She saw her surgeon about 2 weeks ago who advised return in 1 month/4 weeks. Pt has not noticed any drainage from her incision. She denies fever and tenderness around calf.   PERTINENT HISTORY: Drug abuse, DM II, liver diease, MDD PAIN:  Are you having pain? Yes: NPRS scale: 7/10; 7/10 worst; 3/10 best Pain location: over incision Pain description: ache Aggravating factors: movement Relieving factors: ice  PRECAUTIONS: Knee  RED FLAGS: None   WEIGHT BEARING RESTRICTIONS: No  FALLS:  Has patient fallen in last 6 months? No  LIVING ENVIRONMENT: Lives with: lives with their family Lives in: House/apartment Stairs: Yes: External: 4 steps; on right going up Has following equipment at home: [name] - 2 wheeled and shower chair  OCCUPATION: n/a  PLOF: Needs  assistance with ADLs and Needs assistance with gait  PATIENT GOALS: walk without device   NEXT MD VISIT: 06/04/2024  OBJECTIVE:  Note: Objective measures were completed at Evaluation unless otherwise noted.  PATIENT SURVEYS:  LEFS  Extreme difficulty/unable (0), Quite a bit of difficulty (1), Moderate difficulty (2), Little difficulty (3), No difficulty (4) Survey date:  05/21/2024   Any of your usual work, housework or school activities 0  2. Usual  hobbies, recreational or sporting activities 1  3. Getting into/out of the bath 0  4. Walking between rooms 1  5. Putting on socks/shoes 2  6. Squatting  0  7. Lifting an object, like a bag of groceries from the floor 1  8. Performing light activities around your home 1  9. Performing heavy activities around your home 0  10. Getting into/out of a car 1  11. Walking 2 blocks 1  12. Walking 1 mile 0  13. Going up/down 10 stairs (1 flight) 0  14. Standing for 1 hour 0  15.  sitting for 1 hour 2  16. Running on even ground 1  17. Running on uneven ground 1  18. Making sharp turns while running fast 0  19. Hopping  0  20. Rolling over in bed 2  Score total:  14/80     COGNITION: Overall cognitive status: Impaired    OBSERVATION:   Arrives with walker, stocking and bandage on   SENSATION: WFL  EDEMA:  Circumferential: deferred  PALPATION: Tenderness around incision   LOWER EXTREMITY ROM:   A/PROM Right eval Left eval  Hip flexion    Hip extension    Hip abduction    Hip adduction    Hip internal rotation    Hip external rotation    Knee flexion  90*; 95* PROM  Knee extension  -40*; -35* PROM  Ankle dorsiflexion    Ankle plantarflexion    Ankle inversion    Ankle eversion     (Blank rows = not tested)(* = pain)   LOWER EXTREMITY MMT: deferred this date due to limited ROM  MMT Right eval Left eval  Hip flexion    Hip extension    Hip abduction    Hip adduction    Hip internal rotation    Hip external rotation    Knee flexion    Knee extension    Ankle dorsiflexion    Ankle plantarflexion    Ankle inversion    Ankle eversion     (Blank rows = not tested)  FUNCTIONAL TESTS:  30 seconds chair stand test: 4.5 (ended standing) with UE support 2 minute walk test: 101 ft with walker  GAIT: Distance walked: 48ft total  Assistive device utilized: Walker - 2 wheeled Level of assistance: SBA Comments: slow gait with majority of support for walker  TREATMENT DATE:  [facility] Adult PT Treatment:                                                DATE: 06/10/2024  Therapeutic Exercise:  Supine knee extension creep stretch with ankle on 1/2 foam roll x 4 mins  Quad set 20x5 hold in SAQ position  DKTC with ball x 20  LAQ 5x10 hold Seated knee flexion and extension AAROM with opposite LE x 10 each STS staggered with L foot behind x 10  NuStep level 3, UE and LE, x 6 mins  Manual:  PROM knee flexion and extension  P-A tibial mobs in knee flexion grade III  [facility] Adult PT Treatment:                                                DATE: 05/21/2024   Therapeutic Exercise:  Hamstring stretch with pt OP to knee 2x30  Glute set 10x5   Manual:  PROM knee flexion and extension   PATIENT EDUCATION:  Education details: POC, HEP, diagnosis, prognosis. Person educated: Patient Education method: Explanation, Demonstration, Tactile cues, Verbal cues, and Handouts Education comprehension: verbalized understanding, returned demonstration, verbal cues required, tactile cues required, and needs further education  HOME EXERCISE PROGRAM: Access Code: [email or web address] URL: [email or web address] Date: 05/21/2024 Prepared by: Marijo Berber  Exercises - Supine Heel Slide with Strap  - 2-3 x daily - 7 x weekly - 2 sets - 10 reps - 10s hold - Seated Hamstring Stretch  - 2-3 x daily - 7 x weekly - 2 sets - 10 reps - 10s hold - Supine Gluteal Sets  - 2-3 x daily - 7 x weekly - 2 sets - 10 reps - 10s hold - Supine Quad Set  - 2-3 x daily - 7 x weekly - 2 sets - 10 reps - 10s hold  ASSESSMENT:  CLINICAL IMPRESSION: 06/10/2024 Pt arrives with walker. She was instructed through additional ROM and strengthening exercises today with mild pain increase. Pt has a hard end feel into knee flexion that caps out at ~95. Her knee  extension end feel is soft but she continues to lack terminal. When asked about the HEP, the pt notes performing it once in past several days to a week. Her poor compliance and current ROM limits her ability to walk without a device.   The pt will benefit from skilled physical therapy to decrease pain and increase function.   Patient is a 63 y.o. female who was seen today for physical therapy evaluation and treatment for L TKA. She had surgery on 04/27/2024. Pt has been using a walker since surgery. She has poor knee mobility at 3 and a half weeks post op. Pt is not tolerable to PROM with hard end feels. The pt will benefit from skilled physical therapy to return decrease pain and increase function.    OBJECTIVE IMPAIRMENTS: Abnormal gait, decreased activity tolerance, decreased balance, decreased endurance, decreased mobility, difficulty walking, decreased ROM, decreased strength, hypomobility, increased edema, impaired flexibility, and pain.   ACTIVITY LIMITATIONS: lifting, bending, sitting, standing, squatting, sleeping, stairs, transfers, bed mobility, bathing, toileting, dressing, and locomotion level  PARTICIPATION LIMITATIONS: cleaning, laundry, driving, and shopping  PERSONAL FACTORS: Behavior pattern,  Education, Fitness, Past/current experiences, Social background, Transportation, and 3+ comorbidities: Drug abuse, DM II, liver diease, MDD are also affecting patient's functional outcome.   REHAB POTENTIAL: Fair due to comorbidities   CLINICAL DECISION MAKING: Evolving/moderate complexity  EVALUATION COMPLEXITY: Moderate   GOALS: Goals reviewed with patient? Yes  SHORT TERM GOALS: Target date: 06/11/2024 Pt will be compliant and independent with HEP to assist with symptom management/recovery at home.  Baseline: [id number] Goal status: INITIAL  2.  Pt will achieve a 10 degree increase in AROM knee extension to assist with walking.  Baseline: -45 Goal status: INITIAL  3.  Pt will  achieve -10 degrees or greater knee extension PROM to assist with walking.  Baseline: -40 Goal status: INITIAL   LONG TERM GOALS: Target date: 07/02/2024  Pt will achieve 100 degrees AROM knee flexion to assist with stairs, dressing, and ADLs.  Baseline: 90 degrees  Goal status: INITIAL  2.  Pt will be able to walk with a SPC for 166ft.  Baseline: using walker  Goal status: INITIAL  3.  Pt will demonstrate 4/5 or greater strength of the knee to assist with ADLs.  Baseline: less than 3/5  Goal status: INITIAL  4.  Pt will be comfortable with her final HEP in order to continue any symptom management at home and to avoid regression.   Baseline: [id number] Goal status: INITIAL   PLAN:  PT FREQUENCY: 1-2x/week  PT DURATION: 6 weeks  PLANNED INTERVENTIONS: 97110-Therapeutic exercises, 97530- Therapeutic activity, 97112- Neuromuscular re-education, 97535- Self Care, [id number]- Manual therapy, [phone]- Gait training, [phone]- Aquatic Therapy, [phone]- Vasopneumatic device, Patient/Family education, Balance training, Stair training, Joint mobilization, Scar mobilization, Cryotherapy, and Moist heat  PLAN FOR NEXT SESSION: knee ROM, knee strengthening, hip strengthening    Marijo DELENA Berber, PT 06/10/2024, 9:07 AM

## 2024-06-11 ENCOUNTER — Ambulatory Visit: Payer: MEDICAID

## 2024-06-11 DIAGNOSIS — Z96652 Presence of left artificial knee joint: Secondary | ICD-10-CM | POA: Diagnosis not present

## 2024-06-11 NOTE — Therapy (Signed)
 OUTPATIENT PHYSICAL THERAPY LOWER EXTREMITY EVALUATION   Patient Name: [name] MRN: [id number] DOB:09-12-1960, 63 y.o., female Today's Date: 06/11/2024  END OF SESSION:  PT End of Session - 06/11/24 1043     Visit Number 4    Number of Visits 12    Date for Recertification  07/02/24    Authorization Type [employer]    PT Start Time 1047    PT Stop Time 1125    PT Time Calculation (min) 38 min    Activity Tolerance Patient limited by pain    Behavior During Therapy WFL for tasks assessed/performed            Past Medical History:  Diagnosis Date   Arthritis    Cocaine abuse (HCC)    Depression    Diabetes mellitus without complication (HCC)    ETOH abuse    Hypothyroidism    Unspecified mood (affective) disorder    Past Surgical History:  Procedure Laterality Date   radioactive iodine  ablation     graves disease s/p   TOTAL KNEE ARTHROPLASTY Right 05/09/2018   Procedure: TOTAL KNEE ARTHROPLASTY;  Surgeon: Yvone Rush, MD;  Location: [facility];  Service: Orthopedics;  Laterality: Right;   TOTAL KNEE ARTHROPLASTY Left 04/27/2024   Procedure: ARTHROPLASTY, KNEE, TOTAL;  Surgeon: Yvone Rush, MD;  Location: [facility];  Service: Orthopedics;  Laterality: Left;   Patient Active Problem List   Diagnosis Date Noted   Preoperative examination 03/13/2024   Onychomycosis 11/28/2022   Controlled type 2 diabetes mellitus with complication, without long-term current use of insulin  (HCC) 08/10/2020   Bilateral primary osteoarthritis of knee 05/09/2018   Degenerative arthritis of left knee 09/12/2017   MDD (major depressive disorder), recurrent severe, without psychosis (HCC) 11/03/2016   Poor dentition 06/26/2016   Hyperlipidemia 06/26/2016   Chronic liver disease 08/29/2015   Health care maintenance 12/31/2013   Alcohol use disorder, moderate, dependence (HCC)    Primary vulvar squamous cell carcinoma s/p resection 2011 02/13/2010   Postablative hypothyroidism  04/12/2006   TOBACCO ABUSE 04/12/2006    PCP: Kandis Perkins, DO  REFERRING PROVIDER: Yvone Rush HERO  REFERRING DIAG: M17.12 (ICD-10-CM) - Unilateral primary osteoarthritis, left knee   THERAPY DIAG:  Status post total knee replacement, left  Rationale for Evaluation and Treatment: Rehabilitation  ONSET DATE: 04/27/2024  SUBJECTIVE:   SUBJECTIVE STATEMENT: 06/11/2024 Pt reports improved pain this session. She did make an appointment with her surgeon.  Pt reports a L TKA on 04/27/2024. Prior to surgery, she was using an AD or was hopping around. Since the surgery, Persephonie has been using a walker. She reports pain with most movement and has been performing exercises at home (ankle pumps). She saw her surgeon about 2 weeks ago who advised return in 1 month/4 weeks. Pt has not noticed any drainage from her incision. She denies fever and tenderness around calf.   PERTINENT HISTORY: Drug abuse, DM II, liver diease, MDD PAIN:  Are you having pain? Yes: NPRS scale: 7/10; 7/10 worst; 3/10 best Pain location: over incision Pain description: ache Aggravating factors: movement Relieving factors: ice  PRECAUTIONS: Knee  RED FLAGS: None   WEIGHT BEARING RESTRICTIONS: No  FALLS:  Has patient fallen in last 6 months? No  LIVING ENVIRONMENT: Lives with: lives with their family Lives in: House/apartment Stairs: Yes: External: 4 steps; on right going up Has following equipment at home: [name] - 2 wheeled and shower chair  OCCUPATION: n/a  PLOF: Needs assistance with ADLs and  Needs assistance with gait  PATIENT GOALS: walk without device   NEXT MD VISIT: 07/09/2024  OBJECTIVE:  Note: Objective measures were completed at Evaluation unless otherwise noted.  PATIENT SURVEYS:  LEFS  Extreme difficulty/unable (0), Quite a bit of difficulty (1), Moderate difficulty (2), Little difficulty (3), No difficulty (4) Survey date:  05/21/2024   Any of your usual work, housework or  school activities 0  2. Usual hobbies, recreational or sporting activities 1  3. Getting into/out of the bath 0  4. Walking between rooms 1  5. Putting on socks/shoes 2  6. Squatting  0  7. Lifting an object, like a bag of groceries from the floor 1  8. Performing light activities around your home 1  9. Performing heavy activities around your home 0  10. Getting into/out of a car 1  11. Walking 2 blocks 1  12. Walking 1 mile 0  13. Going up/down 10 stairs (1 flight) 0  14. Standing for 1 hour 0  15.  sitting for 1 hour 2  16. Running on even ground 1  17. Running on uneven ground 1  18. Making sharp turns while running fast 0  19. Hopping  0  20. Rolling over in bed 2  Score total:  14/80     COGNITION: Overall cognitive status: Impaired    OBSERVATION:   Arrives with walker, stocking and bandage on   SENSATION: WFL  EDEMA:  Circumferential: deferred  PALPATION: Tenderness around incision   LOWER EXTREMITY ROM:   A/PROM Right eval Left eval  Hip flexion    Hip extension    Hip abduction    Hip adduction    Hip internal rotation    Hip external rotation    Knee flexion  90*; 95* PROM  Knee extension  -40*; -35* PROM  Ankle dorsiflexion    Ankle plantarflexion    Ankle inversion    Ankle eversion     (Blank rows = not tested)(* = pain)   LOWER EXTREMITY MMT: deferred this date due to limited ROM  MMT Right eval Left eval  Hip flexion    Hip extension    Hip abduction    Hip adduction    Hip internal rotation    Hip external rotation    Knee flexion    Knee extension    Ankle dorsiflexion    Ankle plantarflexion    Ankle inversion    Ankle eversion     (Blank rows = not tested)  FUNCTIONAL TESTS:  30 seconds chair stand test: 4.5 (ended standing) with UE support 2 minute walk test: 101 ft with walker  GAIT: Distance walked: 34ft total  Assistive device utilized: Walker - 2 wheeled Level of assistance: SBA Comments: slow gait with  majority of support for walker  TREATMENT DATE:  [facility] Adult PT Treatment:                                                DATE: 06/11/2024  Therapeutic Exercise:  DKTC with ball x 20  SLR off of ball x 10  Hamstring curl with BTB x 10 B  LAQ 5x10 hold STS staggered with L foot behind x 10  NuStep level 4, UE and LE, x 8 mins  Manual:  PROM knee flexion and extension   [facility] Adult PT Treatment:                                                DATE: 05/21/2024   Therapeutic Exercise:  Hamstring stretch with pt OP to knee 2x30  Glute set 10x5   Manual:  PROM knee flexion and extension   PATIENT EDUCATION:  Education details: POC, HEP, diagnosis, prognosis. Person educated: Patient Education method: Explanation, Demonstration, Tactile cues, Verbal cues, and Handouts Education comprehension: verbalized understanding, returned demonstration, verbal cues required, tactile cues required, and needs further education  HOME EXERCISE PROGRAM: Access Code: [email or web address] URL: [email or web address] Date: 05/21/2024 Prepared by: Marijo Berber  Exercises - Supine Heel Slide with Strap  - 2-3 x daily - 7 x weekly - 2 sets - 10 reps - 10s hold - Seated Hamstring Stretch  - 2-3 x daily - 7 x weekly - 2 sets - 10 reps - 10s hold - Supine Gluteal Sets  - 2-3 x daily - 7 x weekly - 2 sets - 10 reps - 10s hold - Supine Quad Set  - 2-3 x daily - 7 x weekly - 2 sets - 10 reps - 10s hold  ASSESSMENT:  CLINICAL IMPRESSION: 06/11/2024 Pt continues to have limited knee ROM but pain has improved. She is walking with more stability using the walker. She was advised to look into getting cane as we may start to transition soon.  The pt will benefit from skilled physical therapy to decrease pain and increase function.   Patient is a 63 y.o. female who was seen today  for physical therapy evaluation and treatment for L TKA. She had surgery on 04/27/2024. Pt has been using a walker since surgery. She has poor knee mobility at 3 and a half weeks post op. Pt is not tolerable to PROM with hard end feels. The pt will benefit from skilled physical therapy to return decrease pain and increase function.    OBJECTIVE IMPAIRMENTS: Abnormal gait, decreased activity tolerance, decreased balance, decreased endurance, decreased mobility, difficulty walking, decreased ROM, decreased strength, hypomobility, increased edema, impaired flexibility, and pain.   ACTIVITY LIMITATIONS: lifting, bending, sitting, standing, squatting, sleeping, stairs, transfers, bed mobility, bathing, toileting, dressing, and locomotion level  PARTICIPATION LIMITATIONS: cleaning, laundry, driving, and shopping  PERSONAL FACTORS: Behavior pattern, Education, Fitness, Past/current experiences, Social background, Transportation, and 3+ comorbidities: Drug abuse, DM II, liver diease, MDD are also affecting patient's functional outcome.   REHAB POTENTIAL: Fair due to comorbidities   CLINICAL DECISION MAKING: Evolving/moderate complexity  EVALUATION COMPLEXITY: Moderate   GOALS: Goals reviewed with patient? Yes  SHORT TERM GOALS: Target date: 06/11/2024 Pt will be compliant and independent with HEP to assist with symptom management/recovery at  home.  Baseline: [id number] Goal status: INITIAL  2.  Pt will achieve a 10 degree increase in AROM knee extension to assist with walking.  Baseline: -45 Goal status: INITIAL  3.  Pt will achieve -10 degrees or greater knee extension PROM to assist with walking.  Baseline: -40 Goal status: INITIAL   LONG TERM GOALS: Target date: 07/02/2024  Pt will achieve 100 degrees AROM knee flexion to assist with stairs, dressing, and ADLs.  Baseline: 90 degrees  Goal status: INITIAL  2.  Pt will be able to walk with a SPC for 180ft.  Baseline: using walker   Goal status: INITIAL  3.  Pt will demonstrate 4/5 or greater strength of the knee to assist with ADLs.  Baseline: less than 3/5  Goal status: INITIAL  4.  Pt will be comfortable with her final HEP in order to continue any symptom management at home and to avoid regression.   Baseline: [id number] Goal status: INITIAL   PLAN:  PT FREQUENCY: 1-2x/week  PT DURATION: 6 weeks  PLANNED INTERVENTIONS: 97110-Therapeutic exercises, 97530- Therapeutic activity, 97112- Neuromuscular re-education, 97535- Self Care, 02859- Manual therapy, [phone]- Gait training, [phone]- Aquatic Therapy, [phone]- Vasopneumatic device, Patient/Family education, Balance training, Stair training, Joint mobilization, Scar mobilization, Cryotherapy, and Moist heat  PLAN FOR NEXT SESSION: knee ROM, knee strengthening, hip strengthening    Marijo DELENA Berber, PT 06/11/2024, 11:57 AM

## 2024-06-17 ENCOUNTER — Ambulatory Visit: Payer: MEDICAID

## 2024-06-17 DIAGNOSIS — Z96652 Presence of left artificial knee joint: Secondary | ICD-10-CM

## 2024-06-17 NOTE — Therapy (Signed)
 OUTPATIENT PHYSICAL THERAPY TREATMENT   Patient Name: [name] MRN: [id number] DOB:12-Jan-1961, 63 y.o., female Today's Date: 06/17/2024  END OF SESSION:  PT End of Session - 06/17/24 1054     Visit Number 5    Number of Visits 12    Date for Recertification  07/02/24    Authorization Type [employer]    Progress Note Due on Visit 10    PT Start Time 1047    PT Stop Time 1110    PT Time Calculation (min) 23 min    Activity Tolerance Patient limited by pain    Behavior During Therapy WFL for tasks assessed/performed          Past Medical History:  Diagnosis Date   Arthritis    Cocaine abuse (HCC)    Depression    Diabetes mellitus without complication (HCC)    ETOH abuse    Hypothyroidism    Unspecified mood (affective) disorder    Past Surgical History:  Procedure Laterality Date   radioactive iodine  ablation     graves disease s/p   TOTAL KNEE ARTHROPLASTY Right 05/09/2018   Procedure: TOTAL KNEE ARTHROPLASTY;  Surgeon: Yvone Rush, MD;  Location: [facility];  Service: Orthopedics;  Laterality: Right;   TOTAL KNEE ARTHROPLASTY Left 04/27/2024   Procedure: ARTHROPLASTY, KNEE, TOTAL;  Surgeon: Yvone Rush, MD;  Location: [facility];  Service: Orthopedics;  Laterality: Left;   Patient Active Problem List   Diagnosis Date Noted   Preoperative examination 03/13/2024   Onychomycosis 11/28/2022   Controlled type 2 diabetes mellitus with complication, without long-term current use of insulin  (HCC) 08/10/2020   Bilateral primary osteoarthritis of knee 05/09/2018   Degenerative arthritis of left knee 09/12/2017   MDD (major depressive disorder), recurrent severe, without psychosis (HCC) 11/03/2016   Poor dentition 06/26/2016   Hyperlipidemia 06/26/2016   Chronic liver disease 08/29/2015   Health care maintenance 12/31/2013   Alcohol use disorder, moderate, dependence (HCC)    Primary vulvar squamous cell carcinoma s/p resection 2011 02/13/2010   Postablative  hypothyroidism 04/12/2006   TOBACCO ABUSE 04/12/2006    PCP: Kandis Perkins, DO  REFERRING PROVIDER: Yvone Rush HERO  REFERRING DIAG: M17.12 (ICD-10-CM) - Unilateral primary osteoarthritis, left knee   THERAPY DIAG:  Status post total knee replacement, left  Rationale for Evaluation and Treatment: Rehabilitation  ONSET DATE: 04/27/2024  SUBJECTIVE:   SUBJECTIVE STATEMENT: 06/17/2024 Pt reports feeling pretty good today. She is going to get a cane today. She reported needing to leave by 11:10 am today.  Pt reports a L TKA on 04/27/2024. Prior to surgery, she was using an AD or was hopping around. Since the surgery, Madeliene has been using a walker. She reports pain with most movement and has been performing exercises at home (ankle pumps). She saw her surgeon about 2 weeks ago who advised return in 1 month/4 weeks. Pt has not noticed any drainage from her incision. She denies fever and tenderness around calf.   PERTINENT HISTORY: Drug abuse, DM II, liver diease, MDD PAIN:  Are you having pain? Yes: NPRS scale: 7/10; 7/10 worst; 3/10 best Pain location: over incision Pain description: ache Aggravating factors: movement Relieving factors: ice  PRECAUTIONS: Knee  RED FLAGS: None   WEIGHT BEARING RESTRICTIONS: No  FALLS:  Has patient fallen in last 6 months? No  LIVING ENVIRONMENT: Lives with: lives with their family Lives in: House/apartment Stairs: Yes: External: 4 steps; on right going up Has following equipment at home: [name] - 2  wheeled and shower chair  OCCUPATION: n/a  PLOF: Needs assistance with ADLs and Needs assistance with gait  PATIENT GOALS: walk without device   NEXT MD VISIT: 07/09/2024  OBJECTIVE:  Note: Objective measures were completed at Evaluation unless otherwise noted.  PATIENT SURVEYS:  LEFS  Extreme difficulty/unable (0), Quite a bit of difficulty (1), Moderate difficulty (2), Little difficulty (3), No difficulty (4) Survey date:   05/21/2024   Any of your usual work, housework or school activities 0  2. Usual hobbies, recreational or sporting activities 1  3. Getting into/out of the bath 0  4. Walking between rooms 1  5. Putting on socks/shoes 2  6. Squatting  0  7. Lifting an object, like a bag of groceries from the floor 1  8. Performing light activities around your home 1  9. Performing heavy activities around your home 0  10. Getting into/out of a car 1  11. Walking 2 blocks 1  12. Walking 1 mile 0  13. Going up/down 10 stairs (1 flight) 0  14. Standing for 1 hour 0  15.  sitting for 1 hour 2  16. Running on even ground 1  17. Running on uneven ground 1  18. Making sharp turns while running fast 0  19. Hopping  0  20. Rolling over in bed 2  Score total:  14/80     COGNITION: Overall cognitive status: Impaired    OBSERVATION:   Arrives with walker, stocking and bandage on   SENSATION: WFL  EDEMA:  Circumferential: deferred  PALPATION: Tenderness around incision   LOWER EXTREMITY ROM:   A/PROM Right eval Left eval  Hip flexion    Hip extension    Hip abduction    Hip adduction    Hip internal rotation    Hip external rotation    Knee flexion  90*; 95* PROM  Knee extension  -40*; -35* PROM  Ankle dorsiflexion    Ankle plantarflexion    Ankle inversion    Ankle eversion     (Blank rows = not tested)(* = pain)   LOWER EXTREMITY MMT: deferred this date due to limited ROM  MMT Right eval Left eval  Hip flexion    Hip extension    Hip abduction    Hip adduction    Hip internal rotation    Hip external rotation    Knee flexion    Knee extension    Ankle dorsiflexion    Ankle plantarflexion    Ankle inversion    Ankle eversion     (Blank rows = not tested)  FUNCTIONAL TESTS:  30 seconds chair stand test: 4.5 (ended standing) with UE support 2 minute walk test: 101 ft with walker  GAIT: Distance walked: 74ft total  Assistive device utilized: Walker - 2 wheeled Level  of assistance: SBA Comments: slow gait with majority of support for walker  TREATMENT DATE:  [facility] Adult PT Treatment:                                                DATE: 06/17/2024  Therapeutic Exercise:  DKTC with ball x 20  Quad set with heel on 1/2 foam roll 10x5 NuStep level 5, LE only, x 8 mins  Manual:  PROM knee flexion and extension   [facility] Adult PT Treatment:                                                DATE: 05/21/2024   Therapeutic Exercise:  Hamstring stretch with pt OP to knee 2x30  Glute set 10x5   Manual:  PROM knee flexion and extension   PATIENT EDUCATION:  Education details: POC, HEP, diagnosis, prognosis. Person educated: Patient Education method: Explanation, Demonstration, Tactile cues, Verbal cues, and Handouts Education comprehension: verbalized understanding, returned demonstration, verbal cues required, tactile cues required, and needs further education  HOME EXERCISE PROGRAM: Access Code: [email or web address] URL: [email or web address] Date: 05/21/2024 Prepared by: Marijo Berber  Exercises - Supine Heel Slide with Strap  - 2-3 x daily - 7 x weekly - 2 sets - 10 reps - 10s hold - Seated Hamstring Stretch  - 2-3 x daily - 7 x weekly - 2 sets - 10 reps - 10s hold - Supine Gluteal Sets  - 2-3 x daily - 7 x weekly - 2 sets - 10 reps - 10s hold - Supine Quad Set  - 2-3 x daily - 7 x weekly - 2 sets - 10 reps - 10s hold  ASSESSMENT:  CLINICAL IMPRESSION: 06/17/2024 Continued with manual and ROM exercises with minimal improvement seen. Pt continues to lack about 20 degrees of knee extension and is mostly intolerable to manual.  The pt will benefit from skilled physical therapy to decrease pain and increase function.   Patient is a 63 y.o. female who was seen today for physical therapy evaluation and treatment for L  TKA. She had surgery on 04/27/2024. Pt has been using a walker since surgery. She has poor knee mobility at 3 and a half weeks post op. Pt is not tolerable to PROM with hard end feels. The pt will benefit from skilled physical therapy to return decrease pain and increase function.    OBJECTIVE IMPAIRMENTS: Abnormal gait, decreased activity tolerance, decreased balance, decreased endurance, decreased mobility, difficulty walking, decreased ROM, decreased strength, hypomobility, increased edema, impaired flexibility, and pain.   ACTIVITY LIMITATIONS: lifting, bending, sitting, standing, squatting, sleeping, stairs, transfers, bed mobility, bathing, toileting, dressing, and locomotion level  PARTICIPATION LIMITATIONS: cleaning, laundry, driving, and shopping  PERSONAL FACTORS: Behavior pattern, Education, Fitness, Past/current experiences, Social background, Transportation, and 3+ comorbidities: Drug abuse, DM II, liver diease, MDD are also affecting patient's functional outcome.   REHAB POTENTIAL: Fair due to comorbidities   CLINICAL DECISION MAKING: Evolving/moderate complexity  EVALUATION COMPLEXITY: Moderate   GOALS: Goals reviewed with patient? Yes  SHORT TERM GOALS: Target date: 06/11/2024 Pt will be compliant and independent with HEP to assist with symptom management/recovery at home.  Baseline: [id number] Goal status: INITIAL  2.  Pt will achieve a 10 degree increase in AROM knee extension to assist with walking.  Baseline: -45  Goal status: INITIAL  3.  Pt will achieve -10 degrees or greater knee extension PROM to assist with walking.  Baseline: -40 Goal status: INITIAL   LONG TERM GOALS: Target date: 07/02/2024  Pt will achieve 100 degrees AROM knee flexion to assist with stairs, dressing, and ADLs.  Baseline: 90 degrees  Goal status: INITIAL  2.  Pt will be able to walk with a SPC for 162ft.  Baseline: using walker  Goal status: INITIAL  3.  Pt will demonstrate 4/5 or  greater strength of the knee to assist with ADLs.  Baseline: less than 3/5  Goal status: INITIAL  4.  Pt will be comfortable with her final HEP in order to continue any symptom management at home and to avoid regression.   Baseline: [id number] Goal status: INITIAL   PLAN:  PT FREQUENCY: 1-2x/week  PT DURATION: 6 weeks  PLANNED INTERVENTIONS: 97110-Therapeutic exercises, 97530- Therapeutic activity, 97112- Neuromuscular re-education, 97535- Self Care, 02859- Manual therapy, [phone]- Gait training, [phone]- Aquatic Therapy, [phone]- Vasopneumatic device, Patient/Family education, Balance training, Stair training, Joint mobilization, Scar mobilization, Cryotherapy, and Moist heat  PLAN FOR NEXT SESSION: knee ROM, knee strengthening, hip strengthening    Marijo DELENA Berber, PT 06/17/2024, 11:23 AM

## 2024-06-18 ENCOUNTER — Ambulatory Visit: Payer: MEDICAID

## 2024-07-09 ENCOUNTER — Other Ambulatory Visit: Payer: Self-pay | Admitting: Student

## 2024-07-09 DIAGNOSIS — E890 Postprocedural hypothyroidism: Secondary | ICD-10-CM

## 2024-07-09 NOTE — Telephone Encounter (Signed)
 Copied from CRM [id number]. Topic: Clinical - Medication Refill >> Jul 09, 2024 11:53 AM Graeme ORN wrote: Medication: levothyroxine  (SYNTHROID ) 88 MCG tablet  Has the patient contacted their pharmacy? Yes (Agent: If no, request that the patient contact the pharmacy for the refill. If patient does not wish to contact the pharmacy document the reason why and proceed with request.) (Agent: If yes, when and what did the pharmacy advise?) Expired  This is the patient's preferred pharmacy:  [facility] [address], [place] - [address] [place] [address] Phone: [phone] Fax: [phone]   Is this the correct pharmacy for this prescription? Yes If no, delete pharmacy and type the correct one.   Has the prescription been filled recently? No  Is the patient out of the medication? No  Has the patient been seen for an appointment in the last year OR does the patient have an upcoming appointment? Yes  Can we respond through MyChart? No  Agent: Please be advised that Rx refills may take up to 3 business days. We ask that you follow-up with your pharmacy.

## 2024-07-10 MED ORDER — LEVOTHYROXINE SODIUM 88 MCG PO TABS: 88.0000 ug | ORAL_TABLET | Freq: Every day | ORAL | 0 refills | Status: AC

## 2024-07-15 ENCOUNTER — Ambulatory Visit: Payer: MEDICAID | Attending: Orthopedic Surgery

## 2024-07-15 DIAGNOSIS — R262 Difficulty in walking, not elsewhere classified: Secondary | ICD-10-CM | POA: Insufficient documentation

## 2024-07-15 DIAGNOSIS — M25562 Pain in left knee: Secondary | ICD-10-CM | POA: Insufficient documentation

## 2024-07-15 DIAGNOSIS — G8929 Other chronic pain: Secondary | ICD-10-CM | POA: Insufficient documentation

## 2024-07-15 DIAGNOSIS — Z96652 Presence of left artificial knee joint: Secondary | ICD-10-CM | POA: Insufficient documentation

## 2024-07-15 NOTE — Therapy (Incomplete)
 " OUTPATIENT PHYSICAL THERAPY NOTE RECERTIFICATION   Patient Name: [name] MRN: [id number] DOB:[birth year], 64 y.o., female Today's Date: 07/15/2024  END OF SESSION:    Past Medical History:  Diagnosis Date   Arthritis    Cocaine abuse (HCC)    Depression    Diabetes mellitus without complication (HCC)    ETOH abuse    Hypothyroidism    Unspecified mood (affective) disorder    Past Surgical History:  Procedure Laterality Date   radioactive iodine  ablation     graves disease s/p   TOTAL KNEE ARTHROPLASTY Right 05/09/2018   Procedure: TOTAL KNEE ARTHROPLASTY;  Surgeon: Yvone Rush, MD;  Location: [facility];  Service: Orthopedics;  Laterality: Right;   TOTAL KNEE ARTHROPLASTY Left 04/27/2024   Procedure: ARTHROPLASTY, KNEE, TOTAL;  Surgeon: Yvone Rush, MD;  Location: [facility];  Service: Orthopedics;  Laterality: Left;   Patient Active Problem List   Diagnosis Date Noted   Preoperative examination 03/13/2024   Onychomycosis 11/28/2022   Controlled type 2 diabetes mellitus with complication, without long-term current use of insulin  (HCC) 08/10/2020   Bilateral primary osteoarthritis of knee 05/09/2018   Degenerative arthritis of left knee 09/12/2017   MDD (major depressive disorder), recurrent severe, without psychosis (HCC) 11/03/2016   Poor dentition 06/26/2016   Hyperlipidemia 06/26/2016   Chronic liver disease 08/29/2015   Health care maintenance 12/31/2013   Alcohol use disorder, moderate, dependence (HCC)    Primary vulvar squamous cell carcinoma s/p resection 2011 02/13/2010   Postablative hypothyroidism 04/12/2006   TOBACCO ABUSE 04/12/2006    PCP: Kandis Perkins, DO  REFERRING PROVIDER: Yvone Rush HERO  REFERRING DIAG: M17.12 (ICD-10-CM) - Unilateral primary osteoarthritis, left knee   THERAPY DIAG:  No diagnosis found.  Rationale for Evaluation and Treatment: Rehabilitation  ONSET DATE: 04/27/2024  SUBJECTIVE:   SUBJECTIVE  STATEMENT: 07/15/2024 ***  Pt reports feeling pretty good today. She is going to get a cane today. She reported needing to leave by 11:10 am today.  Pt reports a L TKA on 04/27/2024. Prior to surgery, she was using an AD or was hopping around. Since the surgery, Yuvonne has been using a walker. She reports pain with most movement and has been performing exercises at home (ankle pumps). She saw her surgeon about 2 weeks ago who advised return in 1 month/4 weeks. Pt has not noticed any drainage from her incision. She denies fever and tenderness around calf.   PERTINENT HISTORY: Drug abuse, DM II, liver diease, MDD PAIN:  Are you having pain? Yes: NPRS scale: 7/10; 7/10 worst; 3/10 best Pain location: over incision Pain description: ache Aggravating factors: movement Relieving factors: ice  PRECAUTIONS: Knee  RED FLAGS: None   WEIGHT BEARING RESTRICTIONS: No  FALLS:  Has patient fallen in last 6 months? No  LIVING ENVIRONMENT: Lives with: lives with their family Lives in: House/apartment Stairs: Yes: External: 4 steps; on right going up Has following equipment at home: [name] - 2 wheeled and shower chair  OCCUPATION: n/a  PLOF: Needs assistance with ADLs and Needs assistance with gait  PATIENT GOALS: walk without device   NEXT MD VISIT: 07/09/2024  OBJECTIVE:  Note: Objective measures were completed at Evaluation unless otherwise noted.  PATIENT SURVEYS:  LEFS  Extreme difficulty/unable (0), Quite a bit of difficulty (1), Moderate difficulty (2), Little difficulty (3), No difficulty (4) Survey date:  05/21/2024   Any of your usual work, housework or school activities 0  2. Usual hobbies, recreational or sporting activities 1  3. Getting into/out of the bath 0  4. Walking between rooms 1  5. Putting on socks/shoes 2  6. Squatting  0  7. Lifting an object, like a bag of groceries from the floor 1  8. Performing light activities around your home 1  9. Performing heavy  activities around your home 0  10. Getting into/out of a car 1  11. Walking 2 blocks 1  12. Walking 1 mile 0  13. Going up/down 10 stairs (1 flight) 0  14. Standing for 1 hour 0  15.  sitting for 1 hour 2  16. Running on even ground 1  17. Running on uneven ground 1  18. Making sharp turns while running fast 0  19. Hopping  0  20. Rolling over in bed 2  Score total:  14/80     COGNITION: Overall cognitive status: Impaired    OBSERVATION:   Arrives with walker, stocking and bandage on   SENSATION: WFL  EDEMA:  Circumferential: deferred  PALPATION: Tenderness around incision   LOWER EXTREMITY ROM:   A/PROM Right eval Left eval  Hip flexion    Hip extension    Hip abduction    Hip adduction    Hip internal rotation    Hip external rotation    Knee flexion  90*; 95* PROM  Knee extension  -40*; -35* PROM  Ankle dorsiflexion    Ankle plantarflexion    Ankle inversion    Ankle eversion     (Blank rows = not tested)(* = pain)   LOWER EXTREMITY MMT: deferred this date due to limited ROM  MMT Right eval Left eval  Hip flexion    Hip extension    Hip abduction    Hip adduction    Hip internal rotation    Hip external rotation    Knee flexion    Knee extension    Ankle dorsiflexion    Ankle plantarflexion    Ankle inversion    Ankle eversion     (Blank rows = not tested)  FUNCTIONAL TESTS:  30 seconds chair stand test: 4.5 (ended standing) with UE support 2 minute walk test: 101 ft with walker  GAIT: Distance walked: 34ft total  Assistive device utilized: Walker - 2 wheeled Level of assistance: SBA Comments: slow gait with majority of support for walker                                                                                                                                 TREATMENT DATE:   [facility] Adult PT Treatment:                                                DATE: 07/15/2024   Therapeutic Activity:  Reassessment of objective measures  and subjective assessment regarding  progress towards established goals and updated plan for addressing remaining deficits and rehab goals.    [facility] Adult PT Treatment:                                                DATE: 06/17/2024  Therapeutic Exercise:  DKTC with ball x 20  Quad set with heel on 1/2 foam roll 10x5 NuStep level 5, LE only, x 8 mins  Manual:  PROM knee flexion and extension   [facility] Adult PT Treatment:                                                DATE: 05/21/2024   Therapeutic Exercise:  Hamstring stretch with pt OP to knee 2x30  Glute set 10x5   Manual:  PROM knee flexion and extension   PATIENT EDUCATION:  Education details: POC, HEP, diagnosis, prognosis. Person educated: Patient Education method: Explanation, Demonstration, Tactile cues, Verbal cues, and Handouts Education comprehension: verbalized understanding, returned demonstration, verbal cues required, tactile cues required, and needs further education  HOME EXERCISE PROGRAM: Access Code: [email or web address] URL: [email or web address] Date: 05/21/2024 Prepared by: Marijo Berber  Exercises - Supine Heel Slide with Strap  - 2-3 x daily - 7 x weekly - 2 sets - 10 reps - 10s hold - Seated Hamstring Stretch  - 2-3 x daily - 7 x weekly - 2 sets - 10 reps - 10s hold - Supine Gluteal Sets  - 2-3 x daily - 7 x weekly - 2 sets - 10 reps - 10s hold - Supine Quad Set  - 2-3 x daily - 7 x weekly - 2 sets - 10 reps - 10s hold  ASSESSMENT:  CLINICAL IMPRESSION: 07/15/2024 Patient has attended *** PT sessions to address *** following ***. She is demonstrating good improvement of ***. She continues to have difficulty with ***. She requires ongoing skilled PT intervention in order to address remaining deficits and progress towards functional rehab goals.   Continued with manual and ROM exercises with minimal improvement seen. Pt continues to lack about 20 degrees of knee extension and is mostly intolerable to  manual. The pt will benefit from skilled physical therapy to decrease pain and increase function.   Patient is a 63 y.o. female who was seen today for physical therapy evaluation and treatment for L TKA. She had surgery on 04/27/2024. Pt has been using a walker since surgery. She has poor knee mobility at 3 and a half weeks post op. Pt is not tolerable to PROM with hard end feels. The pt will benefit from skilled physical therapy to return decrease pain and increase function.    OBJECTIVE IMPAIRMENTS: Abnormal gait, decreased activity tolerance, decreased balance, decreased endurance, decreased mobility, difficulty walking, decreased ROM, decreased strength, hypomobility, increased edema, impaired flexibility, and pain.   ACTIVITY LIMITATIONS: lifting, bending, sitting, standing, squatting, sleeping, stairs, transfers, bed mobility, bathing, toileting, dressing, and locomotion level  PARTICIPATION LIMITATIONS: cleaning, laundry, driving, and shopping  PERSONAL FACTORS: Behavior pattern, Education, Fitness, Past/current experiences, Social background, Transportation, and 3+ comorbidities: Drug abuse, DM II, liver diease, MDD are also affecting patient's functional outcome.   REHAB POTENTIAL: Fair due to comorbidities   CLINICAL DECISION MAKING: Evolving/moderate complexity  EVALUATION COMPLEXITY:  Moderate   GOALS: Goals reviewed with patient? Yes  SHORT TERM GOALS: Target date: 06/11/2024 Pt will be compliant and independent with HEP to assist with symptom management/recovery at home.  Baseline: [id number] Goal status: INITIAL  2.  Pt will achieve a 10 degree increase in AROM knee extension to assist with walking.  Baseline: -45 Goal status: INITIAL  3.  Pt will achieve -10 degrees or greater knee extension PROM to assist with walking.  Baseline: -40 Goal status: INITIAL   LONG TERM GOALS: Target date: 07/02/2024 ***  Pt will achieve 100 degrees AROM knee flexion to assist with  stairs, dressing, and ADLs.  Baseline: 90 degrees  Goal status: INITIAL  2.  Pt will be able to walk with a SPC for 156ft.  Baseline: using walker  Goal status: INITIAL  3.  Pt will demonstrate 4/5 or greater strength of the knee to assist with ADLs.  Baseline: less than 3/5  Goal status: INITIAL  4.  Pt will be comfortable with her final HEP in order to continue any symptom management at home and to avoid regression.   Baseline: [id number] Goal status: INITIAL   PLAN:  PT FREQUENCY: 1-2x/week  PT DURATION: 6 weeks  PLANNED INTERVENTIONS: 97110-Therapeutic exercises, 97530- Therapeutic activity, 97112- Neuromuscular re-education, 97535- Self Care, 02859- Manual therapy, [phone]- Gait training, [phone]- Aquatic Therapy, [phone]- Vasopneumatic device, Patient/Family education, Balance training, Stair training, Joint mobilization, Scar mobilization, Cryotherapy, and Moist heat  PLAN FOR NEXT SESSION: knee ROM, knee strengthening, hip strengthening ***   Marko Molt, PT, DPT  07/15/2024 8:40 AM  "

## 2024-07-16 ENCOUNTER — Ambulatory Visit: Payer: MEDICAID

## 2024-07-16 DIAGNOSIS — G8929 Other chronic pain: Secondary | ICD-10-CM

## 2024-07-16 DIAGNOSIS — Z96652 Presence of left artificial knee joint: Secondary | ICD-10-CM

## 2024-07-16 DIAGNOSIS — R262 Difficulty in walking, not elsewhere classified: Secondary | ICD-10-CM | POA: Diagnosis present

## 2024-07-16 DIAGNOSIS — M25562 Pain in left knee: Secondary | ICD-10-CM | POA: Diagnosis present

## 2024-07-16 NOTE — Therapy (Signed)
 " OUTPATIENT PHYSICAL THERAPY NOTE RECERTIFICATION   Patient Name: [name] MRN: [id number] DOB:1961/03/10, 64 y.o., female Today's Date: 07/16/2024  END OF SESSION:  PT End of Session - 07/16/24 0917     Visit Number 6    Number of Visits 12    Date for Recertification  07/02/24    Authorization Type [employer]    Authorization Time Period auth reqd    PT Start Time 0917    PT Stop Time 0943    PT Time Calculation (min) 26 min    Activity Tolerance Patient limited by pain    Behavior During Therapy [facility] for tasks assessed/performed           Past Medical History:  Diagnosis Date   Arthritis    Cocaine abuse (HCC)    Depression    Diabetes mellitus without complication (HCC)    ETOH abuse    Hypothyroidism    Unspecified mood (affective) disorder    Past Surgical History:  Procedure Laterality Date   radioactive iodine  ablation     graves disease s/p   TOTAL KNEE ARTHROPLASTY Right 05/09/2018   Procedure: TOTAL KNEE ARTHROPLASTY;  Surgeon: Yvone Rush, MD;  Location: [facility];  Service: Orthopedics;  Laterality: Right;   TOTAL KNEE ARTHROPLASTY Left 04/27/2024   Procedure: ARTHROPLASTY, KNEE, TOTAL;  Surgeon: Yvone Rush, MD;  Location: [facility];  Service: Orthopedics;  Laterality: Left;   Patient Active Problem List   Diagnosis Date Noted   Preoperative examination 03/13/2024   Onychomycosis 11/28/2022   Controlled type 2 diabetes mellitus with complication, without long-term current use of insulin  (HCC) 08/10/2020   Bilateral primary osteoarthritis of knee 05/09/2018   Degenerative arthritis of left knee 09/12/2017   MDD (major depressive disorder), recurrent severe, without psychosis (HCC) 11/03/2016   Poor dentition 06/26/2016   Hyperlipidemia 06/26/2016   Chronic liver disease 08/29/2015   Health care maintenance 12/31/2013   Alcohol use disorder, moderate, dependence (HCC)    Primary vulvar squamous cell carcinoma s/p resection 2011 02/13/2010    Postablative hypothyroidism 04/12/2006   TOBACCO ABUSE 04/12/2006    PCP: Kandis Perkins, DO  REFERRING PROVIDER: Yvone Rush HERO  REFERRING DIAG: M17.12 (ICD-10-CM) - Unilateral primary osteoarthritis, left knee   THERAPY DIAG:  Status post total knee replacement, left  Chronic pain of left knee  Difficulty in walking, not elsewhere classified  Rationale for Evaluation and Treatment: Rehabilitation  ONSET DATE: 04/27/2024  SUBJECTIVE:   SUBJECTIVE STATEMENT: 07/16/2024 Patient reports that since starting PT she has made a little bit of improvement. She notices that she still walks with her L leg turned out. She is using SPC and RW. Reporting she needs to leave by 945 today d/t transportation. She was unable to attend yesterday's visit because transportation never came to pick her up.  She does have pain with standing for awhile. She states that she has completed HEP 3x since last visit which was 1 month ago.   EVAL: Pt reports a L TKA on 04/27/2024. Prior to surgery, she was using an AD or was hopping around. Since the surgery, Kennie has been using a walker. She reports pain with most movement and has been performing exercises at home (ankle pumps). She saw her surgeon about 2 weeks ago who advised return in 1 month/4 weeks. Pt has not noticed any drainage from her incision. She denies fever and tenderness around calf.   PERTINENT HISTORY: Drug abuse, DM II, liver diease, MDD PAIN:  Are you  having pain? Yes: NPRS scale: 7/10; 7/10 worst; 3/10 best Pain location: over incision Pain description: ache Aggravating factors: movement Relieving factors: ice  PRECAUTIONS: Knee  RED FLAGS: None   WEIGHT BEARING RESTRICTIONS: No  FALLS:  Has patient fallen in last 6 months? No  LIVING ENVIRONMENT: Lives with: lives with their family Lives in: House/apartment Stairs: Yes: External: 4 steps; on right going up Has following equipment at home: [name] - 2 wheeled and shower  chair  OCCUPATION: n/a  PLOF: Needs assistance with ADLs and Needs assistance with gait  PATIENT GOALS: walk without device   NEXT MD VISIT: 07/09/2024  OBJECTIVE:  Note: Objective measures were completed at Evaluation unless otherwise noted.  PATIENT SURVEYS:  LEFS  Extreme difficulty/unable (0), Quite a bit of difficulty (1), Moderate difficulty (2), Little difficulty (3), No difficulty (4) Survey date:  05/21/2024   Any of your usual work, housework or school activities 0  2. Usual hobbies, recreational or sporting activities 1  3. Getting into/out of the bath 0  4. Walking between rooms 1  5. Putting on socks/shoes 2  6. Squatting  0  7. Lifting an object, like a bag of groceries from the floor 1  8. Performing light activities around your home 1  9. Performing heavy activities around your home 0  10. Getting into/out of a car 1  11. Walking 2 blocks 1  12. Walking 1 mile 0  13. Going up/down 10 stairs (1 flight) 0  14. Standing for 1 hour 0  15.  sitting for 1 hour 2  16. Running on even ground 1  17. Running on uneven ground 1  18. Making sharp turns while running fast 0  19. Hopping  0  20. Rolling over in bed 2  Score total:  14/80     COGNITION: Overall cognitive status: Impaired    OBSERVATION:   Arrives with walker, stocking and bandage on   SENSATION: WFL  EDEMA:  Circumferential: deferred  PALPATION: Tenderness around incision   LOWER EXTREMITY ROM:   A/PROM Right eval Left eval Left 07/16/2024   Hip flexion     Hip extension     Hip abduction     Hip adduction     Hip internal rotation     Hip external rotation     Knee flexion  90*; 95* PROM 95 AROM  Knee extension  -40*; -35* PROM -20 PROM  Ankle dorsiflexion     Ankle plantarflexion     Ankle inversion     Ankle eversion      (Blank rows = not tested)(* = pain)   LOWER EXTREMITY MMT: deferred this date due to limited ROM  MMT Right eval Left eval  Hip flexion    Hip  extension    Hip abduction    Hip adduction    Hip internal rotation    Hip external rotation    Knee flexion    Knee extension    Ankle dorsiflexion    Ankle plantarflexion    Ankle inversion    Ankle eversion     (Blank rows = not tested)  FUNCTIONAL TESTS:  30 seconds chair stand test: 4.5 (ended standing) with UE support 2 minute walk test: 101 ft with walker  GAIT: Distance walked: 46ft total  Assistive device utilized: Walker - 2 wheeled Level of assistance: SBA Comments: slow gait with majority of support for walker  TREATMENT DATE:   [facility] Adult PT Treatment:                                                DATE: 07/16/2024  Therapeutic Activity:  Reassessment of objective measures and subjective assessment regarding progress towards established goals and updated plan for addressing remaining deficits and rehab goals.  Updated and reviewed HEP with patient education to stress importance of daily HEP adherence   [facility] Adult PT Treatment:                                                DATE: 06/17/2024  Therapeutic Exercise:  DKTC with ball x 20  Quad set with heel on 1/2 foam roll 10x5 NuStep level 5, LE only, x 8 mins  Manual:  PROM knee flexion and extension   [facility] Adult PT Treatment:                                                DATE: 05/21/2024   Therapeutic Exercise:  Hamstring stretch with pt OP to knee 2x30  Glute set 10x5   Manual:  PROM knee flexion and extension   PATIENT EDUCATION:  Education details: POC, HEP, diagnosis, prognosis. Person educated: Patient Education method: Explanation, Demonstration, Tactile cues, Verbal cues, and Handouts Education comprehension: verbalized understanding, returned demonstration, verbal cues required, tactile cues required, and needs further education  HOME EXERCISE PROGRAM: Access  Code: [email or web address] URL: [email or web address] Date: 07/16/2024 Prepared by: Marko Molt  Exercises - Seated Heel Slides With Towel  - 1 x daily - 7 x weekly - 2 sets - 10 reps - 3s hold - Seated Hamstring Stretch  - 2 x daily - 7 x weekly - 3 sets - 10-15s hold - Sit to Stand with Counter Support  - 1 x daily - 7 x weekly - 2 sets - 8 reps - Supine Gluteal Sets  - 2 x daily - 7 x weekly - 2 sets - 8 reps - 5s hold - Supine Quad Set  - 2 x daily - 7 x weekly - 2 sets - 8 reps - 5s hold  ASSESSMENT:  CLINICAL IMPRESSION: 07/16/2024 Patient has attended 6 PT sessions to address L knee ROM, strength and gait deficits following L TKA. At this time she is 11 weeks post op and has significant knee extension lag, diminished knee flexion AROM, and gait deficits requires LRAD. She is demonstrating some improvement of knee extension ROM from -30deg PROM to -20 AROM. She continues to ambulate with excessive knee flexion and out toeing during L stance phase. She requires ongoing skilled PT intervention in order to address remaining deficits and progress towards functional rehab goals. Recommended for patient to continue with PT 2x/week for 6 more weeks.    Patient is a 64 y.o. female who was seen today for physical therapy evaluation and treatment for L TKA. She had surgery on 04/27/2024. Pt has been using a walker since surgery. She has poor knee mobility at 3 and a half weeks post op. Pt is not tolerable to PROM with hard  end feels. The pt will benefit from skilled physical therapy to return decrease pain and increase function.    OBJECTIVE IMPAIRMENTS: Abnormal gait, decreased activity tolerance, decreased balance, decreased endurance, decreased mobility, difficulty walking, decreased ROM, decreased strength, hypomobility, increased edema, impaired flexibility, and pain.   ACTIVITY LIMITATIONS: lifting, bending, sitting, standing, squatting, sleeping, stairs, transfers, bed mobility, bathing,  toileting, dressing, and locomotion level  PARTICIPATION LIMITATIONS: cleaning, laundry, driving, and shopping  PERSONAL FACTORS: Behavior pattern, Education, Fitness, Past/current experiences, Social background, Transportation, and 3+ comorbidities: Drug abuse, DM II, liver diease, MDD are also affecting patient's functional outcome.   REHAB POTENTIAL: Fair due to comorbidities   CLINICAL DECISION MAKING: Evolving/moderate complexity  EVALUATION COMPLEXITY: Moderate   GOALS: Goals reviewed with patient? Yes  SHORT TERM GOALS: Target date: 06/11/2024 Pt will be compliant and independent with HEP to assist with symptom management/recovery at home.  Baseline: [id number] Goal status: ONGOING  2.  Pt will achieve a 10 degree increase in AROM knee extension to assist with walking.  Baseline: -45 Goal status: MET  3.  Pt will achieve -10 degrees or greater knee extension PROM to assist with walking.  Baseline: -40 Goal status: MET   LONG TERM GOALS: Target date: 08/27/2024, last updated 07/16/2024    Pt will achieve 100 degrees AROM knee flexion to assist with stairs, dressing, and ADLs.  Baseline: 90 degrees  Goal status: ONGOING  2.  Pt will be able to walk with a SPC for 133ft.  Baseline: using walker  Goal status:MET  3.  Pt will demonstrate 4/5 or greater strength of the knee to assist with ADLs.  Baseline: less than 3/5  Goal status: ONGOING  4.  Pt will be comfortable with her final HEP in order to continue any symptom management at home and to avoid regression.   Baseline: [id number] Goal status: ONGOING   PLAN:  PT FREQUENCY: 2x/week  PT DURATION: 6 weeks last updated 07/16/2024  PLANNED INTERVENTIONS: 97110-Therapeutic exercises, 97530- Therapeutic activity, 97112- Neuromuscular re-education, 97535- Self Care, 02859- Manual therapy, [phone]- Gait training, [phone]- Aquatic Therapy, [phone]- Vasopneumatic device, Patient/Family education, Balance training, Stair  training, Joint mobilization, Scar mobilization, Cryotherapy, and Moist heat  For all possible CPT codes, reference the Planned Interventions line above.     Check all conditions that are expected to impact treatment: {Conditions expected to impact treatment:Diabetes mellitus, Psychological or psychiatric disorders, Social determinants of health, and Active major medical illness      PLAN FOR NEXT SESSION: address knee ROM, knee strengthening, hip strengthening, gait mechanics    Marko Molt, PT, DPT  07/16/2024 9:54 AM  "

## 2024-07-22 ENCOUNTER — Ambulatory Visit: Payer: MEDICAID

## 2024-07-22 NOTE — Therapy (Incomplete)
 " OUTPATIENT PHYSICAL THERAPY NOTE RECERTIFICATION   Patient Name: [name] MRN: [id number] DOB:05-20-1961, 64 y.o., female Today's Date: 07/22/2024  END OF SESSION:     Past Medical History:  Diagnosis Date   Arthritis    Cocaine abuse (HCC)    Depression    Diabetes mellitus without complication (HCC)    ETOH abuse    Hypothyroidism    Unspecified mood (affective) disorder    Past Surgical History:  Procedure Laterality Date   radioactive iodine  ablation     graves disease s/p   TOTAL KNEE ARTHROPLASTY Right 05/09/2018   Procedure: TOTAL KNEE ARTHROPLASTY;  Surgeon: Yvone Rush, MD;  Location: [facility];  Service: Orthopedics;  Laterality: Right;   TOTAL KNEE ARTHROPLASTY Left 04/27/2024   Procedure: ARTHROPLASTY, KNEE, TOTAL;  Surgeon: Yvone Rush, MD;  Location: [facility];  Service: Orthopedics;  Laterality: Left;   Patient Active Problem List   Diagnosis Date Noted   Preoperative examination 03/13/2024   Onychomycosis 11/28/2022   Controlled type 2 diabetes mellitus with complication, without long-term current use of insulin  (HCC) 08/10/2020   Bilateral primary osteoarthritis of knee 05/09/2018   Degenerative arthritis of left knee 09/12/2017   MDD (major depressive disorder), recurrent severe, without psychosis (HCC) 11/03/2016   Poor dentition 06/26/2016   Hyperlipidemia 06/26/2016   Chronic liver disease 08/29/2015   Health care maintenance 12/31/2013   Alcohol use disorder, moderate, dependence (HCC)    Primary vulvar squamous cell carcinoma s/p resection 2011 02/13/2010   Postablative hypothyroidism 04/12/2006   TOBACCO ABUSE 04/12/2006    PCP: Kandis Perkins, DO  REFERRING PROVIDER: Yvone Rush HERO  REFERRING DIAG: M17.12 (ICD-10-CM) - Unilateral primary osteoarthritis, left knee   THERAPY DIAG:  No diagnosis found.  Rationale for Evaluation and Treatment: Rehabilitation  ONSET DATE: 04/27/2024  SUBJECTIVE:   SUBJECTIVE  STATEMENT: 07/22/2024 *** Patient reports that since starting PT she has made a little bit of improvement. She notices that she still walks with her L leg turned out. She is using SPC and RW. Reporting she needs to leave by 945 today d/t transportation. She was unable to attend yesterday's visit because transportation never came to pick her up.  She does have pain with standing for awhile. She states that she has completed HEP 3x since last visit which was 1 month ago.   EVAL: Pt reports a L TKA on 04/27/2024. Prior to surgery, she was using an AD or was hopping around. Since the surgery, Haasini has been using a walker. She reports pain with most movement and has been performing exercises at home (ankle pumps). She saw her surgeon about 2 weeks ago who advised return in 1 month/4 weeks. Pt has not noticed any drainage from her incision. She denies fever and tenderness around calf.   PERTINENT HISTORY: Drug abuse, DM II, liver diease, MDD PAIN:  Are you having pain? Yes: NPRS scale: 7/10; 7/10 worst; 3/10 best Pain location: over incision Pain description: ache Aggravating factors: movement Relieving factors: ice  PRECAUTIONS: Knee  RED FLAGS: None   WEIGHT BEARING RESTRICTIONS: No  FALLS:  Has patient fallen in last 6 months? No  LIVING ENVIRONMENT: Lives with: lives with their family Lives in: House/apartment Stairs: Yes: External: 4 steps; on right going up Has following equipment at home: [name] - 2 wheeled and shower chair  OCCUPATION: n/a  PLOF: Needs assistance with ADLs and Needs assistance with gait  PATIENT GOALS: walk without device   NEXT MD VISIT: 07/09/2024  OBJECTIVE:  Note: Objective measures were completed at Evaluation unless otherwise noted.  PATIENT SURVEYS:  LEFS  Extreme difficulty/unable (0), Quite a bit of difficulty (1), Moderate difficulty (2), Little difficulty (3), No difficulty (4) Survey date:  05/21/2024   Any of your usual work, housework  or school activities 0  2. Usual hobbies, recreational or sporting activities 1  3. Getting into/out of the bath 0  4. Walking between rooms 1  5. Putting on socks/shoes 2  6. Squatting  0  7. Lifting an object, like a bag of groceries from the floor 1  8. Performing light activities around your home 1  9. Performing heavy activities around your home 0  10. Getting into/out of a car 1  11. Walking 2 blocks 1  12. Walking 1 mile 0  13. Going up/down 10 stairs (1 flight) 0  14. Standing for 1 hour 0  15.  sitting for 1 hour 2  16. Running on even ground 1  17. Running on uneven ground 1  18. Making sharp turns while running fast 0  19. Hopping  0  20. Rolling over in bed 2  Score total:  14/80     COGNITION: Overall cognitive status: Impaired    OBSERVATION:   Arrives with walker, stocking and bandage on   SENSATION: WFL  EDEMA:  Circumferential: deferred  PALPATION: Tenderness around incision   LOWER EXTREMITY ROM:   A/PROM Right eval Left eval Left 07/16/2024   Hip flexion     Hip extension     Hip abduction     Hip adduction     Hip internal rotation     Hip external rotation     Knee flexion  90*; 95* PROM 95 AROM  Knee extension  -40*; -35* PROM -20 PROM  Ankle dorsiflexion     Ankle plantarflexion     Ankle inversion     Ankle eversion      (Blank rows = not tested)(* = pain)   LOWER EXTREMITY MMT: deferred this date due to limited ROM  MMT Right eval Left eval  Hip flexion    Hip extension    Hip abduction    Hip adduction    Hip internal rotation    Hip external rotation    Knee flexion    Knee extension    Ankle dorsiflexion    Ankle plantarflexion    Ankle inversion    Ankle eversion     (Blank rows = not tested)  FUNCTIONAL TESTS:  30 seconds chair stand test: 4.5 (ended standing) with UE support 2 minute walk test: 101 ft with walker  GAIT: Distance walked: 43ft total  Assistive device utilized: Walker - 2 wheeled Level of  assistance: SBA Comments: slow gait with majority of support for walker  TREATMENT DATE:   [facility] Adult PT Treatment:                                                DATE: 07/22/2024  Therapeutic Exercise:  NuStep level 5, LE only, x 8 mins DKTC with ball x 20  Quad set with heel on 1/2 foam roll 10x5 FOCUS ON ROM D/T REMAINING EXTENSION LAG  Manual:  PROM knee flexion and extension    [facility] Adult PT Treatment:                                                DATE: 07/16/2024  Therapeutic Activity:  Reassessment of objective measures and subjective assessment regarding progress towards established goals and updated plan for addressing remaining deficits and rehab goals.  Updated and reviewed HEP with patient education to stress importance of daily HEP adherence   [facility] Adult PT Treatment:                                                DATE: 06/17/2024  Therapeutic Exercise:  DKTC with ball x 20  Quad set with heel on 1/2 foam roll 10x5 NuStep level 5, LE only, x 8 mins  Manual:  PROM knee flexion and extension   [facility] Adult PT Treatment:                                                DATE: 05/21/2024   Therapeutic Exercise:  Hamstring stretch with pt OP to knee 2x30  Glute set 10x5   Manual:  PROM knee flexion and extension   PATIENT EDUCATION:  Education details: POC, HEP, diagnosis, prognosis. Person educated: Patient Education method: Explanation, Demonstration, Tactile cues, Verbal cues, and Handouts Education comprehension: verbalized understanding, returned demonstration, verbal cues required, tactile cues required, and needs further education  HOME EXERCISE PROGRAM: Access Code: [email or web address] URL: [email or web address] Date: 07/16/2024 Prepared by: Marko Molt  Exercises - Seated Heel Slides With Towel  - 1 x daily - 7 x  weekly - 2 sets - 10 reps - 3s hold - Seated Hamstring Stretch  - 2 x daily - 7 x weekly - 3 sets - 10-15s hold - Sit to Stand with Counter Support  - 1 x daily - 7 x weekly - 2 sets - 8 reps - Supine Gluteal Sets  - 2 x daily - 7 x weekly - 2 sets - 8 reps - 5s hold - Supine Quad Set  - 2 x daily - 7 x weekly - 2 sets - 8 reps - 5s hold  ASSESSMENT:  CLINICAL IMPRESSION: 07/22/2024 *** Patient has attended 6 PT sessions to address L knee ROM, strength and gait deficits following L TKA. At this time she is 11 weeks post op and has significant knee extension lag, diminished knee flexion AROM, and gait deficits requires LRAD. She is demonstrating some improvement of knee extension ROM from -30deg PROM to -20 AROM. She continues  to ambulate with excessive knee flexion and out toeing during L stance phase. She requires ongoing skilled PT intervention in order to address remaining deficits and progress towards functional rehab goals. Recommended for patient to continue with PT 2x/week for 6 more weeks.    Patient is a 64 y.o. female who was seen today for physical therapy evaluation and treatment for L TKA. She had surgery on 04/27/2024. Pt has been using a walker since surgery. She has poor knee mobility at 3 and a half weeks post op. Pt is not tolerable to PROM with hard end feels. The pt will benefit from skilled physical therapy to return decrease pain and increase function.    OBJECTIVE IMPAIRMENTS: Abnormal gait, decreased activity tolerance, decreased balance, decreased endurance, decreased mobility, difficulty walking, decreased ROM, decreased strength, hypomobility, increased edema, impaired flexibility, and pain.   ACTIVITY LIMITATIONS: lifting, bending, sitting, standing, squatting, sleeping, stairs, transfers, bed mobility, bathing, toileting, dressing, and locomotion level  PARTICIPATION LIMITATIONS: cleaning, laundry, driving, and shopping  PERSONAL FACTORS: Behavior pattern, Education,  Fitness, Past/current experiences, Social background, Transportation, and 3+ comorbidities: Drug abuse, DM II, liver diease, MDD are also affecting patient's functional outcome.   REHAB POTENTIAL: Fair due to comorbidities   CLINICAL DECISION MAKING: Evolving/moderate complexity  EVALUATION COMPLEXITY: Moderate   GOALS: Goals reviewed with patient? Yes  SHORT TERM GOALS: Target date: 06/11/2024 Pt will be compliant and independent with HEP to assist with symptom management/recovery at home.  Baseline: [id number] Goal status: ONGOING  2.  Pt will achieve a 10 degree increase in AROM knee extension to assist with walking.  Baseline: -45 Goal status: MET  3.  Pt will achieve -10 degrees or greater knee extension PROM to assist with walking.  Baseline: -40 Goal status: MET   LONG TERM GOALS: Target date: 08/27/2024, last updated 07/16/2024    Pt will achieve 100 degrees AROM knee flexion to assist with stairs, dressing, and ADLs.  Baseline: 90 degrees  Goal status: ONGOING  2.  Pt will be able to walk with a SPC for 173ft.  Baseline: using walker  Goal status:MET  3.  Pt will demonstrate 4/5 or greater strength of the knee to assist with ADLs.  Baseline: less than 3/5  Goal status: ONGOING  4.  Pt will be comfortable with her final HEP in order to continue any symptom management at home and to avoid regression.   Baseline: [id number] Goal status: ONGOING   PLAN:  PT FREQUENCY: 2x/week  PT DURATION: 6 weeks last updated 07/16/2024  PLANNED INTERVENTIONS: 97110-Therapeutic exercises, 97530- Therapeutic activity, 97112- Neuromuscular re-education, 97535- Self Care, 02859- Manual therapy, [phone]- Gait training, [phone]- Aquatic Therapy, [phone]- Vasopneumatic device, Patient/Family education, Balance training, Stair training, Joint mobilization, Scar mobilization, Cryotherapy, and Moist heat  For all possible CPT codes, reference the Planned Interventions line above.     Check  all conditions that are expected to impact treatment: {Conditions expected to impact treatment:Diabetes mellitus, Psychological or psychiatric disorders, Social determinants of health, and Active major medical illness      PLAN FOR NEXT SESSION: address knee ROM, knee strengthening, hip strengthening, gait mechanics    Marko Molt, PT, DPT  07/22/2024 8:30 AM  "

## 2024-07-23 ENCOUNTER — Ambulatory Visit: Payer: MEDICAID

## 2024-07-23 DIAGNOSIS — G8929 Other chronic pain: Secondary | ICD-10-CM

## 2024-07-23 DIAGNOSIS — M25562 Pain in left knee: Secondary | ICD-10-CM | POA: Diagnosis not present

## 2024-07-23 DIAGNOSIS — R262 Difficulty in walking, not elsewhere classified: Secondary | ICD-10-CM

## 2024-07-23 DIAGNOSIS — Z96652 Presence of left artificial knee joint: Secondary | ICD-10-CM

## 2024-07-23 NOTE — Therapy (Signed)
 " OUTPATIENT PHYSICAL THERAPY NOTE   Patient Name: [name] MRN: [id number] DOB:06-12-1961, 64 y.o., female Today's Date: 07/23/2024  END OF SESSION:  PT End of Session - 07/23/24 1054     Visit Number 7    Number of Visits 12    Date for Recertification  07/02/24    Authorization Type [employer]    Authorization Time Period auth reqd    PT Start Time 1050    PT Stop Time 1128    PT Time Calculation (min) 38 min    Activity Tolerance Patient limited by pain    Behavior During Therapy WFL for tasks assessed/performed            Past Medical History:  Diagnosis Date   Arthritis    Cocaine abuse (HCC)    Depression    Diabetes mellitus without complication (HCC)    ETOH abuse    Hypothyroidism    Unspecified mood (affective) disorder    Past Surgical History:  Procedure Laterality Date   radioactive iodine  ablation     graves disease s/p   TOTAL KNEE ARTHROPLASTY Right 05/09/2018   Procedure: TOTAL KNEE ARTHROPLASTY;  Surgeon: Yvone Rush, MD;  Location: [facility];  Service: Orthopedics;  Laterality: Right;   TOTAL KNEE ARTHROPLASTY Left 04/27/2024   Procedure: ARTHROPLASTY, KNEE, TOTAL;  Surgeon: Yvone Rush, MD;  Location: [facility];  Service: Orthopedics;  Laterality: Left;   Patient Active Problem List   Diagnosis Date Noted   Preoperative examination 03/13/2024   Onychomycosis 11/28/2022   Controlled type 2 diabetes mellitus with complication, without long-term current use of insulin  (HCC) 08/10/2020   Bilateral primary osteoarthritis of knee 05/09/2018   Degenerative arthritis of left knee 09/12/2017   MDD (major depressive disorder), recurrent severe, without psychosis (HCC) 11/03/2016   Poor dentition 06/26/2016   Hyperlipidemia 06/26/2016   Chronic liver disease 08/29/2015   Health care maintenance 12/31/2013   Alcohol use disorder, moderate, dependence (HCC)    Primary vulvar squamous cell carcinoma s/p resection 2011 02/13/2010   Postablative  hypothyroidism 04/12/2006   TOBACCO ABUSE 04/12/2006    PCP: Kandis Perkins, DO  REFERRING PROVIDER: Yvone Rush HERO  REFERRING DIAG: M17.12 (ICD-10-CM) - Unilateral primary osteoarthritis, left knee   THERAPY DIAG:  Chronic pain of left knee  Difficulty in walking, not elsewhere classified  Status post total knee replacement, left  Rationale for Evaluation and Treatment: Rehabilitation  ONSET DATE: 04/27/2024  SUBJECTIVE:   SUBJECTIVE STATEMENT: 07/23/2024 no new complaints since re certification visit last week. She was unable to attend yesterday's visit because transportation services did not arrive on time.   RECERT: Patient reports that since starting PT she has made a little bit of improvement. She notices that she still walks with her L leg turned out. She is using SPC and RW. Reporting she needs to leave by 945 today d/t transportation. She was unable to attend yesterday's visit because transportation never came to pick her up.  She does have pain with standing for awhile. She states that she has completed HEP 3x since last visit which was 1 month ago.   EVAL: Pt reports a L TKA on 04/27/2024. Prior to surgery, she was using an AD or was hopping around. Since the surgery, Angelo has been using a walker. She reports pain with most movement and has been performing exercises at home (ankle pumps). She saw her surgeon about 2 weeks ago who advised return in 1 month/4 weeks. Pt has not noticed  any drainage from her incision. She denies fever and tenderness around calf.   PERTINENT HISTORY: Drug abuse, DM II, liver diease, MDD PAIN:  Are you having pain? Yes: NPRS scale: 7/10; 7/10 worst; 3/10 best Pain location: over incision Pain description: ache Aggravating factors: movement Relieving factors: ice  PRECAUTIONS: Knee  RED FLAGS: None   WEIGHT BEARING RESTRICTIONS: No  FALLS:  Has patient fallen in last 6 months? No  LIVING ENVIRONMENT: Lives with: lives with their  family Lives in: House/apartment Stairs: Yes: External: 4 steps; on right going up Has following equipment at home: [name] - 2 wheeled and shower chair  OCCUPATION: n/a  PLOF: Needs assistance with ADLs and Needs assistance with gait  PATIENT GOALS: walk without device   NEXT MD VISIT: 07/09/2024  OBJECTIVE:  Note: Objective measures were completed at Evaluation unless otherwise noted.  PATIENT SURVEYS:  LEFS   Extreme difficulty/unable (0), Quite a bit of difficulty (1), Moderate difficulty (2), Little difficulty (3), No difficulty (4) Survey date:  05/21/2024   Any of your usual work, housework or school activities 0  2. Usual hobbies, recreational or sporting activities 1  3. Getting into/out of the bath 0  4. Walking between rooms 1  5. Putting on socks/shoes 2  6. Squatting  0  7. Lifting an object, like a bag of groceries from the floor 1  8. Performing light activities around your home 1  9. Performing heavy activities around your home 0  10. Getting into/out of a car 1  11. Walking 2 blocks 1  12. Walking 1 mile 0  13. Going up/down 10 stairs (1 flight) 0  14. Standing for 1 hour 0  15.  sitting for 1 hour 2  16. Running on even ground 1  17. Running on uneven ground 1  18. Making sharp turns while running fast 0  19. Hopping  0  20. Rolling over in bed 2  Score total:  14/80     COGNITION: Overall cognitive status: Impaired    OBSERVATION:   Arrives with walker, stocking and bandage on   SENSATION: WFL  EDEMA:  Circumferential: deferred  PALPATION: Tenderness around incision   LOWER EXTREMITY ROM:   A/PROM Right eval Left eval Left 07/16/2024   Hip flexion     Hip extension     Hip abduction     Hip adduction     Hip internal rotation     Hip external rotation     Knee flexion  90*; 95* PROM 95 AROM  Knee extension  -40*; -35* PROM -20 PROM  Ankle dorsiflexion     Ankle plantarflexion     Ankle inversion     Ankle eversion       (Blank rows = not tested)(* = pain)   LOWER EXTREMITY MMT: deferred this date due to limited ROM  MMT Right eval Left eval  Hip flexion    Hip extension    Hip abduction    Hip adduction    Hip internal rotation    Hip external rotation    Knee flexion    Knee extension    Ankle dorsiflexion    Ankle plantarflexion    Ankle inversion    Ankle eversion     (Blank rows = not tested)  FUNCTIONAL TESTS:  30 seconds chair stand test: 4.5 (ended standing) with UE support 2 minute walk test: 101 ft with walker  GAIT: Distance walked: 74ft total  Assistive device utilized: [employer] -  2 wheeled Level of assistance: SBA Comments: slow gait with majority of support for walker                                                                                                                                 TREATMENT DATE:   [facility] Adult PT Treatment:                                                DATE: 07/22/2024  Therapeutic Exercise:  Recumbent bike at self-selected pace x 5 minutes  Unable to maintain speed required to keep machine/screen on  SAQ 2 x 10, clinician OP  Heel slides on sliding board 2 x 10 with stretch strap for OP  Quad set with heel on 1/2 foam roll 10x5 LAQ x 10  Manual:  PROM knee flexion and extension    [facility] Adult PT Treatment:                                                DATE: 07/16/2024  Therapeutic Activity:  Reassessment of objective measures and subjective assessment regarding progress towards established goals and updated plan for addressing remaining deficits and rehab goals.  Updated and reviewed HEP with patient education to stress importance of daily HEP adherence   [facility] Adult PT Treatment:                                                DATE: 06/17/2024  Therapeutic Exercise:  DKTC with ball x 20  Quad set with heel on 1/2 foam roll 10x5 NuStep level 5, LE only, x 8 mins  Manual:  PROM knee flexion and extension   [facility] Adult PT  Treatment:                                                DATE: 05/21/2024   Therapeutic Exercise:  Hamstring stretch with pt OP to knee 2x30  Glute set 10x5   Manual:  PROM knee flexion and extension   PATIENT EDUCATION:  Education details: POC, HEP, diagnosis, prognosis. Person educated: Patient Education method: Explanation, Demonstration, Tactile cues, Verbal cues, and Handouts Education comprehension: verbalized understanding, returned demonstration, verbal cues required, tactile cues required, and needs further education  HOME EXERCISE PROGRAM: Access Code: [email or web address] URL: [email or web address] Date: 07/16/2024 Prepared by: Marko Molt  Exercises - Seated Heel Slides With Towel  - 1 x  daily - 7 x weekly - 2 sets - 10 reps - 3s hold - Seated Hamstring Stretch  - 2 x daily - 7 x weekly - 3 sets - 10-15s hold - Sit to Stand with Counter Support  - 1 x daily - 7 x weekly - 2 sets - 8 reps - Supine Gluteal Sets  - 2 x daily - 7 x weekly - 2 sets - 8 reps - 5s hold - Supine Quad Set  - 2 x daily - 7 x weekly - 2 sets - 8 reps - 5s hold  ASSESSMENT:  CLINICAL IMPRESSION: 07/23/2024 Today's session was focused on L knee flexion and extension ROM. She continues to have significant extension lag, but has improved ROM with AROM and AAROM activities. We will continue to progress as appropriate.    Patient has attended 6 PT sessions to address L knee ROM, strength and gait deficits following L TKA. At this time she is 11 weeks post op and has significant knee extension lag, diminished knee flexion AROM, and gait deficits requires LRAD. She is demonstrating some improvement of knee extension ROM from -30deg PROM to -20 AROM. She continues to ambulate with excessive knee flexion and out toeing during L stance phase. She requires ongoing skilled PT intervention in order to address remaining deficits and progress towards functional rehab goals. Recommended for patient to continue  with PT 2x/week for 6 more weeks.    Patient is a 64 y.o. female who was seen today for physical therapy evaluation and treatment for L TKA. She had surgery on 04/27/2024. Pt has been using a walker since surgery. She has poor knee mobility at 3 and a half weeks post op. Pt is not tolerable to PROM with hard end feels. The pt will benefit from skilled physical therapy to return decrease pain and increase function.    OBJECTIVE IMPAIRMENTS: Abnormal gait, decreased activity tolerance, decreased balance, decreased endurance, decreased mobility, difficulty walking, decreased ROM, decreased strength, hypomobility, increased edema, impaired flexibility, and pain.   ACTIVITY LIMITATIONS: lifting, bending, sitting, standing, squatting, sleeping, stairs, transfers, bed mobility, bathing, toileting, dressing, and locomotion level  PARTICIPATION LIMITATIONS: cleaning, laundry, driving, and shopping  PERSONAL FACTORS: Behavior pattern, Education, Fitness, Past/current experiences, Social background, Transportation, and 3+ comorbidities: Drug abuse, DM II, liver diease, MDD are also affecting patient's functional outcome.   REHAB POTENTIAL: Fair due to comorbidities   CLINICAL DECISION MAKING: Evolving/moderate complexity  EVALUATION COMPLEXITY: Moderate   GOALS: Goals reviewed with patient? Yes  SHORT TERM GOALS: Target date: 06/11/2024  Pt will be compliant and independent with HEP to assist with symptom management/recovery at home.  Baseline: [id number] Goal status: ONGOING  2.  Pt will achieve a 10 degree increase in AROM knee extension to assist with walking.  Baseline: -45 Goal status: MET  3.  Pt will achieve -10 degrees or greater knee extension PROM to assist with walking.  Baseline: -40 Goal status: MET   LONG TERM GOALS: Target date: 08/27/2024, last updated 07/16/2024   Pt will achieve 100 degrees AROM knee flexion to assist with stairs, dressing, and ADLs.  Baseline: 90 degrees   Goal status: ONGOING  2.  Pt will be able to walk with a SPC for 17ft.  Baseline: using walker  Goal status:MET  3.  Pt will demonstrate 4/5 or greater strength of the knee to assist with ADLs.  Baseline: less than 3/5  Goal status: ONGOING  4.  Pt will be comfortable with her final  HEP in order to continue any symptom management at home and to avoid regression.   Baseline: [id number] Goal status: ONGOING   PLAN:  PT FREQUENCY: 2x/week  PT DURATION: 6 weeks last updated 07/16/2024  PLANNED INTERVENTIONS: 97110-Therapeutic exercises, 97530- Therapeutic activity, 97112- Neuromuscular re-education, 97535- Self Care, [id number]- Manual therapy, [phone]- Gait training, [phone]- Aquatic Therapy, [phone]- Vasopneumatic device, Patient/Family education, Balance training, Stair training, Joint mobilization, Scar mobilization, Cryotherapy, and Moist heat  For all possible CPT codes, reference the Planned Interventions line above.     Check all conditions that are expected to impact treatment: {Conditions expected to impact treatment:Diabetes mellitus, Psychological or psychiatric disorders, Social determinants of health, and Active major medical illness      PLAN FOR NEXT SESSION: address knee ROM, knee strengthening, hip strengthening, gait mechanics    Marko Molt, PT, DPT  07/23/2024 12:31 PM  "

## 2024-07-29 ENCOUNTER — Ambulatory Visit: Payer: MEDICAID

## 2024-07-30 ENCOUNTER — Ambulatory Visit: Payer: MEDICAID

## 2024-08-05 ENCOUNTER — Ambulatory Visit: Payer: MEDICAID

## 2024-08-05 NOTE — Therapy (Incomplete)
 " OUTPATIENT PHYSICAL THERAPY NOTE   Patient Name: [name] MRN: [id number] DOB:14-Dec-1960, 64 y.o., female Today's Date: 08/05/2024  END OF SESSION:      Past Medical History:  Diagnosis Date   Arthritis    Cocaine abuse (HCC)    Depression    Diabetes mellitus without complication (HCC)    ETOH abuse    Hypothyroidism    Unspecified mood (affective) disorder    Past Surgical History:  Procedure Laterality Date   radioactive iodine  ablation     graves disease s/p   TOTAL KNEE ARTHROPLASTY Right 05/09/2018   Procedure: TOTAL KNEE ARTHROPLASTY;  Surgeon: Yvone Rush, MD;  Location: [facility];  Service: Orthopedics;  Laterality: Right;   TOTAL KNEE ARTHROPLASTY Left 04/27/2024   Procedure: ARTHROPLASTY, KNEE, TOTAL;  Surgeon: Yvone Rush, MD;  Location: [facility];  Service: Orthopedics;  Laterality: Left;   Patient Active Problem List   Diagnosis Date Noted   Preoperative examination 03/13/2024   Onychomycosis 11/28/2022   Controlled type 2 diabetes mellitus with complication, without long-term current use of insulin  (HCC) 08/10/2020   Bilateral primary osteoarthritis of knee 05/09/2018   Degenerative arthritis of left knee 09/12/2017   MDD (major depressive disorder), recurrent severe, without psychosis (HCC) 11/03/2016   Poor dentition 06/26/2016   Hyperlipidemia 06/26/2016   Chronic liver disease 08/29/2015   Health care maintenance 12/31/2013   Alcohol use disorder, moderate, dependence (HCC)    Primary vulvar squamous cell carcinoma s/p resection 2011 02/13/2010   Postablative hypothyroidism 04/12/2006   TOBACCO ABUSE 04/12/2006    PCP: Kandis Perkins, DO  REFERRING PROVIDER: Yvone Rush HERO  REFERRING DIAG: M17.12 (ICD-10-CM) - Unilateral primary osteoarthritis, left knee   THERAPY DIAG:  No diagnosis found.  Rationale for Evaluation and Treatment: Rehabilitation  ONSET DATE: 04/27/2024  SUBJECTIVE:   SUBJECTIVE STATEMENT: 08/05/2024 ***  no new  complaints since re certification visit last week. She was unable to attend yesterday's visit because transportation services did not arrive on time.   RECERT: Patient reports that since starting PT she has made a little bit of improvement. She notices that she still walks with her L leg turned out. She is using SPC and RW. Reporting she needs to leave by 945 today d/t transportation. She was unable to attend yesterday's visit because transportation never came to pick her up.  She does have pain with standing for awhile. She states that she has completed HEP 3x since last visit which was 1 month ago.   EVAL: Pt reports a L TKA on 04/27/2024. Prior to surgery, she was using an AD or was hopping around. Since the surgery, Rushie has been using a walker. She reports pain with most movement and has been performing exercises at home (ankle pumps). She saw her surgeon about 2 weeks ago who advised return in 1 month/4 weeks. Pt has not noticed any drainage from her incision. She denies fever and tenderness around calf.   PERTINENT HISTORY: Drug abuse, DM II, liver diease, MDD PAIN:  Are you having pain? Yes: NPRS scale: 7/10; 7/10 worst; 3/10 best Pain location: over incision Pain description: ache Aggravating factors: movement Relieving factors: ice  PRECAUTIONS: Knee  RED FLAGS: None   WEIGHT BEARING RESTRICTIONS: No  FALLS:  Has patient fallen in last 6 months? No  LIVING ENVIRONMENT: Lives with: lives with their family Lives in: House/apartment Stairs: Yes: External: 4 steps; on right going up Has following equipment at home: [name] - 2 wheeled and shower  chair  OCCUPATION: n/a  PLOF: Needs assistance with ADLs and Needs assistance with gait  PATIENT GOALS: walk without device   NEXT MD VISIT: 07/09/2024  OBJECTIVE:  Note: Objective measures were completed at Evaluation unless otherwise noted.  PATIENT SURVEYS:  LEFS   Extreme difficulty/unable (0), Quite a bit of difficulty  (1), Moderate difficulty (2), Little difficulty (3), No difficulty (4) Survey date:  05/21/2024   Any of your usual work, housework or school activities 0  2. Usual hobbies, recreational or sporting activities 1  3. Getting into/out of the bath 0  4. Walking between rooms 1  5. Putting on socks/shoes 2  6. Squatting  0  7. Lifting an object, like a bag of groceries from the floor 1  8. Performing light activities around your home 1  9. Performing heavy activities around your home 0  10. Getting into/out of a car 1  11. Walking 2 blocks 1  12. Walking 1 mile 0  13. Going up/down 10 stairs (1 flight) 0  14. Standing for 1 hour 0  15.  sitting for 1 hour 2  16. Running on even ground 1  17. Running on uneven ground 1  18. Making sharp turns while running fast 0  19. Hopping  0  20. Rolling over in bed 2  Score total:  14/80     COGNITION: Overall cognitive status: Impaired    OBSERVATION:   Arrives with walker, stocking and bandage on   SENSATION: WFL  EDEMA:  Circumferential: deferred  PALPATION: Tenderness around incision   LOWER EXTREMITY ROM:   A/PROM Right eval Left eval Left 07/16/2024   Hip flexion     Hip extension     Hip abduction     Hip adduction     Hip internal rotation     Hip external rotation     Knee flexion  90*; 95* PROM 95 AROM  Knee extension  -40*; -35* PROM -20 PROM  Ankle dorsiflexion     Ankle plantarflexion     Ankle inversion     Ankle eversion      (Blank rows = not tested)(* = pain)   LOWER EXTREMITY MMT: deferred this date due to limited ROM  MMT Right eval Left eval  Hip flexion    Hip extension    Hip abduction    Hip adduction    Hip internal rotation    Hip external rotation    Knee flexion    Knee extension    Ankle dorsiflexion    Ankle plantarflexion    Ankle inversion    Ankle eversion     (Blank rows = not tested)  FUNCTIONAL TESTS:  30 seconds chair stand test: 4.5 (ended standing) with UE  support 2 minute walk test: 101 ft with walker  GAIT: Distance walked: 45ft total  Assistive device utilized: Walker - 2 wheeled Level of assistance: SBA Comments: slow gait with majority of support for walker  TREATMENT DATE:  [facility] Adult PT Treatment:                                                DATE: 08/05/24 Therapeutic Exercise: Recumbent bike at self-selected pace x 5 minutes  Unable to maintain speed required to keep machine/screen on  SAQ 2 x 10, clinician OP  Heel slides on sliding board 2 x 10 with stretch strap for OP  Quad set with heel on 1/2 foam roll 10x5 LAQ x 10 Supine LLLD stretch over 1/2 foam roll x3' Manual:  PROM knee flexion and extension   [facility] Adult PT Treatment:                                                DATE: 07/22/2024  Therapeutic Exercise:  Recumbent bike at self-selected pace x 5 minutes  Unable to maintain speed required to keep machine/screen on  SAQ 2 x 10, clinician OP  Heel slides on sliding board 2 x 10 with stretch strap for OP  Quad set with heel on 1/2 foam roll 10x5 LAQ x 10  Manual:  PROM knee flexion and extension    [facility] Adult PT Treatment:                                                DATE: 07/16/2024  Therapeutic Activity:  Reassessment of objective measures and subjective assessment regarding progress towards established goals and updated plan for addressing remaining deficits and rehab goals.  Updated and reviewed HEP with patient education to stress importance of daily HEP adherence    PATIENT EDUCATION:  Education details: POC, HEP, diagnosis, prognosis. Person educated: Patient Education method: Explanation, Demonstration, Tactile cues, Verbal cues, and Handouts Education comprehension: verbalized understanding, returned demonstration, verbal cues required, tactile cues required, and  needs further education  HOME EXERCISE PROGRAM: Access Code: [email or web address] URL: [email or web address] Date: 07/16/2024 Prepared by: Marko Molt  Exercises - Seated Heel Slides With Towel  - 1 x daily - 7 x weekly - 2 sets - 10 reps - 3s hold - Seated Hamstring Stretch  - 2 x daily - 7 x weekly - 3 sets - 10-15s hold - Sit to Stand with Counter Support  - 1 x daily - 7 x weekly - 2 sets - 8 reps - Supine Gluteal Sets  - 2 x daily - 7 x weekly - 2 sets - 8 reps - 5s hold - Supine Quad Set  - 2 x daily - 7 x weekly - 2 sets - 8 reps - 5s hold  ASSESSMENT:  CLINICAL IMPRESSION: 08/05/2024 ***  Today's session was focused on L knee flexion and extension ROM. She continues to have significant extension lag, but has improved ROM with AROM and AAROM activities. We will continue to progress as appropriate.    Patient has attended 6 PT sessions to address L knee ROM, strength and gait deficits following L TKA. At this time she is 11 weeks post op and has significant knee extension lag, diminished knee flexion AROM, and gait deficits requires LRAD. She is demonstrating some  improvement of knee extension ROM from -30deg PROM to -20 AROM. She continues to ambulate with excessive knee flexion and out toeing during L stance phase. She requires ongoing skilled PT intervention in order to address remaining deficits and progress towards functional rehab goals. Recommended for patient to continue with PT 2x/week for 6 more weeks.    Patient is a 64 y.o. female who was seen today for physical therapy evaluation and treatment for L TKA. She had surgery on 04/27/2024. Pt has been using a walker since surgery. She has poor knee mobility at 3 and a half weeks post op. Pt is not tolerable to PROM with hard end feels. The pt will benefit from skilled physical therapy to return decrease pain and increase function.    OBJECTIVE IMPAIRMENTS: Abnormal gait, decreased activity tolerance, decreased balance,  decreased endurance, decreased mobility, difficulty walking, decreased ROM, decreased strength, hypomobility, increased edema, impaired flexibility, and pain.   ACTIVITY LIMITATIONS: lifting, bending, sitting, standing, squatting, sleeping, stairs, transfers, bed mobility, bathing, toileting, dressing, and locomotion level  PARTICIPATION LIMITATIONS: cleaning, laundry, driving, and shopping  PERSONAL FACTORS: Behavior pattern, Education, Fitness, Past/current experiences, Social background, Transportation, and 3+ comorbidities: Drug abuse, DM II, liver diease, MDD are also affecting patient's functional outcome.   REHAB POTENTIAL: Fair due to comorbidities   CLINICAL DECISION MAKING: Evolving/moderate complexity  EVALUATION COMPLEXITY: Moderate   GOALS: Goals reviewed with patient? Yes  SHORT TERM GOALS: Target date: 06/11/2024  Pt will be compliant and independent with HEP to assist with symptom management/recovery at home.  Baseline: [id number] Goal status: ONGOING  2.  Pt will achieve a 10 degree increase in AROM knee extension to assist with walking.  Baseline: -45 Goal status: MET  3.  Pt will achieve -10 degrees or greater knee extension PROM to assist with walking.  Baseline: -40 Goal status: MET   LONG TERM GOALS: Target date: 08/27/2024, last updated 07/16/2024   Pt will achieve 100 degrees AROM knee flexion to assist with stairs, dressing, and ADLs.  Baseline: 90 degrees  Goal status: ONGOING  2.  Pt will be able to walk with a SPC for 123ft.  Baseline: using walker  Goal status:MET  3.  Pt will demonstrate 4/5 or greater strength of the knee to assist with ADLs.  Baseline: less than 3/5  Goal status: ONGOING  4.  Pt will be comfortable with her final HEP in order to continue any symptom management at home and to avoid regression.   Baseline: [id number] Goal status: ONGOING   PLAN:  PT FREQUENCY: 2x/week  PT DURATION: 6 weeks last updated  07/16/2024  PLANNED INTERVENTIONS: 97110-Therapeutic exercises, 97530- Therapeutic activity, 97112- Neuromuscular re-education, 97535- Self Care, [id number]- Manual therapy, [phone]- Gait training, [phone]- Aquatic Therapy, [phone]- Vasopneumatic device, Patient/Family education, Balance training, Stair training, Joint mobilization, Scar mobilization, Cryotherapy, and Moist heat  For all possible CPT codes, reference the Planned Interventions line above.     Check all conditions that are expected to impact treatment: {Conditions expected to impact treatment:Diabetes mellitus, Psychological or psychiatric disorders, Social determinants of health, and Active major medical illness      PLAN FOR NEXT SESSION: address knee ROM, knee strengthening, hip strengthening, gait mechanics    Corean Pouch PTA  08/05/2024 8:06 AM  "

## 2024-08-06 ENCOUNTER — Ambulatory Visit: Payer: MEDICAID

## 2024-08-12 ENCOUNTER — Ambulatory Visit: Payer: MEDICAID

## 2024-08-13 ENCOUNTER — Ambulatory Visit: Payer: MEDICAID
